# Patient Record
Sex: Male | Born: 1960 | Race: White | Hispanic: No | Marital: Single | Smoking: Never smoker
Health system: Southern US, Community
[De-identification: ages and names within clinical notes are randomized; demographics above are authoritative.]

## PROBLEM LIST (undated history)

## (undated) DIAGNOSIS — K219 Gastro-esophageal reflux disease without esophagitis: Secondary | ICD-10-CM

## (undated) DIAGNOSIS — E119 Type 2 diabetes mellitus without complications: Secondary | ICD-10-CM

## (undated) DIAGNOSIS — I469 Cardiac arrest, cause unspecified: Secondary | ICD-10-CM

## (undated) DIAGNOSIS — N189 Chronic kidney disease, unspecified: Secondary | ICD-10-CM

## (undated) DIAGNOSIS — I10 Essential (primary) hypertension: Secondary | ICD-10-CM

## (undated) DIAGNOSIS — I25810 Atherosclerosis of coronary artery bypass graft(s) without angina pectoris: Secondary | ICD-10-CM

## (undated) DIAGNOSIS — I509 Heart failure, unspecified: Secondary | ICD-10-CM

## (undated) DIAGNOSIS — J449 Chronic obstructive pulmonary disease, unspecified: Secondary | ICD-10-CM

## (undated) DIAGNOSIS — N179 Acute kidney failure, unspecified: Secondary | ICD-10-CM

---

## 1998-02-07 ENCOUNTER — Encounter: Admission: RE | Admit: 1998-02-07 | Discharge: 1998-02-07 | Payer: Self-pay | Admitting: Internal Medicine

## 1998-07-31 ENCOUNTER — Encounter: Admission: RE | Admit: 1998-07-31 | Discharge: 1998-07-31 | Payer: Self-pay | Admitting: Internal Medicine

## 1998-07-31 ENCOUNTER — Ambulatory Visit (HOSPITAL_COMMUNITY): Admission: RE | Admit: 1998-07-31 | Discharge: 1998-07-31 | Payer: Self-pay | Admitting: Internal Medicine

## 1998-09-18 ENCOUNTER — Ambulatory Visit (HOSPITAL_COMMUNITY): Admission: RE | Admit: 1998-09-18 | Discharge: 1998-09-18 | Payer: Self-pay | Admitting: Internal Medicine

## 1998-09-18 ENCOUNTER — Encounter: Payer: Self-pay | Admitting: Internal Medicine

## 1998-09-18 ENCOUNTER — Encounter: Admission: RE | Admit: 1998-09-18 | Discharge: 1998-09-18 | Payer: Self-pay | Admitting: Internal Medicine

## 1998-11-28 ENCOUNTER — Emergency Department (HOSPITAL_COMMUNITY): Admission: EM | Admit: 1998-11-28 | Discharge: 1998-11-28 | Payer: Self-pay | Admitting: Emergency Medicine

## 1998-12-05 ENCOUNTER — Encounter: Admission: RE | Admit: 1998-12-05 | Discharge: 1998-12-05 | Payer: Self-pay | Admitting: Internal Medicine

## 1999-05-30 ENCOUNTER — Encounter: Admission: RE | Admit: 1999-05-30 | Discharge: 1999-05-30 | Payer: Self-pay | Admitting: Hematology and Oncology

## 1999-11-29 ENCOUNTER — Emergency Department (HOSPITAL_COMMUNITY): Admission: EM | Admit: 1999-11-29 | Discharge: 1999-11-29 | Payer: Self-pay | Admitting: Emergency Medicine

## 2000-11-26 ENCOUNTER — Emergency Department (HOSPITAL_COMMUNITY): Admission: EM | Admit: 2000-11-26 | Discharge: 2000-11-26 | Payer: Self-pay

## 2000-12-19 ENCOUNTER — Emergency Department (HOSPITAL_COMMUNITY): Admission: EM | Admit: 2000-12-19 | Discharge: 2000-12-19 | Payer: Self-pay | Admitting: Emergency Medicine

## 2001-04-11 ENCOUNTER — Emergency Department (HOSPITAL_COMMUNITY): Admission: EM | Admit: 2001-04-11 | Discharge: 2001-04-11 | Payer: Self-pay | Admitting: *Deleted

## 2001-05-01 ENCOUNTER — Emergency Department (HOSPITAL_COMMUNITY): Admission: EM | Admit: 2001-05-01 | Discharge: 2001-05-01 | Payer: Self-pay | Admitting: Emergency Medicine

## 2001-05-10 ENCOUNTER — Emergency Department (HOSPITAL_COMMUNITY): Admission: EM | Admit: 2001-05-10 | Discharge: 2001-05-10 | Payer: Self-pay | Admitting: Emergency Medicine

## 2001-06-22 ENCOUNTER — Emergency Department (HOSPITAL_COMMUNITY): Admission: EM | Admit: 2001-06-22 | Discharge: 2001-06-22 | Payer: Self-pay | Admitting: Emergency Medicine

## 2003-01-05 ENCOUNTER — Encounter: Admission: RE | Admit: 2003-01-05 | Discharge: 2003-01-05 | Payer: Self-pay | Admitting: Urology

## 2003-01-05 ENCOUNTER — Encounter: Payer: Self-pay | Admitting: Urology

## 2003-01-06 ENCOUNTER — Ambulatory Visit (HOSPITAL_BASED_OUTPATIENT_CLINIC_OR_DEPARTMENT_OTHER): Admission: RE | Admit: 2003-01-06 | Discharge: 2003-01-06 | Payer: Self-pay | Admitting: Urology

## 2003-01-06 ENCOUNTER — Encounter: Payer: Self-pay | Admitting: Urology

## 2003-06-24 ENCOUNTER — Emergency Department (HOSPITAL_COMMUNITY): Admission: EM | Admit: 2003-06-24 | Discharge: 2003-06-25 | Payer: Self-pay | Admitting: Emergency Medicine

## 2003-06-25 ENCOUNTER — Encounter: Payer: Self-pay | Admitting: Emergency Medicine

## 2004-12-20 ENCOUNTER — Ambulatory Visit (HOSPITAL_COMMUNITY): Admission: RE | Admit: 2004-12-20 | Discharge: 2004-12-21 | Payer: Self-pay | Admitting: Neurological Surgery

## 2005-01-15 ENCOUNTER — Encounter: Admission: RE | Admit: 2005-01-15 | Discharge: 2005-01-15 | Payer: Self-pay | Admitting: Neurological Surgery

## 2005-01-18 ENCOUNTER — Emergency Department (HOSPITAL_COMMUNITY): Admission: EM | Admit: 2005-01-18 | Discharge: 2005-01-18 | Payer: Self-pay | Admitting: Emergency Medicine

## 2005-02-12 ENCOUNTER — Encounter: Admission: RE | Admit: 2005-02-12 | Discharge: 2005-02-12 | Payer: Self-pay | Admitting: Neurological Surgery

## 2012-06-30 ENCOUNTER — Other Ambulatory Visit: Payer: Self-pay | Admitting: Family Medicine

## 2014-07-11 ENCOUNTER — Inpatient Hospital Stay: Admit: 2014-07-11 | Payer: Self-pay | Admitting: Internal Medicine

## 2014-07-11 ENCOUNTER — Inpatient Hospital Stay (HOSPITAL_COMMUNITY)
Admission: EM | Admit: 2014-07-11 | Discharge: 2014-07-26 | DRG: 004 | Disposition: A | Discharge status: Rehabilitation Facility | Payer: Medicaid Other | Source: Other Acute Inpatient Hospital | Attending: Pulmonary Disease | Admitting: Pulmonary Disease

## 2014-07-11 ENCOUNTER — Inpatient Hospital Stay (HOSPITAL_COMMUNITY): Payer: Medicaid Other

## 2014-07-11 ENCOUNTER — Encounter (HOSPITAL_COMMUNITY): Payer: Self-pay | Admitting: Internal Medicine

## 2014-07-11 DIAGNOSIS — J189 Pneumonia, unspecified organism: Secondary | ICD-10-CM | POA: Diagnosis not present

## 2014-07-11 DIAGNOSIS — E785 Hyperlipidemia, unspecified: Secondary | ICD-10-CM | POA: Diagnosis not present

## 2014-07-11 DIAGNOSIS — R570 Cardiogenic shock: Secondary | ICD-10-CM | POA: Diagnosis not present

## 2014-07-11 DIAGNOSIS — Z951 Presence of aortocoronary bypass graft: Secondary | ICD-10-CM

## 2014-07-11 DIAGNOSIS — D649 Anemia, unspecified: Secondary | ICD-10-CM | POA: Diagnosis not present

## 2014-07-11 DIAGNOSIS — Z430 Encounter for attention to tracheostomy: Secondary | ICD-10-CM

## 2014-07-11 DIAGNOSIS — J9600 Acute respiratory failure, unspecified whether with hypoxia or hypercapnia: Secondary | ICD-10-CM | POA: Diagnosis not present

## 2014-07-11 DIAGNOSIS — I2510 Atherosclerotic heart disease of native coronary artery without angina pectoris: Secondary | ICD-10-CM | POA: Diagnosis not present

## 2014-07-11 DIAGNOSIS — Z6841 Body Mass Index (BMI) 40.0 and over, adult: Secondary | ICD-10-CM

## 2014-07-11 DIAGNOSIS — Z8249 Family history of ischemic heart disease and other diseases of the circulatory system: Secondary | ICD-10-CM

## 2014-07-11 DIAGNOSIS — I5031 Acute diastolic (congestive) heart failure: Secondary | ICD-10-CM

## 2014-07-11 DIAGNOSIS — E108 Type 1 diabetes mellitus with unspecified complications: Secondary | ICD-10-CM

## 2014-07-11 DIAGNOSIS — K219 Gastro-esophageal reflux disease without esophagitis: Secondary | ICD-10-CM

## 2014-07-11 DIAGNOSIS — G9341 Metabolic encephalopathy: Secondary | ICD-10-CM | POA: Diagnosis not present

## 2014-07-11 DIAGNOSIS — I10 Essential (primary) hypertension: Secondary | ICD-10-CM

## 2014-07-11 DIAGNOSIS — I509 Heart failure, unspecified: Secondary | ICD-10-CM

## 2014-07-11 DIAGNOSIS — E1149 Type 2 diabetes mellitus with other diabetic neurological complication: Secondary | ICD-10-CM | POA: Diagnosis not present

## 2014-07-11 DIAGNOSIS — R4182 Altered mental status, unspecified: Secondary | ICD-10-CM | POA: Diagnosis present

## 2014-07-11 DIAGNOSIS — I469 Cardiac arrest, cause unspecified: Secondary | ICD-10-CM

## 2014-07-11 DIAGNOSIS — I129 Hypertensive chronic kidney disease with stage 1 through stage 4 chronic kidney disease, or unspecified chronic kidney disease: Secondary | ICD-10-CM | POA: Diagnosis present

## 2014-07-11 DIAGNOSIS — N182 Chronic kidney disease, stage 2 (mild): Secondary | ICD-10-CM | POA: Diagnosis not present

## 2014-07-11 DIAGNOSIS — E1169 Type 2 diabetes mellitus with other specified complication: Secondary | ICD-10-CM | POA: Diagnosis present

## 2014-07-11 DIAGNOSIS — I252 Old myocardial infarction: Secondary | ICD-10-CM

## 2014-07-11 DIAGNOSIS — I4901 Ventricular fibrillation: Secondary | ICD-10-CM | POA: Diagnosis not present

## 2014-07-11 DIAGNOSIS — I2789 Other specified pulmonary heart diseases: Secondary | ICD-10-CM | POA: Diagnosis not present

## 2014-07-11 DIAGNOSIS — G9340 Encephalopathy, unspecified: Secondary | ICD-10-CM

## 2014-07-11 DIAGNOSIS — E119 Type 2 diabetes mellitus without complications: Secondary | ICD-10-CM

## 2014-07-11 DIAGNOSIS — F17200 Nicotine dependence, unspecified, uncomplicated: Secondary | ICD-10-CM | POA: Diagnosis present

## 2014-07-11 DIAGNOSIS — I5021 Acute systolic (congestive) heart failure: Secondary | ICD-10-CM

## 2014-07-11 DIAGNOSIS — E139 Other specified diabetes mellitus without complications: Secondary | ICD-10-CM

## 2014-07-11 DIAGNOSIS — R1310 Dysphagia, unspecified: Secondary | ICD-10-CM | POA: Diagnosis not present

## 2014-07-11 DIAGNOSIS — Z794 Long term (current) use of insulin: Secondary | ICD-10-CM

## 2014-07-11 DIAGNOSIS — N189 Chronic kidney disease, unspecified: Secondary | ICD-10-CM

## 2014-07-11 DIAGNOSIS — N179 Acute kidney failure, unspecified: Secondary | ICD-10-CM | POA: Diagnosis present

## 2014-07-11 DIAGNOSIS — I25810 Atherosclerosis of coronary artery bypass graft(s) without angina pectoris: Secondary | ICD-10-CM

## 2014-07-11 DIAGNOSIS — E875 Hyperkalemia: Secondary | ICD-10-CM | POA: Diagnosis not present

## 2014-07-11 DIAGNOSIS — E669 Obesity, unspecified: Secondary | ICD-10-CM | POA: Diagnosis present

## 2014-07-11 DIAGNOSIS — R0681 Apnea, not elsewhere classified: Secondary | ICD-10-CM | POA: Diagnosis not present

## 2014-07-11 DIAGNOSIS — J9601 Acute respiratory failure with hypoxia: Secondary | ICD-10-CM

## 2014-07-11 DIAGNOSIS — J449 Chronic obstructive pulmonary disease, unspecified: Secondary | ICD-10-CM

## 2014-07-11 DIAGNOSIS — R061 Stridor: Secondary | ICD-10-CM

## 2014-07-11 DIAGNOSIS — J438 Other emphysema: Secondary | ICD-10-CM

## 2014-07-11 DIAGNOSIS — J4489 Other specified chronic obstructive pulmonary disease: Secondary | ICD-10-CM | POA: Diagnosis present

## 2014-07-11 DIAGNOSIS — T8589XD Other specified complication of internal prosthetic devices, implants and grafts, not elsewhere classified, subsequent encounter: Secondary | ICD-10-CM

## 2014-07-11 DIAGNOSIS — IMO0001 Reserved for inherently not codable concepts without codable children: Secondary | ICD-10-CM

## 2014-07-11 DIAGNOSIS — E870 Hyperosmolality and hypernatremia: Secondary | ICD-10-CM

## 2014-07-11 HISTORY — DX: Gastro-esophageal reflux disease without esophagitis: K21.9

## 2014-07-11 HISTORY — DX: Type 2 diabetes mellitus without complications: E11.9

## 2014-07-11 HISTORY — DX: Chronic kidney disease, unspecified: N18.9

## 2014-07-11 HISTORY — DX: Heart failure, unspecified: I50.9

## 2014-07-11 HISTORY — DX: Essential (primary) hypertension: I10

## 2014-07-11 HISTORY — DX: Atherosclerosis of coronary artery bypass graft(s) without angina pectoris: I25.810

## 2014-07-11 HISTORY — DX: Chronic obstructive pulmonary disease, unspecified: J44.9

## 2014-07-11 HISTORY — DX: Acute kidney failure, unspecified: N17.9

## 2014-07-11 HISTORY — DX: Cardiac arrest, cause unspecified: I46.9

## 2014-07-11 LAB — CBC
HEMATOCRIT: 42.7 % (ref 39.0–52.0)
HEMATOCRIT: 43.7 % (ref 39.0–52.0)
HEMOGLOBIN: 13.5 g/dL (ref 13.0–17.0)
Hemoglobin: 13.8 g/dL (ref 13.0–17.0)
MCH: 28.3 pg (ref 26.0–34.0)
MCH: 28.7 pg (ref 26.0–34.0)
MCHC: 31.6 g/dL (ref 30.0–36.0)
MCHC: 31.6 g/dL (ref 30.0–36.0)
MCV: 89.5 fL (ref 78.0–100.0)
MCV: 90.9 fL (ref 78.0–100.0)
Platelets: 389 10*3/uL (ref 150–400)
Platelets: 374 10*3/uL (ref 150–400)
RBC: 4.77 MIL/uL (ref 4.22–5.81)
RBC: 4.81 MIL/uL (ref 4.22–5.81)
RDW: 17.5 % — ABNORMAL HIGH (ref 11.5–15.5)
RDW: 17.7 % — ABNORMAL HIGH (ref 11.5–15.5)
WBC: 13.8 10*3/uL — ABNORMAL HIGH (ref 4.0–10.5)
WBC: 19 10*3/uL — ABNORMAL HIGH (ref 4.0–10.5)

## 2014-07-11 LAB — URINALYSIS, ROUTINE W REFLEX MICROSCOPIC
Bilirubin Urine: NEGATIVE
GLUCOSE, UA: NEGATIVE mg/dL
KETONES UR: NEGATIVE mg/dL
Nitrite: NEGATIVE
PH: 5 (ref 5.0–8.0)
Protein, ur: 30 mg/dL — AB
Specific Gravity, Urine: 1.021 (ref 1.005–1.030)
Urobilinogen, UA: 0.2 mg/dL (ref 0.0–1.0)

## 2014-07-11 LAB — AMMONIA: AMMONIA: 39 umol/L (ref 11–60)

## 2014-07-11 LAB — BASIC METABOLIC PANEL
ANION GAP: 19 — AB (ref 5–15)
Anion gap: 17 — ABNORMAL HIGH (ref 5–15)
Anion gap: 20 — ABNORMAL HIGH (ref 5–15)
BUN: 85 mg/dL — AB (ref 6–23)
BUN: 83 mg/dL — ABNORMAL HIGH (ref 6–23)
BUN: 83 mg/dL — AB (ref 6–23)
CALCIUM: 7.7 mg/dL — AB (ref 8.4–10.5)
CO2: 24 meq/L (ref 19–32)
CO2: 21 meq/L (ref 19–32)
CO2: 22 meq/L (ref 19–32)
CREATININE: 4.74 mg/dL — AB (ref 0.50–1.35)
CREATININE: 4.87 mg/dL — AB (ref 0.50–1.35)
Calcium: 7.7 mg/dL — ABNORMAL LOW (ref 8.4–10.5)
Calcium: 7.7 mg/dL — ABNORMAL LOW (ref 8.4–10.5)
Chloride: 96 mEq/L (ref 96–112)
Chloride: 95 mEq/L — ABNORMAL LOW (ref 96–112)
Chloride: 98 mEq/L (ref 96–112)
Creatinine, Ser: 5.29 mg/dL — ABNORMAL HIGH (ref 0.50–1.35)
GFR calc Af Amer: 13 mL/min — ABNORMAL LOW (ref >90)
GFR calc Af Amer: 15 mL/min — ABNORMAL LOW (ref >90)
GFR calc non Af Amer: 13 mL/min — ABNORMAL LOW (ref >90)
GFR calc non Af Amer: 12 mL/min — ABNORMAL LOW (ref >90)
GFR, EST AFRICAN AMERICAN: 14 mL/min — AB (ref >90)
GFR, EST NON AFRICAN AMERICAN: 11 mL/min — AB (ref >90)
GLUCOSE: 162 mg/dL — AB (ref 70–99)
Glucose, Bld: 221 mg/dL — ABNORMAL HIGH (ref 70–99)
Glucose, Bld: 192 mg/dL — ABNORMAL HIGH (ref 70–99)
Potassium: 5.9 mEq/L — ABNORMAL HIGH (ref 3.7–5.3)
Potassium: 5.9 mEq/L — ABNORMAL HIGH (ref 3.7–5.3)
Potassium: 5.2 mEq/L (ref 3.7–5.3)
SODIUM: 137 meq/L (ref 137–147)
Sodium: 136 mEq/L — ABNORMAL LOW (ref 137–147)
Sodium: 139 mEq/L (ref 137–147)

## 2014-07-11 LAB — GLUCOSE, CAPILLARY
GLUCOSE-CAPILLARY: 206 mg/dL — AB (ref 70–99)
Glucose-Capillary: 166 mg/dL — ABNORMAL HIGH (ref 70–99)
Glucose-Capillary: 212 mg/dL — ABNORMAL HIGH (ref 70–99)
Glucose-Capillary: 173 mg/dL — ABNORMAL HIGH (ref 70–99)

## 2014-07-11 LAB — COMPREHENSIVE METABOLIC PANEL
ALT: 39 U/L (ref 0–53)
AST: 24 U/L (ref 0–37)
Albumin: 3.4 g/dL — ABNORMAL LOW (ref 3.5–5.2)
Alkaline Phosphatase: 113 U/L (ref 39–117)
Anion gap: 19 — ABNORMAL HIGH (ref 5–15)
BUN: 82 mg/dL — ABNORMAL HIGH (ref 6–23)
CALCIUM: 8.9 mg/dL (ref 8.4–10.5)
CO2: 24 meq/L (ref 19–32)
CREATININE: 5.33 mg/dL — AB (ref 0.50–1.35)
Chloride: 94 mEq/L — ABNORMAL LOW (ref 96–112)
GFR, EST AFRICAN AMERICAN: 13 mL/min — AB (ref >90)
GFR, EST NON AFRICAN AMERICAN: 11 mL/min — AB (ref >90)
Glucose, Bld: 232 mg/dL — ABNORMAL HIGH (ref 70–99)
Potassium: 4.7 mEq/L (ref 3.7–5.3)
Sodium: 137 mEq/L (ref 137–147)
Total Bilirubin: 0.3 mg/dL (ref 0.3–1.2)
Total Protein: 6.5 g/dL (ref 6.0–8.3)

## 2014-07-11 LAB — LACTIC ACID, PLASMA: Lactic Acid, Venous: 3.1 mmol/L — ABNORMAL HIGH (ref 0.5–2.2)

## 2014-07-11 LAB — POCT I-STAT 3, ART BLOOD GAS (G3+)
ACID-BASE DEFICIT: 5 mmol/L — AB (ref 0.0–2.0)
BICARBONATE: 21.9 meq/L (ref 20.0–24.0)
O2 Saturation: 99 %
PO2 ART: 181 mmHg — AB (ref 80.0–100.0)
Patient temperature: 98.6
TCO2: 23 mmol/L (ref 0–100)
pCO2 arterial: 46.9 mmHg — ABNORMAL HIGH (ref 35.0–45.0)
pH, Arterial: 7.278 — ABNORMAL LOW (ref 7.350–7.450)

## 2014-07-11 LAB — URINE MICROSCOPIC-ADD ON

## 2014-07-11 LAB — CARBOXYHEMOGLOBIN
Carboxyhemoglobin: 1.9 % — ABNORMAL HIGH (ref 0.5–1.5)
METHEMOGLOBIN: 0.9 % (ref 0.0–1.5)
O2 Saturation: 92 %
TOTAL HEMOGLOBIN: 13.6 g/dL (ref 13.5–18.0)

## 2014-07-11 LAB — PROTIME-INR
INR: 1.1 (ref 0.00–1.49)
Prothrombin Time: 14.2 seconds (ref 11.6–15.2)

## 2014-07-11 LAB — MRSA PCR SCREENING: MRSA BY PCR: NEGATIVE

## 2014-07-11 LAB — POTASSIUM: POTASSIUM: 5.9 meq/L — AB (ref 3.7–5.3)

## 2014-07-11 LAB — TROPONIN I
TROPONIN I: 0.43 ng/mL — AB (ref <0.30)
Troponin I: 0.64 ng/mL (ref <0.30)
Troponin I: 1.28 ng/mL (ref <0.30)

## 2014-07-11 LAB — APTT: APTT: 38 s — AB (ref 24–37)

## 2014-07-11 LAB — PROCALCITONIN: Procalcitonin: 0.42 ng/mL

## 2014-07-11 MED ORDER — CETYLPYRIDINIUM CHLORIDE 0.05 % MT LIQD: 7.0000 mL | Freq: Four times a day (QID) | OROMUCOSAL | Status: DC

## 2014-07-11 MED ORDER — FENTANYL BOLUS VIA INFUSION
50.0000 ug | INTRAVENOUS | Status: DC | PRN
  Filled 2014-07-11: qty 50

## 2014-07-11 MED ORDER — ATORVASTATIN CALCIUM 20 MG PO TABS
20.0000 mg | ORAL_TABLET | Freq: Every day | ORAL | Status: DC
  Administered 2014-07-11 – 2014-07-14 (×4): 20 mg
  Filled 2014-07-11 (×5): qty 1

## 2014-07-11 MED ORDER — SODIUM CHLORIDE 0.9 % IV SOLN
25.0000 ug/h | INTRAVENOUS | Status: DC
  Administered 2014-07-11 – 2014-07-14 (×2): 50 ug/h via INTRAVENOUS
  Filled 2014-07-11 (×3): qty 50

## 2014-07-11 MED ORDER — FLUTICASONE PROPIONATE HFA 110 MCG/ACT IN AERO
2.0000 | INHALATION_SPRAY | Freq: Two times a day (BID) | RESPIRATORY_TRACT | Status: DC
  Filled 2014-07-11: qty 12

## 2014-07-11 MED ORDER — ALBUTEROL SULFATE (2.5 MG/3ML) 0.083% IN NEBU
2.5000 mg | INHALATION_SOLUTION | Freq: Four times a day (QID) | RESPIRATORY_TRACT | Status: DC
  Administered 2014-07-11 – 2014-07-20 (×36): 2.5 mg via RESPIRATORY_TRACT
  Filled 2014-07-11 (×36): qty 3

## 2014-07-11 MED ORDER — SODIUM CHLORIDE 0.9 % IV SOLN: 250.0000 mL | INTRAVENOUS | Status: DC | PRN

## 2014-07-11 MED ORDER — DOPAMINE-DEXTROSE 3.2-5 MG/ML-% IV SOLN
2.0000 ug/kg/min | INTRAVENOUS | Status: DC
  Administered 2014-07-11: 10 ug/kg/min via INTRAVENOUS
  Administered 2014-07-12: 4.5 ug/kg/min via INTRAVENOUS
  Filled 2014-07-11: qty 250

## 2014-07-11 MED ORDER — INSULIN ASPART 100 UNIT/ML ~~LOC~~ SOLN
0.0000 [IU] | Freq: Three times a day (TID) | SUBCUTANEOUS | Status: DC
  Administered 2014-07-11: 4 [IU] via SUBCUTANEOUS

## 2014-07-11 MED ORDER — FUROSEMIDE 10 MG/ML IJ SOLN: 80.0000 mg | Freq: Once | INTRAMUSCULAR | Status: DC

## 2014-07-11 MED ORDER — HEPARIN SODIUM (PORCINE) 5000 UNIT/ML IJ SOLN: 5000.0000 [IU] | Freq: Three times a day (TID) | INTRAMUSCULAR | Status: DC

## 2014-07-11 MED ORDER — DOPAMINE-DEXTROSE 3.2-5 MG/ML-% IV SOLN
INTRAVENOUS | Status: AC
  Administered 2014-07-11: 10 ug/kg/min via INTRAVENOUS
  Filled 2014-07-11: qty 250

## 2014-07-11 MED ORDER — ACETAMINOPHEN 325 MG PO TABS: 650.0000 mg | ORAL_TABLET | Freq: Four times a day (QID) | ORAL | Status: DC | PRN

## 2014-07-11 MED ORDER — CHLORHEXIDINE GLUCONATE 0.12 % MT SOLN: 15.0000 mL | Freq: Two times a day (BID) | OROMUCOSAL | Status: DC

## 2014-07-11 MED ORDER — SODIUM POLYSTYRENE SULFONATE 15 GM/60ML PO SUSP
30.0000 g | Freq: Once | ORAL | Status: AC
  Administered 2014-07-11: 30 g
  Filled 2014-07-11: qty 120

## 2014-07-11 MED ORDER — FENTANYL CITRATE 0.05 MG/ML IJ SOLN: 100.0000 ug | Freq: Once | INTRAMUSCULAR | Status: AC | PRN

## 2014-07-11 MED ORDER — FUROSEMIDE 40 MG PO TABS
40.0000 mg | ORAL_TABLET | Freq: Two times a day (BID) | ORAL | Status: DC
  Filled 2014-07-11 (×3): qty 1

## 2014-07-11 MED ORDER — SODIUM CHLORIDE 0.9 % IV SOLN
INTRAVENOUS | Status: DC
  Administered 2014-07-11: 10 mL/h via INTRAVENOUS
  Administered 2014-07-15: 20 mL/h via INTRAVENOUS
  Administered 2014-07-16 – 2014-07-17 (×2): 15 mL/h via INTRAVENOUS

## 2014-07-11 MED ORDER — DEXTROSE 5 % IV SOLN
1.0000 g | INTRAVENOUS | Status: AC
  Administered 2014-07-11 – 2014-07-18 (×8): 1 g via INTRAVENOUS
  Filled 2014-07-11 (×8): qty 10

## 2014-07-11 MED ORDER — DOXYCYCLINE HYCLATE 100 MG IV SOLR
100.0000 mg | Freq: Two times a day (BID) | INTRAVENOUS | Status: DC
  Administered 2014-07-11 – 2014-07-15 (×9): 100 mg via INTRAVENOUS
  Filled 2014-07-11 (×10): qty 100

## 2014-07-11 MED ORDER — ASPIRIN EC 81 MG PO TBEC
81.0000 mg | DELAYED_RELEASE_TABLET | Freq: Every day | ORAL | Status: DC
  Filled 2014-07-11: qty 1

## 2014-07-11 MED ORDER — SODIUM CHLORIDE 0.9 % IV SOLN
2000.0000 mL | Freq: Once | INTRAVENOUS | Status: AC
  Administered 2014-07-11: 1000 mL via INTRAVENOUS

## 2014-07-11 MED ORDER — FENTANYL CITRATE 0.05 MG/ML IJ SOLN
INTRAMUSCULAR | Status: AC
  Administered 2014-07-11: 100 ug via INTRAVENOUS
  Filled 2014-07-11: qty 2

## 2014-07-11 MED ORDER — CHLORHEXIDINE GLUCONATE 0.12 % MT SOLN
15.0000 mL | Freq: Two times a day (BID) | OROMUCOSAL | Status: DC
  Administered 2014-07-11 – 2014-07-26 (×31): 15 mL via OROMUCOSAL
  Filled 2014-07-11 (×35): qty 15

## 2014-07-11 MED ORDER — SODIUM CHLORIDE 0.9 % IJ SOLN: 3.0000 mL | Freq: Two times a day (BID) | INTRAMUSCULAR | Status: DC

## 2014-07-11 MED ORDER — MIDAZOLAM BOLUS VIA INFUSION
2.0000 mg | INTRAVENOUS | Status: DC | PRN
  Filled 2014-07-11: qty 2

## 2014-07-11 MED ORDER — AMLODIPINE BESYLATE 10 MG PO TABS
10.0000 mg | ORAL_TABLET | Freq: Every day | ORAL | Status: DC
  Filled 2014-07-11: qty 1

## 2014-07-11 MED ORDER — SODIUM CHLORIDE 0.9 % IV SOLN: 25.0000 ug/h | INTRAVENOUS | Status: DC

## 2014-07-11 MED ORDER — MIDAZOLAM HCL 5 MG/ML IJ SOLN: 2.0000 mg | Freq: Once | INTRAMUSCULAR | Status: AC | PRN

## 2014-07-11 MED ORDER — CETYLPYRIDINIUM CHLORIDE 0.05 % MT LIQD
7.0000 mL | Freq: Four times a day (QID) | OROMUCOSAL | Status: DC
  Administered 2014-07-11 – 2014-07-26 (×62): 7 mL via OROMUCOSAL

## 2014-07-11 MED ORDER — ACETAMINOPHEN 650 MG RE SUPP: 650.0000 mg | Freq: Four times a day (QID) | RECTAL | Status: DC | PRN

## 2014-07-11 MED ORDER — ARTIFICIAL TEARS OP OINT: 1.0000 "application " | TOPICAL_OINTMENT | Freq: Three times a day (TID) | OPHTHALMIC | Status: DC

## 2014-07-11 MED ORDER — INSULIN GLARGINE 100 UNIT/ML ~~LOC~~ SOLN
50.0000 [IU] | Freq: Two times a day (BID) | SUBCUTANEOUS | Status: DC
  Filled 2014-07-11 (×2): qty 0.5

## 2014-07-11 MED ORDER — LEVOFLOXACIN IN D5W 750 MG/150ML IV SOLN
750.0000 mg | Freq: Once | INTRAVENOUS | Status: DC
  Filled 2014-07-11: qty 150

## 2014-07-11 MED ORDER — HEPARIN SODIUM (PORCINE) 5000 UNIT/ML IJ SOLN
5000.0000 [IU] | Freq: Three times a day (TID) | INTRAMUSCULAR | Status: DC
  Administered 2014-07-11 – 2014-07-16 (×14): 5000 [IU] via SUBCUTANEOUS
  Filled 2014-07-11 (×18): qty 1

## 2014-07-11 MED ORDER — INSULIN ASPART 100 UNIT/ML ~~LOC~~ SOLN
0.0000 [IU] | SUBCUTANEOUS | Status: DC
  Administered 2014-07-11: 3 [IU] via SUBCUTANEOUS
  Administered 2014-07-11 (×2): 5 [IU] via SUBCUTANEOUS
  Administered 2014-07-12 (×5): 3 [IU] via SUBCUTANEOUS
  Administered 2014-07-12: 8 [IU] via SUBCUTANEOUS
  Administered 2014-07-12: 5 [IU] via SUBCUTANEOUS
  Administered 2014-07-13: 8 [IU] via SUBCUTANEOUS

## 2014-07-11 MED ORDER — SODIUM CHLORIDE 0.9 % IV SOLN: 1.0000 mg/h | INTRAVENOUS | Status: DC

## 2014-07-11 MED ORDER — TIOTROPIUM BROMIDE MONOHYDRATE 18 MCG IN CAPS
18.0000 ug | ORAL_CAPSULE | Freq: Every day | RESPIRATORY_TRACT | Status: DC
  Filled 2014-07-11: qty 5

## 2014-07-11 MED ORDER — SODIUM CHLORIDE 0.9 % IJ SOLN: 3.0000 mL | INTRAMUSCULAR | Status: DC | PRN

## 2014-07-11 MED ORDER — SODIUM CHLORIDE 0.9 % IV SOLN
1.0000 ug/kg/min | INTRAVENOUS | Status: DC
  Filled 2014-07-11: qty 20

## 2014-07-11 MED ORDER — ATORVASTATIN CALCIUM 20 MG PO TABS
20.0000 mg | ORAL_TABLET | Freq: Every day | ORAL | Status: DC
  Filled 2014-07-11: qty 1

## 2014-07-11 MED ORDER — SODIUM CHLORIDE 0.9 % IV SOLN
1.0000 mg/h | INTRAVENOUS | Status: DC
  Filled 2014-07-11: qty 10

## 2014-07-11 MED ORDER — LEVOFLOXACIN IN D5W 750 MG/150ML IV SOLN
750.0000 mg | INTRAVENOUS | Status: DC
  Filled 2014-07-11: qty 150

## 2014-07-11 MED ORDER — HEPARIN SODIUM (PORCINE) 5000 UNIT/ML IJ SOLN
5000.0000 [IU] | Freq: Three times a day (TID) | INTRAMUSCULAR | Status: DC
  Administered 2014-07-11: 5000 [IU] via SUBCUTANEOUS
  Filled 2014-07-11: qty 1

## 2014-07-11 MED ORDER — CISATRACURIUM BOLUS VIA INFUSION
0.0500 mg/kg | INTRAVENOUS | Status: DC | PRN
Start: 2014-07-11 — End: 2014-07-11
  Filled 2014-07-11: qty 6

## 2014-07-11 MED ORDER — ASPIRIN 300 MG RE SUPP
300.0000 mg | RECTAL | Status: AC
  Administered 2014-07-11: 300 mg via RECTAL
  Filled 2014-07-11: qty 1

## 2014-07-11 MED ORDER — PANTOPRAZOLE SODIUM 40 MG IV SOLR
40.0000 mg | Freq: Every day | INTRAVENOUS | Status: DC
  Administered 2014-07-11 – 2014-07-16 (×6): 40 mg via INTRAVENOUS
  Filled 2014-07-11 (×8): qty 40

## 2014-07-11 MED ORDER — NOREPINEPHRINE BITARTRATE 1 MG/ML IV SOLN
0.5000 ug/min | INTRAVENOUS | Status: DC
  Administered 2014-07-11: 2 ug/min via INTRAVENOUS
  Filled 2014-07-11: qty 4

## 2014-07-11 MED ORDER — MIDAZOLAM HCL 2 MG/2ML IJ SOLN: 2.0000 mg | Freq: Once | INTRAMUSCULAR | Status: DC

## 2014-07-11 MED ORDER — METOPROLOL SUCCINATE ER 50 MG PO TB24
50.0000 mg | ORAL_TABLET | Freq: Every day | ORAL | Status: DC
  Filled 2014-07-11: qty 1

## 2014-07-11 MED ORDER — PREGABALIN 75 MG PO CAPS: 75.0000 mg | ORAL_CAPSULE | Freq: Three times a day (TID) | ORAL | Status: DC

## 2014-07-11 MED ORDER — FENTANYL CITRATE 0.05 MG/ML IJ SOLN
100.0000 ug | Freq: Once | INTRAMUSCULAR | Status: AC
  Administered 2014-07-11: 100 ug via INTRAVENOUS

## 2014-07-11 MED ORDER — CISATRACURIUM BOLUS VIA INFUSION
0.1000 mg/kg | Freq: Once | INTRAVENOUS | Status: DC
  Filled 2014-07-11: qty 11

## 2014-07-11 MED ORDER — GEMFIBROZIL 600 MG PO TABS
600.0000 mg | ORAL_TABLET | Freq: Two times a day (BID) | ORAL | Status: DC
  Filled 2014-07-11 (×3): qty 1

## 2014-07-11 MED FILL — Medication: Qty: 1 | Status: AC

## 2014-07-11 NOTE — Progress Notes (Addendum)
Pt down in Echo. Call received from central tele about pt rhythm asystole. Call from echo lab stating they were calling a code on the pt. Nursing went to assist in code.

## 2014-07-11 NOTE — Progress Notes (Signed)
07/11/14 0900  Clinical Encounter Type  Visited With Patient;Health care provider  Visit Type Code  Spiritual Encounters  Spiritual Needs Emotional  Stress Factors  Patient Stress Factors None identified   Chaplain responded to code blue at roughly 9:00 AM. When chaplain arrived, patient was being worked on extensively by medical team. Chaplain provided emotional support to medical team. Patient was not able to communicate at this time. Chaplain spoke to patient's primary RN at the time. She indicated that she had tried to reach patient's primary contact by phone but was unsuccessful. Chaplain made a visit to room where patient was transferred. Chaplain made a second attempt to contact patient's primary contact with no success. Chaplain will follow-up with patient when appropriate. Delma Freeze R 9:30 AM

## 2014-07-11 NOTE — Progress Notes (Signed)
PT Cancellation Note  Patient Details Name: Richard [name] MRN: [id number] DOB: [birth year]   Cancelled Treatment:   PT attempted to see pt, who is extremely lethargic and unable to stay awake to sit up.  Nsg has just received him and may try again later today.     Ivar Drape 07/11/2014, 8:33 AM  Samul Dada, PT MS Acute Rehab Dept. Number: [phone]

## 2014-07-11 NOTE — Progress Notes (Signed)
  Code Blue Note.  Responded to Code Blue in Echo lab.   On my arrival, patient apneic and pulseless. CPR in progress. On monitor initial rhythm I saw was VF. He received multiple shocks  and 3 doses of epinephrine, 2 bicarbonate, calcium and amiodarone 300. Was transiently in asystole and then VT for which he was shocked again. He had continous CPR. RT and CCM responded and were able to obtain an airway. The patient then had return to normal rhythm with strong pulses. Total resuscitation time 15-20 mins.   I then helped transport him to CCU.  The patient is critically ill with multiple organ systems failure and requires high complexity decision making for assessment and support, frequent evaluation and titration of therapies, application of advanced monitoring technologies and extensive interpretation of multiple databases.   Critical Care Time devoted to patient care services described in this note is 35 Minutes.   Kaien Pezzullo,MD 2:03 PM

## 2014-07-11 NOTE — Progress Notes (Signed)
53yo male from [facility] to begin tx for CAP.  Will give Levaquin  IV x1 and f/u renal function for further dosing.  Vernard Gambles, PharmD, BCPS 07/11/2014 7:10 AM

## 2014-07-11 NOTE — Progress Notes (Signed)
   K still at 5.9 Creat improved to 4.7 but ur op marginal per RN   Plan Kayexalate 30g x 1  Repeat bmet at 23.30  Dr. Kalman Shan, M.D., [facility].C.P Pulmonary and Critical Care Medicine Staff Physician [place] [facility] [place] [facility] Pager: [phone], If no answer or between  15:00h - 7:00h: call [phone]  07/11/2014 7:03 PM

## 2014-07-11 NOTE — Procedures (Signed)
Central Venous Catheter Insertion Procedure Note JACODY BENEKE [id number] Nov 13, 1960  Procedure: Insertion of Central Venous Catheter Indications: Assessment of intravascular volume and Drug and/or fluid administration  Procedure Details Consent: Unable to obtain consent because of emergent medical necessity. Time Out: Verified patient identification, verified procedure, site/side was marked, verified correct patient position, special equipment/implants available, medications/allergies/relevent history reviewed, required imaging and test results available.  Performed  Maximum sterile technique was used including antiseptics, cap, gloves, gown, hand hygiene, mask and sheet. Skin prep: Chlorhexidine; local anesthetic administered A antimicrobial bonded/coated triple lumen catheter was placed in the left internal jugular vein using the Seldinger technique.  Evaluation Blood flow good Complications: No apparent complications Patient did tolerate procedure well. Chest X-ray ordered to verify placement.  CXR: pending.   Performed under direct MD supervision.  Performed using ultrasound guidance.  Wire visualized in vessel under ultrasound.   Dirk Dress, NP 07/11/2014  10:19 AM  Attending:  I supervised the procedure.  Heber Parmer, MD [place] [facility] Pager: [phone] Cell: [phone] If no response, call [phone]

## 2014-07-11 NOTE — Progress Notes (Signed)
[place] rounds from elink 3pm shift  Hx notee  Camera exam - looks stable   Labs  -  PULMONARY  Recent Labs Lab 07/11/14 1049 07/11/14 1207  PHART 7.278*  --   PCO2ART 46.9*  --   PO2ART 181.0*  --   HCO3 21.9  --   TCO2 23  --   O2SAT 99.0 92.0    CBC  Recent Labs Lab 07/11/14 0640 07/11/14 0915  HGB 13.5 13.8  HCT 42.7 43.7  WBC 13.8* 19.0*  PLT 389 374    COAGULATION  Recent Labs Lab 07/11/14 0640  INR 1.10    CARDIAC   Recent Labs Lab 07/11/14 0640 07/11/14 0915  TROPONINI 0.64* 0.43*   No results found for this basename: PROBNP,  in the last 168 hours   CHEMISTRY  Recent Labs Lab 07/11/14 0640 07/11/14 0915 07/11/14 1200  NA 137 137  --   K 5.9* 4.7 5.9*  CL 96 94*  --   CO2 24 24  --   GLUCOSE 162* 232*  --   BUN 85* 82*  --   CREATININE 5.29* 5.33*  --   CALCIUM 7.7* 8.9  --    Estimated Creatinine Clearance: 16.8 ml/min (by C-G formula based on Cr of 5.33).   LIVER  Recent Labs Lab 07/11/14 0640 07/11/14 0915  AST  --  24  ALT  --  39  ALKPHOS  --  113  BILITOT  --  0.3  PROT  --  6.5  ALBUMIN  --  3.4*  INR 1.10  --      INFECTIOUS  Recent Labs Lab 07/11/14 0915 07/11/14 1200  LATICACIDVEN 3.1*  --   PROCALCITON  --  0.42     ENDOCRINE CBG (last 3)   Recent Labs  07/11/14 0636 07/11/14 1111  GLUCAP 166* 206*         IMAGING x48h Dg Chest Port 1 View  07/11/2014   CLINICAL DATA:  Tube and line placement  EXAM: PORTABLE CHEST - 1 VIEW  COMPARISON:  07/11/2014 at 5:14 a.m.  FINDINGS: Endotracheal tube is appropriately positioned. Left IJ central line tip terminates at the brachiocephalic/ SVC junction. Nasogastric tube tip terminates below the level of the hemidiaphragms but is not included in the field of view. Pacing pads overlie the chest. Median sternotomy wires are reidentified. Mild enlargement of the cardiomediastinal silhouette is noted. There is new right lower lobe confluent  opacification in the setting of underlying interstitial edema and small pleural effusions. No pneumothorax. Cervical fusion hardware partly visualized.  IMPRESSION: Endotracheal tube appropriately positioned.  Left IJ central line tip terminates over the brachiocephalic/SVC junction.  New confluent right lower lobe airspace opacity in the setting of underlying interstitial edema. Differential considerations include asymmetric edema, aspiration, pneumonia, or other alveolar filling processes.   Electronically Signed   By: Christiana Pellant M.D.   On: 07/11/2014 13:21   Dg Chest Port 1 View  07/11/2014   CLINICAL DATA:  Shortness of breath.  EXAM: PORTABLE CHEST - 1 VIEW  COMPARISON:  Chest radiograph performed 07/10/2014  FINDINGS: The lungs are well-aerated. Vascular congestion is noted. Bilateral central airspace opacities may reflect pulmonary edema or possibly multifocal pneumonia. Small bilateral pleural effusions are noted. No pneumothorax is seen.  The cardiomediastinal silhouette is borderline enlarged. The patient is status post median sternotomy. Cervical spinal fusion hardware is noted. No acute osseous abnormalities are seen.  IMPRESSION: Vascular congestion and borderline cardiomegaly. Bilateral central airspace opacities  may reflect pulmonary edema or possibly multifocal pneumonia. Small bilateral pleural effusions noted.   Electronically Signed   By: Roanna Raider M.D.   On: 07/11/2014 05:36   Dg Abd Portable 1v  07/11/2014   CLINICAL DATA:  Orogastric tube placement.  EXAM: PORTABLE ABDOMEN - 1 VIEW  COMPARISON:  May 30, 2014.  FINDINGS: Distal tip of feeding tube is seen in expected position of distal stomach. No abnormal bowel gas pattern is noted.  IMPRESSION: Distal tip of feeding tube is seen in expected position of distal stomach. These results will be called to the ordering clinician or representative by the Radiologist Assistant, and communication documented in the PACS or zVision  Dashboard.   Electronically Signed   By: Roque Lias M.D.   On: 07/11/2014 12:58    A VDRF post arrest K still  High  Plan Repeat bmet    Dr. Kalman Shan, M.D., [facility].C.P Pulmonary and Critical Care Medicine Staff Physician [facility] [place] [facility]monary and Critical Care Pager: [phone], If no answer or between  15:00h - 7:00h: call [phone]  07/11/2014 4:05 PM

## 2014-07-11 NOTE — H&P (Addendum)
[facility] History and Physical  Richard [name] WUJ:[id number] DOB: [birth year] DOA: 07/11/2014  Referring physician: ED physician PCP: No primary provider on file.  Specialists:   Chief Complaint: Generalized weakness, hyperkalemia and altered mental status.  HPI: Richard [name] is a 53 y.o. male  with a past medical history of congestive heart failure, myocardial infarction (S./P. CABG), HLD, GERD, COPD, type 2 diabetes, chronic kidney disease (baseline creatinine 1.5-2.0), who presents with generalized weakness, altered mental status and hyperkalemia.   Patient is transferred from [facility] to [place]. Patient has altered mental status, is difficult to get accurate history directly from the patient. Therefore history was obtained from Dr. Earl Gala in [facility] and patient. Patient reported to Dr. Earl Gala  that he started feeling very weak 2 days ago without much complaints for other symptoms. Patient does not have fever, chills. He has a mild cough and wheezing which might be in his baseline due to his COPD. He does not have chest pain, abdominal pain, diarrhea. Patient went into [facility] emergency room, he was found to have acute renal injury, with creatinine increased from baseline of 1.5-2.0 to 5.0, and hyperkalemia with potassium 6.9, without acute T wave peaking. He has leukocytosis with WBC 14.0.   Patient had elevated blood pressure at 199/114 which decreased to 109/55 after nitroglycerin patch. EKG showed old right bundle blockage Patient was treated with IV calcium chloride, bicarbonate, insulin, and one dose of Kayexalate (30 g by mouth). ABG showed pH 7.15, carbonate drip was started in [facility].  Of note patient was accidentally and wrongly given one dose of Lovenox subcutaneously, 100 mg in [facility].   When patient came to the floor, he is not oriented x3, very drowsy, does not follow commands appropriately. Patient reports that he had 2 falls,  without injury. He seems to move all his extremities. Blood pressure 115/90, heart rate 88, temperature 98.6. He is adimtted to telemetry bed for further evaluation and treatment.  Review of Systems: As presented in the history of presenting illness, rest negative.  Where does patient live?   Can patient participate in ADLs? Yes  Allergy: Allergies not on file  No past medical history on file.  No past surgical history on file.  Social History:  has no tobacco, alcohol, and drug history on file.  Family History:  Family History  Problem Relation Age of Onset  . Hypertension Sister   . Heart attack Father      Prior to Admission medications   Not on File    Physical Exam: Filed Vitals:   07/11/14 0443 07/11/14 0452 07/11/14 0500 07/11/14 0508  BP:      Pulse:      Temp:      TempSrc:      Height:  (1.6 m)     Weight:   102.3 kg (225 lb 8.5 oz)   SpO2:  91%  93%   General: AMS, not follow commands. HEENT:       Eyes: PERRL, EOMI, no scleral icterus       ENT: No discharge from the ears and nose, no pharynx injection, no tonsillar enlargement.        Neck: No JVD, no bruit, no mass felt. Cardiac: S1/S2, RRR, No murmurs, gallops or rubs Pulm: Mild wheezing bilaterally. No rales or rubs. Abd: Soft, nondistended, nontender, no rebound pain, no organomegaly, BS present Ext: trace leg edema bilaterally, 2+DP/PT pulse bilaterally Musculoskeletal: No joint deformities, erythema, or stiffness, ROM  full Skin: No rashes.  Neuro:  not oriented X3, not follow commands. PERRL, Move all extremities. Negative Babinski's sign.  Psych: Patient is not psychotic, no suicidal or hemocidal ideation.  Labs on Admission:  Basic Metabolic Panel: No results found for this basename: NA, K, CL, CO2, GLUCOSE, BUN, CREATININE, CALCIUM, MG, PHOS,  in the last 168 hours Liver Function Tests: No results found for this basename: AST, ALT, ALKPHOS, BILITOT, PROT, ALBUMIN,  in the last 168  hours No results found for this basename: LIPASE, AMYLASE,  in the last 168 hours No results found for this basename: AMMONIA,  in the last 168 hours CBC:  Recent Labs Lab 07/11/14 0640  WBC 13.8*  HGB 13.5  HCT 42.7  MCV 89.5  PLT 389   Cardiac Enzymes: No results found for this basename: CKTOTAL, CKMB, CKMBINDEX, TROPONINI,  in the last 168 hours  BNP (last 3 results) No results found for this basename: PROBNP,  in the last 8760 hours CBG: No results found for this basename: GLUCAP,  in the last 168 hours  Radiological Exams on Admission: Dg Chest Port 1 View  07/11/2014   CLINICAL DATA:  Shortness of breath.  EXAM: PORTABLE CHEST - 1 VIEW  COMPARISON:  Chest radiograph performed 07/10/2014  FINDINGS: The lungs are well-aerated. Vascular congestion is noted. Bilateral central airspace opacities may reflect pulmonary edema or possibly multifocal pneumonia. Small bilateral pleural effusions are noted. No pneumothorax is seen.  The cardiomediastinal silhouette is borderline enlarged. The patient is status post median sternotomy. Cervical spinal fusion hardware is noted. No acute osseous abnormalities are seen.  IMPRESSION: Vascular congestion and borderline cardiomegaly. Bilateral central airspace opacities may reflect pulmonary edema or possibly multifocal pneumonia. Small bilateral pleural effusions noted.   Electronically Signed   By: Roanna Raider M.D.   On: 07/11/2014 05:36    EKG: Independently reviewed. Old right bundle blockage  Assessment/Plan Principal Problem:   Acute on chronic kidney failure Active Problems:   Hyperkalemia   CHF (congestive heart failure)   DM (diabetes mellitus)   CAD (coronary artery disease) of artery bypass graft   HTN (hypertension)   COPD (chronic obstructive pulmonary disease)   GERD (gastroesophageal reflux disease)   Acute encephalopathy   CAP (community acquired pneumonia)  1. acute on chronic kidney injury: Etiology is not clear now.  Hyperkalemia with potassium 6.0 which was treated in [facility] appropriately. On admission, stat EKG without new T wave peaking.   - Admitted to the telemetry bed - Renal consulted it, with followup recommendations. -  repeat BMP stat - continue bicarbonate drip, 50 cc/h - renal US - UA   2. CAP?: Patient has leukocytosis, chest X shows possible infiltration though also possible for pulmonary congestion. His proBMP is significantly evaluated at 12500, but he has acute renal injury, making proBNP unreliable to indicate acute congestive heart failure. Given his hx of CHF and acute renal injury, it is more likely to have pulmonary edema. I will give one dose of lasix and repeat CXR after good diuresis. At the same time, will treat him as CAP.   -Will treat empirically with Levaquin given patient's altered mental status. - blood culture x 2 - Lasix 80 mg x 1 IV. -repeat CXR at 2:00 PM   2. coronary artery disease, s/p CABG: Not sure whether patient has chest pain. EKG showed old right bundle blockage. -will check trop x 3 - will treat if positive trop. -  continue metoprolol and aspirin  3. acute encephalopathy: Likely metabolic encephalopathy. But the patient had fall twice, need to rule out any intracranial injury. - CT-head without CM - neuro check q4h  4. diabetes mellitus-type II: Patient is taking Lantus 65 units twice a day at home -Will decrease her Lantus to 50 units twice a day while on NPO - SSI  5. COPD: He has a mild wheezing bilaterally, does not seem to be acutely exacerbated. -Breathing treatment with albuterol and Atrovent nebulizer Q4 hour - see   6. hypertension: Patient presents to the [facility] emergency room with significantly elevated blood pressure at 199/114. After nitroglycerin patch, his blood pressure decreased to 109/55. On admission his blood pressure is 115/90. -will discontinue lisinopril and hydrochlorothiazide because of acute renal injury, and  also he may have been over-treated with bp dropping more than 25%. - Start amlodipine 10 mg daily  7. CHF: Patient has mild leg edema, his volume status seems to be okay, however chest x-ray showed possible infiltration or pneumonia which is difficult to be differentiated. -will get 2d echo for further evaluation of ventricular function - continue home lasix - see #2  DVT ppx: SCD for today since he accidentally received 1 dose of Lovenox in [facility]. Will start Sq Heparin tomorrow  Code Status: Full code Family Communication: None at bed side. Disposition Plan: Admit to inpatient  Lorretta Harp [facility] Pager [phone]  If 7PM-7AM, please contact night-coverage [email or web address] Password [facility] 07/11/2014, 7:27 AM

## 2014-07-11 NOTE — Progress Notes (Signed)
CARE MANAGEMENT NOTE 07/11/2014  Patient:  ORYAN, WINTERTON   Account Number:  [id number]  Date Initiated:  07/11/2014  Documentation initiated by:  DAVIS,RHONDA  Subjective/Objective Assessment:   pt transferred to [facility] from [facility].  Major cardiac event in ehco lab/Vf/Vt required intubation.     Action/Plan:   to be determined will follow for any cm or dc needs   Anticipated DC Date:  07/14/2014   Anticipated DC Plan:  HOME/SELF CARE  In-house referral  NA      DC Planning Services  NA      [facility] Choice  NA   Choice offered to / List presented to:  NA   DME arranged  NA      DME agency  NA     HH arranged  NA      HH agency  NA   Status of service:  In process, will continue to follow Medicare Important Message given?  NA - LOS <3 / Initial given by admissions (If response is "NO", the following Medicare IM given date fields will be blank) Date Medicare IM given:   Medicare IM given by:   Date Additional Medicare IM given:   Additional Medicare IM given by:    Discharge Disposition:    Per UR Regulation:  Reviewed for med. necessity/level of care/duration of stay  If discussed at Long Length of Stay Meetings, dates discussed:    Comments:  Bjorn Loser Davis,RN,BSN,CCM

## 2014-07-11 NOTE — Procedures (Signed)
Arterial Catheter Insertion Procedure Note Richard [name] [id number] 09-09-1961  Procedure: Insertion of Arterial Catheter  Indications: Blood pressure monitoring and Frequent blood sampling  Procedure Details Consent: Unable to obtain consent because of emergent medical necessity. Time Out: Verified patient identification, verified procedure, site/side was marked, verified correct patient position, special equipment/implants available, medications/allergies/relevent history reviewed, required imaging and test results available.  Performed  Maximum sterile technique was used including antiseptics, cap, gloves, gown, hand hygiene, mask and sheet. Skin prep: Chlorhexidine; local anesthetic administered 20 gauge catheter was inserted into right radial artery using the Seldinger technique.  Evaluation Blood flow good; BP tracing good. Complications: No apparent complications. RT placed arrow catheter on first attempt.   Morley Kos 07/11/2014

## 2014-07-11 NOTE — Consult Note (Signed)
PULMONARY / CRITICAL CARE MEDICINE   Name: Richard [name] MRN: [id number] DOB: [birth year]    ADMISSION DATE:  07/11/2014 CONSULTATION DATE: 9/14  REFERRING MD :  Benjamine Mola  CHIEF COMPLAINT:  Cardiac arrest.   INITIAL PRESENTATION:  53 year old male transferred [facility] [place] on 9/14 w/ working dx of acute on chronic renal failure, hyperkalemia, encephalopathy and possible CAP. He was in ECHO when suddenly developed VF/VT arrest. ROSC estimated at 10-15 minutes. PCCM asked to assist w/ care s/p arrest.   STUDIES:  9/14 ECHO>>> 9/14 renal US>>>  SIGNIFICANT EVENTS: 9/14: admitted w/ acute on chronic renal failure, hyperkalemia and encephalopathy  9/14: cardiac arrest (VF/VT) while in echo   HISTORY OF PRESENT ILLNESS:    53 y.o. 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 CABG), HLD, GERD, COPD, type 2 diabetes, chronic kidney disease (baseline creatinine 1.5-2.0), who presented 9/14 with generalized weakness, altered mental status and hyperkalemia. Patient was transferred from [facility] to [place]. Patient has altered mental status. Per records pt started feeling very weak 2 days ago without much complaints for other symptoms. Patient did not have fever, chills. He has a mild cough and wheezing which might be in his baseline due to his COPD. He did not have chest pain, abdominal pain, diarrhea. Patient went into [facility] emergency room, he was found to have acute renal injury, with creatinine increased from baseline of 1.5-2.0 to 5.0, and hyperkalemia with potassium 6.9, without acute T wave peaking. His blood pressure was initially 199/114 which decreased to 109/55 after nitroglycerin patch. EKG showed old right bundle blockage Patient was treated with IV calcium chloride, bicarbonate, insulin, and one dose of Kayexalate (30 g by mouth). ABG showed pH 7.15, bicarbonate drip was started in [facility]. Of note patient was  accidentally and wrongly given one dose of Lovenox subcutaneously, 100 mg in [facility]. When patient came to the floor, he is not oriented x3, very drowsy, does not follow commands appropriately. Patient reports that he had 2 falls, without injury. He seems to move all his extremities. Blood pressure 115/90, heart rate 88, temperature 98.6. He is adimtted to telemetry bed for further evaluation and treatment. He was admitted to hospital ist service w/ working dx of acute on chronic renal failure, Hyperkalemia, acute encephalopathy,  Presumed heart failure and possible CAP. Was in ECHO lab the am of 9/14 when suddenly became unresponsive and suffered a VF/VT arrest. ACLS was initiated defibrillated. ROSC estimated 10-15 minutes. Transferred to the CICU s/p arrest.    PAST MEDICAL HISTORY :  congestive heart failure, myocardial infarction (S./P. CABG), HLD, GERD, COPD, type 2 diabetes, chronic kidney disease (baseline creatinine 1.5-2.0),  Prior to Admission medications   Not on File   Allergies not on file  FAMILY HISTORY:  Family History  Problem Relation Age of Onset  . Hypertension Sister   . Heart attack Father    SOCIAL HISTORY:  has no tobacco, alcohol, and drug history on file.  REVIEW OF SYSTEMS:  Unable   SUBJECTIVE:  Sedated on vent  VITAL SIGNS: Temp:  [98.6 F (37 C)] 98.6 F (37 C) (09/14 0350) Pulse Rate:  [88] 88 (09/14 0350) BP: (115)/(90) 115/90 mmHg (09/14 0350) SpO2:  [88 %-93 %] 93 % (09/14 0508) Weight:  [102.3 kg (225 lb 8.5 oz)] 102.3 kg (225 lb 8.5 oz) (09/14 0500) HEMODYNAMICS:   VENTILATOR SETTINGS:   INTAKE / OUTPUT:  Intake/Output Summary (Last 24  hours) at 07/11/14 1610 Last data filed at 07/11/14 9604  Gross per 24 hour  Intake      0 ml  Output    181 ml  Net   -181 ml    PHYSICAL EXAMINATION: General:  Obese 53 year old male, sedated on vent s/p arrest  Neuro:  Sedated, agitated at times  HEENT:  Orally intubated. Residual bloody  oral secretions. Neck large no clear JVD Cardiovascular:  Distant rrr  Lungs:  Exp wheeze, equal no accessory muscle use on vent  Abdomen:  Soft, non-tender. + bowel sounds obese Musculoskeletal:  Intact  Skin:  LE edema   LABS:  CBC  Recent Labs Lab 07/11/14 0640  WBC 13.8*  HGB 13.5  HCT 42.7  PLT 389   Coag's  Recent Labs Lab 07/11/14 0640  APTT 38*  INR 1.10   BMET  Recent Labs Lab 07/11/14 0640  NA 137  K 5.9*  CL 96  CO2 24  BUN 85*  CREATININE 5.29*  GLUCOSE 162*   Electrolytes  Recent Labs Lab 07/11/14 0640  CALCIUM 7.7*   Sepsis Markers No results found for this basename: LATICACIDVEN, PROCALCITON, O2SATVEN,  in the last 168 hours ABG No results found for this basename: PHART, PCO2ART, PO2ART,  in the last 168 hours Liver Enzymes No results found for this basename: AST, ALT, ALKPHOS, BILITOT, ALBUMIN,  in the last 168 hours Cardiac Enzymes  Recent Labs Lab 07/11/14 0640  TROPONINI 0.64*   Glucose No results found for this basename: GLUCAP,  in the last 168 hours  Imaging No results found.   ASSESSMENT / PLAN:  PULMONARY OETT 9/14>>> A: acute respiratory failure s/p cardiopulm arrest.      Pulmonary infiltrates R>L  > favor pulm edema but with leukocytosis  P:   Full vent support F/u PCXR and ABG Hypothermia protocol   CARDIOVASCULAR CVL A:  VF/VT arrest. 10-15 minutes to ROSC  Circulatory shock: prelim echo OK... ? Sepsis vs cardiogenic  Pulmonary edema  P:  Fluid challenge Vasopressors for MAP goal >65 D/c levaquin given concern for qtc changes Dc antihypertensives Check procalcitonin, SvO2  RENAL A:   Acute on chronic renal failure (baseline scr 1.5-2.0) Hyperkalemia > improving  P:   Stat repeat chemistry Cont bicarb gtt Renal consult. May need emergent HD   GASTROINTESTINAL A:   Obesity  P:   NPO  PPI   HEMATOLOGIC A:   No acute issues   P:  Trend CBC and coags  [place] heparin    INFECTIOUS A:  Severe CAP? P:   BCx2 9/14>>> Sputum 9/14>>> Abx: levaquin, start date 9/14>>>9/14 ABX: doxy 9/14>>> ABX rocephin 9/14>>>  ENDOCRINE A:   DM P:   ssi protocol   NEUROLOGIC A:   Acute encephalopathy s/p cardiac arrest > improving P:   RASS goal: -4 Supportive care  Hold arctic sun protocol given improving mental status  TODAY'S SUMMARY:  S/p cardiac arrest. ROSC 10-15 minutes. Uncertain etiology> acidosis? Improving mental status so hold hypthermia. Cont empiric CAP coverage but change levaquin to doxy. Will get stat chemistry. Renal consult pending.    Attending:  I have seen and examined the patient with nurse practitioner/resident and agree with the note above.   I have personally obtained a history, examined the patient, evaluated laboratory and imaging results, formulated the assessment and plan and placed orders. CRITICAL CARE: The patient is critically ill with multiple organ systems failure and requires high complexity decision making for assessment  and support, frequent evaluation and titration of therapies, application of advanced monitoring technologies and extensive interpretation of multiple databases. Critical Care Time devoted to patient care services described in this note is 60 minutes.   Heber Winfield, MD [facility] Pager: [phone] Cell: [phone] If no response, call [phone]   07/11/2014, 9:28 AM

## 2014-07-11 NOTE — Procedures (Signed)
Intubation Procedure Note Richard [name] [id number] August 18, 1961  Procedure: Intubation Indications: Respiratory insufficiency  Procedure Details Consent: Risks of procedure as well as the alternatives and risks of each were explained to the (patient/caregiver).  Consent for procedure obtained. Time Out: Verified patient identification, verified procedure, site/side was marked, verified correct patient position, special equipment/implants available, medications/allergies/relevent history reviewed, required imaging and test results available.  Performed  Drugs none DL x 1 with GS4 blade Grade 2 view 7.5 ET tube passed through cords under direct visualization Placement confirmed with bilateral breath sounds, positive EtCO2 change and smoke in tube   Evaluation Hemodynamic Status: BP stable throughout; O2 sats: not monitored during procedure (coded) Patient's Current Condition: stable Complications: No apparent complications Patient did tolerate procedure well. Chest X-ray ordered to verify placement.  CXR: pending.   Max Fickle 07/11/2014

## 2014-07-11 NOTE — Progress Notes (Signed)
Patient received via care link alert and oriented. Skin warm and dry face bluish red 02 sat on 2l./m n.c.88-91% S.R. On monitor rapid resp. Called to assess patient M.D. Page and made aware of all v.s. And came to see and assess patient. Was told in report from R.N. At [facility] patient received Lovenox 100 mg s.q.  In E.R. At 1:10 a.m. M.D. Aware of that and  bladder scan and no output since adm . To E..R. At [place] per R.N. Report.

## 2014-07-11 NOTE — Consult Note (Signed)
CONSULTATION NOTE  Reason for Consult: Cardiac arrest  Requesting Physician: Dr. Lake Bells  Cardiologist: None (New)  HPI: This is a 53 y.o. male with a past medical history significant for congestive heart failure (unspecified), MI in the past s/p CABG, HPL, HTN, COPD, GERD and CKD 2.  He presented to [facility] with generalized weakness and altered mental status as well as hyperkalemia and was transferred to [facility]. Potassium was up to 6.9, he also has an elevated WBC count of 14. ABG on admission shows a metabolic acidosis and labs indicated acute on chronic kidney injury.  Mr. Uzzle was noted to be somnolent this morning and was taken down to the [facility] for echo - it seems that after the procedure he became unresponsive and code blue was called. He received CPR - presenting rhythm was either VF or VT (both noted) - 2 shocks were given and he received 3 doses of epinephrine, bicarbonate, calcium and amiodarone. ROSC was noted in 16 minutes. He was transferred to the CCU for further monitoring.  PMHx:  Past Medical History  Diagnosis Date  . CAD (coronary artery disease) of artery bypass graft 07/11/2014  . Cardiac arrest 07/11/2014  . CHF (congestive heart failure) 07/11/2014  . HTN (hypertension) 07/11/2014  . COPD (chronic obstructive pulmonary disease) 07/11/2014  . DM (diabetes mellitus) 07/11/2014  . GERD (gastroesophageal reflux disease) 07/11/2014  . Acute on chronic kidney failure 07/11/2014   No past surgical history on file.  FAMHx: Family History  Problem Relation Age of Onset  . Hypertension Sister   . Heart attack Father     SOCHx:  has no tobacco, alcohol, and drug history on file.  ALLERGIES: No Known Allergies  ROS: Review of systems not obtained due to patient factors.  HOME MEDICATIONS: No prescriptions prior to admission   HOSPITAL MEDICATIONS: Scheduled: . sodium chloride  2,000 mL Intravenous Once  . albuterol  2.5 mg Nebulization Q6H    . antiseptic oral rinse  7 mL Mouth Rinse QID  . artificial tears  1 application Both Eyes 3 times per day  . aspirin  300 mg Rectal NOW  . atorvastatin  20 mg Per Tube q1800  . cefTRIAXone (ROCEPHIN)  IV  1 g Intravenous Q24H  . chlorhexidine  15 mL Mouth Rinse BID  . cisatracurium  0.1 mg/kg Intravenous Once  . DOPamine      . doxycycline (VIBRAMYCIN) IV  100 mg Intravenous Q12H  . heparin subcutaneous  5,000 Units Subcutaneous 3 times per day  . insulin aspart  0-15 Units Subcutaneous 6 times per day  . midazolam  2 mg Intravenous Once  . pantoprazole (PROTONIX) IV  40 mg Intravenous Daily    VITALS: Blood pressure 62/23, pulse 68, temperature 98.6 F (37 C), temperature source Oral, resp. rate 28, height $RemoveBe'5\' 3"'vHBShtdXf$  (1.6 m), weight 225 lb 8.5 oz (102.3 kg), SpO2 100.00%.  PHYSICAL EXAM: General appearance: Intubated, became alert during exam and able to follow commands Neck: no carotid bruit and thick neck, JVP does not appear elevated Lungs: diminished breath sounds bilaterally Heart: regular tachycardia Abdomen: obese, protuberant, non-tender, high pitched bowel sounds Extremities: edema trace bilateral LE edema Pulses: 2+ and symmetric Skin: pale, cool dry Neurologic: Mental status: Intubated, follows simple commands .  LABS: Results for orders placed during the hospital encounter of 07/11/14 (from the past 48 hour(s))  URINALYSIS, ROUTINE W REFLEX MICROSCOPIC     Status: Abnormal   Collection Time  07/11/14  4:58 AM      Result Value Ref Range   Color, Urine YELLOW  YELLOW   APPearance CLOUDY (*) CLEAR   Specific Gravity, Urine 1.021  1.005 - 1.030   pH 5.0  5.0 - 8.0   Glucose, UA NEGATIVE  NEGATIVE mg/dL   Hgb urine dipstick TRACE (*) NEGATIVE   Bilirubin Urine NEGATIVE  NEGATIVE   Ketones, ur NEGATIVE  NEGATIVE mg/dL   Protein, ur 30 (*) NEGATIVE mg/dL   Urobilinogen, UA 0.2  0.0 - 1.0 mg/dL   Nitrite NEGATIVE  NEGATIVE   Leukocytes, UA SMALL (*) NEGATIVE   URINE MICROSCOPIC-ADD ON     Status: Abnormal   Collection Time    07/11/14  4:58 AM      Result Value Ref Range   Squamous Epithelial / LPF FEW (*) RARE   WBC, UA 3-6  <3 WBC/hpf   RBC / HPF 3-6  <3 RBC/hpf   Bacteria, UA FEW (*) RARE   Casts HYALINE CASTS (*) NEGATIVE  GLUCOSE, CAPILLARY     Status: Abnormal   Collection Time    07/11/14  6:36 AM      Result Value Ref Range   Glucose-Capillary 166 (*) 70 - 99 mg/dL  TROPONIN I     Status: Abnormal   Collection Time    07/11/14  6:40 AM      Result Value Ref Range   Troponin I 0.64 (*) <0.30 ng/mL   Comment:            Due to the release kinetics of cTnI,     a negative result within the first hours     of the onset of symptoms does not rule out     myocardial infarction with certainty.     If myocardial infarction is still suspected,     repeat the test at appropriate intervals.     CRITICAL RESULT CALLED TO, READ BACK BY AND VERIFIED WITH:     LONG A RN 07/11/14 0752 COSTELLO B  BASIC METABOLIC PANEL     Status: Abnormal   Collection Time    07/11/14  6:40 AM      Result Value Ref Range   Sodium 137  137 - 147 mEq/L   Potassium 5.9 (*) 3.7 - 5.3 mEq/L   Chloride 96  96 - 112 mEq/L   CO2 24  19 - 32 mEq/L   Glucose, Bld 162 (*) 70 - 99 mg/dL   BUN 85 (*) 6 - 23 mg/dL   Creatinine, Ser 5.29 (*) 0.50 - 1.35 mg/dL   Calcium 7.7 (*) 8.4 - 10.5 mg/dL   GFR calc non Af Amer 11 (*) >90 mL/min   GFR calc Af Amer 13 (*) >90 mL/min   Comment: (NOTE)     The eGFR has been calculated using the CKD EPI equation.     This calculation has not been validated in all clinical situations.     eGFR's persistently <90 mL/min signify possible Chronic Kidney     Disease.   Anion gap 17 (*) 5 - 15  CBC     Status: Abnormal   Collection Time    07/11/14  6:40 AM      Result Value Ref Range   WBC 13.8 (*) 4.0 - 10.5 K/uL   RBC 4.77  4.22 - 5.81 MIL/uL   Hemoglobin 13.5  13.0 - 17.0 g/dL   HCT 42.7  39.0 - 52.0 %   MCV  89.5  78.0 -  100.0 fL   MCH 28.3  26.0 - 34.0 pg   MCHC 31.6  30.0 - 36.0 g/dL   RDW 17.5 (*) 11.5 - 15.5 %   Platelets 389  150 - 400 K/uL  PROTIME-INR     Status: None   Collection Time    07/11/14  6:40 AM      Result Value Ref Range   Prothrombin Time 14.2  11.6 - 15.2 seconds   INR 1.10  0.00 - 1.49  APTT     Status: Abnormal   Collection Time    07/11/14  6:40 AM      Result Value Ref Range   aPTT 38 (*) 24 - 37 seconds   Comment:            IF BASELINE aPTT IS ELEVATED,     SUGGEST PATIENT RISK ASSESSMENT     BE USED TO DETERMINE APPROPRIATE     ANTICOAGULANT THERAPY.  TROPONIN I     Status: Abnormal   Collection Time    07/11/14  9:15 AM      Result Value Ref Range   Troponin I 0.43 (*) <0.30 ng/mL   Comment:            Due to the release kinetics of cTnI,     a negative result within the first hours     of the onset of symptoms does not rule out     myocardial infarction with certainty.     If myocardial infarction is still suspected,     repeat the test at appropriate intervals.     CRITICAL VALUE NOTED.  VALUE IS CONSISTENT WITH PREVIOUSLY REPORTED AND CALLED VALUE.  CBC     Status: Abnormal   Collection Time    07/11/14  9:15 AM      Result Value Ref Range   WBC 19.0 (*) 4.0 - 10.5 K/uL   RBC 4.81  4.22 - 5.81 MIL/uL   Hemoglobin 13.8  13.0 - 17.0 g/dL   HCT 43.7  39.0 - 52.0 %   MCV 90.9  78.0 - 100.0 fL   MCH 28.7  26.0 - 34.0 pg   MCHC 31.6  30.0 - 36.0 g/dL   RDW 17.7 (*) 11.5 - 15.5 %   Platelets 374  150 - 400 K/uL  LACTIC ACID, PLASMA     Status: Abnormal   Collection Time    07/11/14  9:15 AM      Result Value Ref Range   Lactic Acid, Venous 3.1 (*) 0.5 - 2.2 mmol/L  AMMONIA     Status: None   Collection Time    07/11/14  9:15 AM      Result Value Ref Range   Ammonia 39  11 - 60 umol/L  COMPREHENSIVE METABOLIC PANEL     Status: Abnormal   Collection Time    07/11/14  9:15 AM      Result Value Ref Range   Sodium 137  137 - 147 mEq/L   Potassium 4.7   3.7 - 5.3 mEq/L   Comment: HEMOLYSIS AT THIS LEVEL MAY AFFECT RESULT   Chloride 94 (*) 96 - 112 mEq/L   CO2 24  19 - 32 mEq/L   Glucose, Bld 232 (*) 70 - 99 mg/dL   BUN 82 (*) 6 - 23 mg/dL   Creatinine, Ser 5.33 (*) 0.50 - 1.35 mg/dL   Calcium 8.9  8.4 - 10.5 mg/dL   Total  Protein 6.5  6.0 - 8.3 g/dL   Albumin 3.4 (*) 3.5 - 5.2 g/dL   AST 24  0 - 37 U/L   Comment: HEMOLYSIS AT THIS LEVEL MAY AFFECT RESULT   ALT 39  0 - 53 U/L   Comment: HEMOLYSIS AT THIS LEVEL MAY AFFECT RESULT   Alkaline Phosphatase 113  39 - 117 U/L   Total Bilirubin 0.3  0.3 - 1.2 mg/dL   GFR calc non Af Amer 11 (*) >90 mL/min   GFR calc Af Amer 13 (*) >90 mL/min   Comment: (NOTE)     The eGFR has been calculated using the CKD EPI equation.     This calculation has not been validated in all clinical situations.     eGFR's persistently <90 mL/min signify possible Chronic Kidney     Disease.   Anion gap 19 (*) 5 - 15    IMAGING: Dg Chest Port 1 View  07/11/2014   CLINICAL DATA:  Shortness of breath.  EXAM: PORTABLE CHEST - 1 VIEW  COMPARISON:  Chest radiograph performed 07/10/2014  FINDINGS: The lungs are well-aerated. Vascular congestion is noted. Bilateral central airspace opacities may reflect pulmonary edema or possibly multifocal pneumonia. Small bilateral pleural effusions are noted. No pneumothorax is seen.  The cardiomediastinal silhouette is borderline enlarged. The patient is status post median sternotomy. Cervical spinal fusion hardware is noted. No acute osseous abnormalities are seen.  IMPRESSION: Vascular congestion and borderline cardiomegaly. Bilateral central airspace opacities may reflect pulmonary edema or possibly multifocal pneumonia. Small bilateral pleural effusions noted.   Electronically Signed   By: Garald Balding M.D.   On: 07/11/2014 05:36    HOSPITAL DIAGNOSES: Principal Problem:   Cardiac arrest Active Problems:   Acute on chronic kidney failure   Hyperkalemia   CHF (congestive  heart failure)   DM (diabetes mellitus)   CAD (coronary artery disease) of artery bypass graft   HTN (hypertension)   COPD (chronic obstructive pulmonary disease)   GERD (gastroesophageal reflux disease)   Acute encephalopathy   CAP (community acquired pneumonia)   IMPRESSION: 1. Cardiac arrest -with initial VF, suspect related to hyperkalemia 2. CAD s/p CABG (details unknown) 3.   Hyperkalemia with acute renal failure 4.   Acute renal failure on chronic CKD 2.  RECOMMENDATION: 1. Mr. Mccallum is now waking up after his cardiac arrest. He was being prepped for the [employer] protocol. I discussed management with Dr. Lake Bells and we will hold off on that now that he is following commands. He is tachycardic and BP has come up to a MAP of 70 on dopamine. Would recommend weaning that down to keep MAP>60. I will review his pre-arrest echo as well, but it was reported that Dr. Haroldine Laws reviewed it and there were no significant baseline wall motion abnormalities. At this point there is no dynamic ST elevation, therefore, I would not recommend cardiac catheterization, especially in light of his renal failure. Would try to work toward extubation if his mental status continues to improve. Agree with trending cardiac enzymes. Will need aggressive electrolyte management.  We will follow closely with you.  Time Spent Directly with Patient: 45 minutes of critical care time  CRITICAL CARE:  The patient is critically ill with multi-organ system failure and requires high complexity decision making for assessment and support, frequent evaluation and titration of therapies, application of advanced monitoring technologies and extensive interpretation of multiple databases.  Pixie Casino, MD, [facility] Attending Cardiologist [facility]  Saburo Luger  C 07/11/2014, 10:33 AM

## 2014-07-11 NOTE — Progress Notes (Signed)
  Echocardiogram 2D Echocardiogram limited has been performed. The patient coded while echo was in progress.   Cathie Beams 07/11/2014, 11:38 AM

## 2014-07-11 NOTE — Progress Notes (Signed)
MD notified of pt's potassium level and CVP. Orders received; will continue to monitor closely and update as needed.

## 2014-07-11 NOTE — Progress Notes (Signed)
CODE BLUE NOTE  Patient Name: Richard [name]   MRN: [id number]   Date of Birth/ Sex: [birth year] , male      Admission Date: 07/11/2014  Attending Provider: Lupita Leash, MD  Primary Diagnosis: Cardiac arrest    Indication: Pt was in his usual state of health until this AM, when he was noted to be unresponsive. Code blue was subsequently called. At the time of arrival on scene, ACLS protocol was underway.    Technical Description:  - CPR performance duration:  15  minutes  - Was defibrillation or cardioversion used? Yes   - Was external pacer placed? No  - Was patient intubated pre/post CPR? Yes    Medications Administered: Y = Yes; Blank = No Amiodarone  Y  Atropine    Calcium  Y  Epinephrine  Y  Lidocaine    Magnesium    Norepinephrine    Phenylephrine    Sodium bicarbonate  Y  Vasopressin      Post CPR evaluation:  - Final Status - Was patient successfully resuscitated ? Yes - What is current rhythm? Sinus - What is current hemodynamic status? Pulse returned, normotensive.   Miscellaneous Information:  - Labs sent, including: Istat chem8,  Troponin, ABG, STAT CBC  - Primary team notified?  Yes  - Family Notified? No  - Additional notes/ transfer status: Transferring to 2H.        Luisa Dago, MD  07/11/2014, 1:01 PM

## 2014-07-11 NOTE — Progress Notes (Signed)
Speech Language Pathology Discharge Patient Details Name: Richard [name]: [id number] DOB: 11-Jun-1961 Today's Date: 07/11/2014 Time:  -     Patient discharged from SLP services secondary to pt. coded in vascular lab, now intubated.  Please see latest therapy progress note for current level of functioning and progress toward goals.    Progress and discharge plan discussed with patient and/or caregiver: Patient unable to participate in discharge planning and no caregivers available  GO     Royce Macadamia 07/11/2014, 10:05 AM   Breck Coons Lonell Face.Ed [employer] [phone]

## 2014-07-12 ENCOUNTER — Inpatient Hospital Stay (HOSPITAL_COMMUNITY): Payer: Medicaid Other

## 2014-07-12 DIAGNOSIS — I509 Heart failure, unspecified: Secondary | ICD-10-CM

## 2014-07-12 DIAGNOSIS — I369 Nonrheumatic tricuspid valve disorder, unspecified: Secondary | ICD-10-CM

## 2014-07-12 DIAGNOSIS — I5021 Acute systolic (congestive) heart failure: Secondary | ICD-10-CM

## 2014-07-12 LAB — BLOOD GAS, ARTERIAL
Acid-base deficit: 2.7 mmol/L — ABNORMAL HIGH (ref 0.0–2.0)
Bicarbonate: 23.3 mEq/L (ref 20.0–24.0)
Drawn by: 41934
FIO2: 0.65 %
O2 Saturation: 93.6 %
PATIENT TEMPERATURE: 98.6
PEEP/CPAP: 5 cmH2O
RATE: 20 resp/min
TCO2: 24.9 mmol/L (ref 0–100)
VT: 460 mL
pCO2 arterial: 53.1 mmHg — ABNORMAL HIGH (ref 35.0–45.0)
pH, Arterial: 7.264 — ABNORMAL LOW (ref 7.350–7.450)
pO2, Arterial: 77 mmHg — ABNORMAL LOW (ref 80.0–100.0)

## 2014-07-12 LAB — GLUCOSE, CAPILLARY
GLUCOSE-CAPILLARY: 164 mg/dL — AB (ref 70–99)
GLUCOSE-CAPILLARY: 268 mg/dL — AB (ref 70–99)
Glucose-Capillary: 162 mg/dL — ABNORMAL HIGH (ref 70–99)
Glucose-Capillary: 165 mg/dL — ABNORMAL HIGH (ref 70–99)
Glucose-Capillary: 172 mg/dL — ABNORMAL HIGH (ref 70–99)
Glucose-Capillary: 196 mg/dL — ABNORMAL HIGH (ref 70–99)
Glucose-Capillary: 233 mg/dL — ABNORMAL HIGH (ref 70–99)
Glucose-Capillary: 247 mg/dL — ABNORMAL HIGH (ref 70–99)

## 2014-07-12 LAB — CBC
HEMATOCRIT: 38 % — AB (ref 39.0–52.0)
Hemoglobin: 12.2 g/dL — ABNORMAL LOW (ref 13.0–17.0)
MCH: 27.9 pg (ref 26.0–34.0)
MCHC: 32.1 g/dL (ref 30.0–36.0)
MCV: 87 fL (ref 78.0–100.0)
Platelets: 307 10*3/uL (ref 150–400)
RBC: 4.37 MIL/uL (ref 4.22–5.81)
RDW: 17.4 % — ABNORMAL HIGH (ref 11.5–15.5)
WBC: 14 10*3/uL — AB (ref 4.0–10.5)

## 2014-07-12 LAB — COMPREHENSIVE METABOLIC PANEL
ALBUMIN: 3 g/dL — AB (ref 3.5–5.2)
ALK PHOS: 89 U/L (ref 39–117)
ALT: 34 U/L (ref 0–53)
AST: 20 U/L (ref 0–37)
Anion gap: 18 — ABNORMAL HIGH (ref 5–15)
BUN: 83 mg/dL — ABNORMAL HIGH (ref 6–23)
CO2: 23 mEq/L (ref 19–32)
Calcium: 7.4 mg/dL — ABNORMAL LOW (ref 8.4–10.5)
Chloride: 98 mEq/L (ref 96–112)
Creatinine, Ser: 4.45 mg/dL — ABNORMAL HIGH (ref 0.50–1.35)
GFR calc Af Amer: 16 mL/min — ABNORMAL LOW (ref >90)
GFR calc non Af Amer: 14 mL/min — ABNORMAL LOW (ref >90)
Glucose, Bld: 162 mg/dL — ABNORMAL HIGH (ref 70–99)
POTASSIUM: 4.9 meq/L (ref 3.7–5.3)
SODIUM: 139 meq/L (ref 137–147)
TOTAL PROTEIN: 5.9 g/dL — AB (ref 6.0–8.3)
Total Bilirubin: 0.3 mg/dL (ref 0.3–1.2)

## 2014-07-12 LAB — PROCALCITONIN: PROCALCITONIN: 2.53 ng/mL

## 2014-07-12 LAB — TROPONIN I: Troponin I: 1.14 ng/mL (ref <0.30)

## 2014-07-12 MED ORDER — PERFLUTREN LIPID MICROSPHERE
INTRAVENOUS | Status: AC
  Filled 2014-07-12: qty 10

## 2014-07-12 MED ORDER — DEXTROSE 5 % IV SOLN
0.5000 ug/min | INTRAVENOUS | Status: DC
  Administered 2014-07-12: 5 ug/min via INTRAVENOUS
  Administered 2014-07-13: 15 ug/min via INTRAVENOUS
  Filled 2014-07-12 (×2): qty 16

## 2014-07-12 MED ORDER — VITAL HIGH PROTEIN PO LIQD
1000.0000 mL | ORAL | Status: DC
  Administered 2014-07-12 – 2014-07-14 (×3): 1000 mL
  Filled 2014-07-12 (×7): qty 1000

## 2014-07-12 MED ORDER — FUROSEMIDE 10 MG/ML IJ SOLN
40.0000 mg | Freq: Three times a day (TID) | INTRAMUSCULAR | Status: AC
  Administered 2014-07-12 (×2): 40 mg via INTRAVENOUS
  Filled 2014-07-12 (×2): qty 4

## 2014-07-12 MED ORDER — PERFLUTREN LIPID MICROSPHERE
1.0000 mL | INTRAVENOUS | Status: AC | PRN
  Administered 2014-07-12: 2 mL via INTRAVENOUS
  Filled 2014-07-12: qty 10

## 2014-07-12 MED ORDER — PRO-STAT SUGAR FREE PO LIQD
60.0000 mL | Freq: Two times a day (BID) | ORAL | Status: DC
  Administered 2014-07-12 – 2014-07-14 (×5): 60 mL
  Filled 2014-07-12 (×7): qty 60

## 2014-07-12 NOTE — Progress Notes (Signed)
PULMONARY / CRITICAL CARE MEDICINE   Name: MISAEL MCGAHA MRN: [id number] DOB: Oct 22, 1961    ADMISSION DATE:  07/11/2014 CONSULTATION DATE: 9/14  REFERRING MD :  Benjamine Mola  CHIEF COMPLAINT:  Cardiac arrest.   INITIAL PRESENTATION:  53 year old male transferred to [facility] from [place] on 9/14 w/ working dx of acute on chronic renal failure, hyperkalemia, encephalopathy and possible CAP. He was in ECHO when suddenly developed VF/VT arrest. ROSC estimated at 10-15 minutes. PCCM asked to assist w/ care s/p arrest.   STUDIES:  9/14 ECHO>>> 9/14 renal US>>>  SIGNIFICANT EVENTS: 9/14: admitted w/ acute on chronic renal failure, hyperkalemia and encephalopathy  9/14: cardiac arrest (VF/VT) while in echo  SUBJECTIVE:  Sedated on vent   VITAL SIGNS: Temp:  [97.5 F (36.4 C)-100.2 F (37.9 C)] 100.2 F (37.9 C) (09/15 0720) Pulse Rate:  [68-121] 87 (09/15 0800) Resp:  [9-28] 24 (09/15 0800) BP: (62-139)/(14-68) 100/39 mmHg (09/14 2000) SpO2:  [94 %-100 %] 96 % (09/15 0828) Arterial Line BP: (76-181)/(34-78) 156/55 mmHg (09/15 0800) FiO2 (%):  [60 %-100 %] 60 % (09/15 0828) Weight:  [102.8 kg (226 lb 10.1 oz)] 102.8 kg (226 lb 10.1 oz) (09/15 0237)  HEMODYNAMICS: CVP:  [14 mmHg-15 mmHg] 15 mmHg  VENTILATOR SETTINGS: Vent Mode:  [-] PRVC FiO2 (%):  [60 %-100 %] 60 % Set Rate:  [20 bmp-28 bmp] 20 bmp Vt Set:  [460 mL] 460 mL PEEP:  [5 cmH20] 5 cmH20 Plateau Pressure:  [20 cmH20-36 cmH20] 24 cmH20  INTAKE / OUTPUT:  Intake/Output Summary (Last 24 hours) at 07/12/14 4098 Last data filed at 07/12/14 0800  Gross per 24 hour  Intake 1267.67 ml  Output    750 ml  Net 517.67 ml   PHYSICAL EXAMINATION: General: Obese 53 year old male, sedated on vent s/p arrest  Neuro: Sedated, agitated at times  HEENT: Orally intubated. Residual bloody oral secretions. Neck large no clear JVD Cardiovascular: Distant rrr  Lungs: Exp wheeze, equal no accessory muscle use on vent  Abdomen: Soft,  non-tender. + bowel sounds obese Musculoskeletal:  Intact  Skin:  LE edema   LABS:  CBC  Recent Labs Lab 07/11/14 0640 07/11/14 0915 07/12/14 0400  WBC 13.8* 19.0* 14.0*  HGB 13.5 13.8 12.2*  HCT 42.7 43.7 38.0*  PLT 389 374 307     Coag's  Recent Labs Lab 07/11/14 0640  APTT 38*  INR 1.10   BMET  Recent Labs Lab 07/11/14 1635 07/11/14 2240 07/12/14 0400  NA 136* 139 139  K 5.9* 5.2 4.9  CL 95* 98 98  CO2 BUN 83* 83* 83*  CREATININE 4.74* 4.87* 4.45*  GLUCOSE 221* 192* 162*   Electrolytes  Recent Labs Lab 07/11/14 1635 07/11/14 2240 07/12/14 0400  CALCIUM 7.7* 7.7* 7.4*   Sepsis Markers  Recent Labs Lab 07/11/14 0915 07/11/14 1200 07/12/14 0400  LATICACIDVEN 3.1*  --   --   PROCALCITON  --  0.42 2.53   ABG   Recent Labs Lab 07/11/14 1049 07/12/14 0319  PHART 7.278* 7.264*  PCO2ART 46.9* 53.1*  PO2ART 181.0* 77.0*   Liver Enzymes  Recent Labs Lab 07/11/14 0915 07/12/14 0400  AST 24 20  ALT 39 34  ALKPHOS 113 89  BILITOT 0.3 0.3  ALBUMIN 3.4* 3.0*   Cardiac Enzymes  Recent Labs Lab 07/11/14 0915 07/11/14 2240 07/12/14 0400  TROPONINI 0.43* 1.28* 1.14*   Glucose  Recent Labs Lab 07/11/14 1111 07/11/14 1636 07/11/14  1932 07/12/14 0004 07/12/14 0415 07/12/14 0745  GLUCAP 206* 212* 173* 162* 172* 165*   Imaging US Renal Port  07/11/2014   CLINICAL DATA:  Acute renal failure.  Chronic renal insufficiency.  EXAM: RENAL/URINARY TRACT ULTRASOUND COMPLETE  COMPARISON:  Abdomen CT dated 06/09/2014.  FINDINGS: Right Kidney:  Length: 11.8 cm. Normal wound shape and echotexture. Previously demonstrated 5 mm calculus in the lower pole. No hydronephrosis.  Left Kidney:  Length: 12.2 cm. Echogenicity within normal limits. No mass or hydronephrosis visualized.  Bladder:  Not visualized with a Foley catheter in place.  A small amount of free peritoneal fluid is noted in the right upper abdomen.  IMPRESSION: 1. No  hydronephrosis. 2. 5 mm nonobstructing lower pole right renal calculus. 3. Small amount of ascites in the right upper quadrant of the abdomen.   Electronically Signed   By: Gordan Payment M.D.   On: 07/11/2014 20:28   Dg Chest Port 1 View  07/11/2014   CLINICAL DATA:  Tube and line placement  EXAM: PORTABLE CHEST - 1 VIEW  COMPARISON:  07/11/2014 at 5:14 a.m.  FINDINGS: Endotracheal tube is appropriately positioned. Left IJ central line tip terminates at the brachiocephalic/ SVC junction. Nasogastric tube tip terminates below the level of the hemidiaphragms but is not included in the field of view. Pacing pads overlie the chest. Median sternotomy wires are reidentified. Mild enlargement of the cardiomediastinal silhouette is noted. There is new right lower lobe confluent opacification in the setting of underlying interstitial edema and small pleural effusions. No pneumothorax. Cervical fusion hardware partly visualized.  IMPRESSION: Endotracheal tube appropriately positioned.  Left IJ central line tip terminates over the brachiocephalic/SVC junction.  New confluent right lower lobe airspace opacity in the setting of underlying interstitial edema. Differential considerations include asymmetric edema, aspiration, pneumonia, or other alveolar filling processes.   Electronically Signed   By: Christiana Pellant M.D.   On: 07/11/2014 13:21   Dg Chest Port 1 View  07/11/2014   CLINICAL DATA:  Shortness of breath.  EXAM: PORTABLE CHEST - 1 VIEW  COMPARISON:  Chest radiograph performed 07/10/2014  FINDINGS: The lungs are well-aerated. Vascular congestion is noted. Bilateral central airspace opacities may reflect pulmonary edema or possibly multifocal pneumonia. Small bilateral pleural effusions are noted. No pneumothorax is seen.  The cardiomediastinal silhouette is borderline enlarged. The patient is status post median sternotomy. Cervical spinal fusion hardware is noted. No acute osseous abnormalities are seen.   IMPRESSION: Vascular congestion and borderline cardiomegaly. Bilateral central airspace opacities may reflect pulmonary edema or possibly multifocal pneumonia. Small bilateral pleural effusions noted.   Electronically Signed   By: Roanna Raider M.D.   On: 07/11/2014 05:36   Dg Abd Portable 1v  07/11/2014   CLINICAL DATA:  Orogastric tube placement.  EXAM: PORTABLE ABDOMEN - 1 VIEW  COMPARISON:  May 30, 2014.  FINDINGS: Distal tip of feeding tube is seen in expected position of distal stomach. No abnormal bowel gas pattern is noted.  IMPRESSION: Distal tip of feeding tube is seen in expected position of distal stomach. These results will be called to the ordering clinician or representative by the Radiologist Assistant, and communication documented in the PACS or zVision Dashboard.   Electronically Signed   By: Roque Lias M.D.   On: 07/11/2014 12:58     ASSESSMENT / PLAN:  PULMONARY OETT 9/14>>> A: acute respiratory failure s/p cardiopulm arrest.      Pulmonary infiltrates R>L  > favor pulm edema but with  leukocytosis  P:   Full vent support Increase PEEP to 10 F/u PCXR and ABG No hypothermia protocol   CARDIOVASCULAR CVL A:  VF/VT arrest. 10-15 minutes to ROSC  Circulatory shock: prelim echo OK... ? Sepsis vs cardiogenic  Pulmonary edema  P:  Vasopressors for MAP goal >65 D/ced levaquin given concern for qtc changes Dc antihypertensives Continue levophed and dopamine, CVP of 15  RENAL A:   Acute on chronic renal failure (baseline scr 1.5-2.0) Hyperkalemia > improving  P:   BMET in AM D/C bicarb gtt Renal consult in AM if no response to lasix, may need HD before this is over  GASTROINTESTINAL A:   Obesity  P:   Consult nutrition for TF PPI   HEMATOLOGIC A:   No acute issues   P:  Trend CBC and coags  Marks heparin   INFECTIOUS A:  Severe CAP? P:   BCx2 9/14>>> Sputum 9/14>>> Abx: levaquin, start date 9/14>>>9/14 ABX: doxy 9/14>>> ABX rocephin  9/14>>>  ENDOCRINE A:   DM P:   ssi protocol   NEUROLOGIC A:   Acute encephalopathy s/p cardiac arrest > improving P:   RASS goal: -4 Supportive care  Hold arctic sun protocol given improving mental status  TODAY'S SUMMARY:  Renal function deteriorating, UOP is negligible, oxygen demand rising and remains on pressors, will attempt lasix today, if fails then will likely need renal to see.  Increase PEEP to 10 and will f/u ABG in AM.   I have personally obtained a history, examined the patient, evaluated laboratory and imaging results, formulated the assessment and plan and placed orders.  CRITICAL CARE: The patient is critically ill with multiple organ systems failure and requires high complexity decision making for assessment and support, frequent evaluation and titration of therapies, application of advanced monitoring technologies and extensive interpretation of multiple databases. Critical Care Time devoted to patient care services described in this note is 35 minutes.   Alyson Reedy, M.D. [facility] Pulmonary/Critical Care Medicine. Pager: [phone]. After hours pager: [phone].  07/12/2014, 9:21 AM

## 2014-07-12 NOTE — Progress Notes (Signed)
OT Cancellation Note  Patient Details Name: Richard [name] MRN: [id number] DOB: 01/30/1961   Cancelled Treatment:    Reason Eval/Treat Not Completed: Medical issues which prohibited therapy. Pt coded yesterday. OT signing off. Please re-order when pt medically stable.   Earlie Raveling OTR/L [phone] 07/12/2014, 12:21 PM

## 2014-07-12 NOTE — Progress Notes (Signed)
Echocardiogram 2D Echocardiogram with Definity has been performed.  Richard [name] 07/12/2014, 9:45 AM

## 2014-07-12 NOTE — Progress Notes (Addendum)
INITIAL NUTRITION ASSESSMENT  DOCUMENTATION CODES Per approved criteria  -Morbid Obesity   INTERVENTION: Initiate Vital High Protein @ 20 ml/hr via OG tube and increase by 10 ml every 4 hours to goal rate of 40 ml/hr.   60 ml Prostat BID.    Tube feeding regimen provides 1360 kcal (67% of needs), 144 grams of protein, and 802 ml of H2O.    NUTRITION DIAGNOSIS: Inadequate oral intake related to inability to eat as evidenced by NPO status  Goal: Enteral nutrition to provide 60-70% of estimated calorie needs (22-25 kcals/kg ideal body weight) and 100% of estimated protein needs, based on ASPEN guidelines for permissive underfeeding in critically ill obese individuals  Monitor:  Respiratory status, TF initiation and tolerance, weight trend, labs  Reason for Assessment: Consult received to initiate and manage enteral nutrition support.  53 y.o. male  Admitting Dx: Cardiac arrest  ASSESSMENT: Pt transferred from [facility] with acute on chronic renal failure with hyperkalemia.  Patient is currently intubated on ventilator support after cardiopulmonary arrest. UOP is negligible, lasix ordered, may need renal consult. MV: 10.6 L/min Temp (24hrs), Avg:99.2 F (37.3 C), Min:98.5 F (36.9 C), Max:100.2 F (37.9 C)  No signs of fat or muscle depletion.   Height: Ht Readings from Last 1 Encounters:  07/11/14  (1.6 m)    Weight: Wt Readings from Last 1 Encounters:  07/12/14 226 lb 10.1 oz (102.8 kg)    Ideal Body Weight: 56.3 kg   % Ideal Body Weight: 183%  Wt Readings from Last 10 Encounters:  07/12/14 226 lb 10.1 oz (102.8 kg)    Usual Body Weight: unknown  % Usual Body Weight: -  BMI:  Body mass index is 40.16 kg/(m^2).  Estimated Nutritional Needs: Kcal: 2041 Protein: >/= 140 grams Fluid: >2 L/day  Skin: intact  Diet Order: NPO  EDUCATION NEEDS: -No education needs identified at this time   Intake/Output Summary (Last 24 hours) at  07/12/14 1456 Last data filed at 07/12/14 1200  Gross per 24 hour  Intake 966.23 ml  Output    750 ml  Net 216.23 ml    Last BM: 9/14 x 3   Labs:   Recent Labs Lab 07/11/14 1635 07/11/14 2240 07/12/14 0400  NA 136* 139 139  K 5.9* 5.2 4.9  CL 95* 98 98  CO2 BUN 83* 83* 83*  CREATININE 4.74* 4.87* 4.45*  CALCIUM 7.7* 7.7* 7.4*  GLUCOSE 221* 192* 162*    CBG (last 3)   Recent Labs  07/12/14 0415 07/12/14 0745 07/12/14 1126  GLUCAP 172* 165* 164*    Scheduled Meds: . albuterol  2.5 mg Nebulization Q6H  . antiseptic oral rinse  7 mL Mouth Rinse QID  . atorvastatin  20 mg Per Tube q1800  . cefTRIAXone (ROCEPHIN)  IV  1 g Intravenous Q24H  . chlorhexidine  15 mL Mouth Rinse BID  . doxycycline (VIBRAMYCIN) IV  100 mg Intravenous Q12H  . furosemide  40 mg Intravenous Q8H  . heparin subcutaneous  5,000 Units Subcutaneous 3 times per day  . insulin aspart  0-15 Units Subcutaneous 6 times per day  . midazolam  2 mg Intravenous Once  . pantoprazole (PROTONIX) IV  40 mg Intravenous Daily    Continuous Infusions: . sodium chloride 10 mL/hr at 07/12/14 1200  . DOPamine 1 mcg/kg/min (07/12/14 1200)  . fentaNYL infusion INTRAVENOUS 50 mcg/hr (07/12/14 1200)  . midazolam (VERSED) infusion    . norepinephrine (LEVOPHED)  Adult infusion 8.96 mcg/min (07/12/14 1200)    Past Medical History  Diagnosis Date  . CAD (coronary artery disease) of artery bypass graft 07/11/2014  . Cardiac arrest 07/11/2014  . CHF (congestive heart failure) 07/11/2014  . HTN (hypertension) 07/11/2014  . COPD (chronic obstructive pulmonary disease) 07/11/2014  . DM (diabetes mellitus) 07/11/2014  . GERD (gastroesophageal reflux disease) 07/11/2014  . Acute on chronic kidney failure 07/11/2014    No past surgical history on file.  Kendell Bane RD, LDN, CNSC [phone] Pager [phone] After Hours Pager

## 2014-07-12 NOTE — Progress Notes (Signed)
Subjective:  Pt intubated, sedated but alert and responsive. Day # witnessed cardiac arrest s/p resuscitation (ROSC 16 min)  Objective:  Temp:  [97.5 F (36.4 C)-100.2 F (37.9 C)] 100.2 F (37.9 C) (09/15 0720) Pulse Rate:  [68-121] 86 (09/15 0700) Resp:  [9-28] 20 (09/15 0700) BP: (62-139)/(14-68) 100/39 mmHg (09/14 2000) SpO2:  [94 %-100 %] 94 % (09/15 0700) Arterial Line BP: (76-181)/(34-78) 141/53 mmHg (09/15 0700) FiO2 (%):  [65 %-100 %] 65 % (09/15 0311) Weight:  [226 lb 10.1 oz (102.8 kg)] 226 lb 10.1 oz (102.8 kg) (09/15 0237) Weight change: 1 lb 1.6 oz (0.5 kg)  Intake/Output from previous day: 09/14 0701 - 09/15 0700 In: 1235.6 [I.V.:685.6; IV Piggyback:550] Out: 750 [Urine:750]  Intake/Output from this shift:    Physical Exam: General appearance: alert and no distress Neck: no adenopathy, no carotid bruit, no JVD, supple, symmetrical, trachea midline and thyroid not enlarged, symmetric, no tenderness/mass/nodules Lungs: clear to auscultation bilaterally Heart: regular rate and rhythm, S1, S2 normal, no murmur, click, rub or gallop Abdomen: soft, non-tender; bowel sounds normal; no masses,  no organomegaly Extremities: extremities normal, atraumatic, no cyanosis or edema  Lab Results: Results for orders placed during the hospital encounter of 07/11/14 (from the past 48 hour(s))  URINALYSIS, ROUTINE W REFLEX MICROSCOPIC     Status: Abnormal   Collection Time    07/11/14  4:58 AM      Result Value Ref Range   Color, Urine YELLOW  YELLOW   APPearance CLOUDY (*) CLEAR   Specific Gravity, Urine 1.021  1.005 - 1.030   pH 5.0  5.0 - 8.0   Glucose, UA NEGATIVE  NEGATIVE mg/dL   Hgb urine dipstick TRACE (*) NEGATIVE   Bilirubin Urine NEGATIVE  NEGATIVE   Ketones, ur NEGATIVE  NEGATIVE mg/dL   Protein, ur 30 (*) NEGATIVE mg/dL   Urobilinogen, UA 0.2  0.0 - 1.0 mg/dL   Nitrite NEGATIVE  NEGATIVE   Leukocytes, UA SMALL (*) NEGATIVE  URINE MICROSCOPIC-ADD ON      Status: Abnormal   Collection Time    07/11/14  4:58 AM      Result Value Ref Range   Squamous Epithelial / LPF FEW (*) RARE   WBC, UA 3-6  <3 WBC/hpf   RBC / HPF 3-6  <3 RBC/hpf   Bacteria, UA FEW (*) RARE   Casts HYALINE CASTS (*) NEGATIVE  GLUCOSE, CAPILLARY     Status: Abnormal   Collection Time    07/11/14  6:36 AM      Result Value Ref Range   Glucose-Capillary 166 (*) 70 - 99 mg/dL  TROPONIN I     Status: Abnormal   Collection Time    07/11/14  6:40 AM      Result Value Ref Range   Troponin I 0.64 (*) <0.30 ng/mL   Comment:            Due to the release kinetics of cTnI,     a negative result within the first hours     of the onset of symptoms does not rule out     myocardial infarction with certainty.     If myocardial infarction is still suspected,     repeat the test at appropriate intervals.     CRITICAL RESULT CALLED TO, READ BACK BY AND VERIFIED WITH:     LONG A RN 07/11/14 0752 COSTELLO B  BASIC METABOLIC PANEL     Status: Abnormal  Collection Time    07/11/14  6:40 AM      Result Value Ref Range   Sodium 137  137 - 147 mEq/L   Potassium 5.9 (*) 3.7 - 5.3 mEq/L   Chloride 96  96 - 112 mEq/L   CO2 24  19 - 32 mEq/L   Glucose, Bld 162 (*) 70 - 99 mg/dL   BUN 85 (*) 6 - 23 mg/dL   Creatinine, Ser 5.29 (*) 0.50 - 1.35 mg/dL   Calcium 7.7 (*) 8.4 - 10.5 mg/dL   GFR calc non Af Amer 11 (*) >90 mL/min   GFR calc Af Amer 13 (*) >90 mL/min   Comment: (NOTE)     The eGFR has been calculated using the CKD EPI equation.     This calculation has not been validated in all clinical situations.     eGFR's persistently <90 mL/min signify possible Chronic Kidney     Disease.   Anion gap 17 (*) 5 - 15  CBC     Status: Abnormal   Collection Time    07/11/14  6:40 AM      Result Value Ref Range   WBC 13.8 (*) 4.0 - 10.5 K/uL   RBC 4.77  4.22 - 5.81 MIL/uL   Hemoglobin 13.5  13.0 - 17.0 g/dL   HCT 42.7  39.0 - 52.0 %   MCV 89.5  78.0 - 100.0 fL   MCH 28.3  26.0 -  34.0 pg   MCHC 31.6  30.0 - 36.0 g/dL   RDW 17.5 (*) 11.5 - 15.5 %   Platelets 389  150 - 400 K/uL  PROTIME-INR     Status: None   Collection Time    07/11/14  6:40 AM      Result Value Ref Range   Prothrombin Time 14.2  11.6 - 15.2 seconds   INR 1.10  0.00 - 1.49  APTT     Status: Abnormal   Collection Time    07/11/14  6:40 AM      Result Value Ref Range   aPTT 38 (*) 24 - 37 seconds   Comment:            IF BASELINE aPTT IS ELEVATED,     SUGGEST PATIENT RISK ASSESSMENT     BE USED TO DETERMINE APPROPRIATE     ANTICOAGULANT THERAPY.  TROPONIN I     Status: Abnormal   Collection Time    07/11/14  9:15 AM      Result Value Ref Range   Troponin I 0.43 (*) <0.30 ng/mL   Comment:            Due to the release kinetics of cTnI,     a negative result within the first hours     of the onset of symptoms does not rule out     myocardial infarction with certainty.     If myocardial infarction is still suspected,     repeat the test at appropriate intervals.     CRITICAL VALUE NOTED.  VALUE IS CONSISTENT WITH PREVIOUSLY REPORTED AND CALLED VALUE.  CBC     Status: Abnormal   Collection Time    07/11/14  9:15 AM      Result Value Ref Range   WBC 19.0 (*) 4.0 - 10.5 K/uL   RBC 4.81  4.22 - 5.81 MIL/uL   Hemoglobin 13.8  13.0 - 17.0 g/dL   HCT 43.7  39.0 - 52.0 %   MCV  90.9  78.0 - 100.0 fL   MCH 28.7  26.0 - 34.0 pg   MCHC 31.6  30.0 - 36.0 g/dL   RDW 17.7 (*) 11.5 - 15.5 %   Platelets 374  150 - 400 K/uL  LACTIC ACID, PLASMA     Status: Abnormal   Collection Time    07/11/14  9:15 AM      Result Value Ref Range   Lactic Acid, Venous 3.1 (*) 0.5 - 2.2 mmol/L  AMMONIA     Status: None   Collection Time    07/11/14  9:15 AM      Result Value Ref Range   Ammonia 39  11 - 60 umol/L  COMPREHENSIVE METABOLIC PANEL     Status: Abnormal   Collection Time    07/11/14  9:15 AM      Result Value Ref Range   Sodium 137  137 - 147 mEq/L   Potassium 4.7  3.7 - 5.3 mEq/L   Comment:  HEMOLYSIS AT THIS LEVEL MAY AFFECT RESULT   Chloride 94 (*) 96 - 112 mEq/L   CO2 24  19 - 32 mEq/L   Glucose, Bld 232 (*) 70 - 99 mg/dL   BUN 82 (*) 6 - 23 mg/dL   Creatinine, Ser 5.33 (*) 0.50 - 1.35 mg/dL   Calcium 8.9  8.4 - 10.5 mg/dL   Total Protein 6.5  6.0 - 8.3 g/dL   Albumin 3.4 (*) 3.5 - 5.2 g/dL   AST 24  0 - 37 U/L   Comment: HEMOLYSIS AT THIS LEVEL MAY AFFECT RESULT   ALT 39  0 - 53 U/L   Comment: HEMOLYSIS AT THIS LEVEL MAY AFFECT RESULT   Alkaline Phosphatase 113  39 - 117 U/L   Total Bilirubin 0.3  0.3 - 1.2 mg/dL   GFR calc non Af Amer 11 (*) >90 mL/min   GFR calc Af Amer 13 (*) >90 mL/min   Comment: (NOTE)     The eGFR has been calculated using the CKD EPI equation.     This calculation has not been validated in all clinical situations.     eGFR's persistently <90 mL/min signify possible Chronic Kidney     Disease.   Anion gap 19 (*) 5 - 15  MRSA PCR SCREENING     Status: None   Collection Time    07/11/14  9:46 AM      Result Value Ref Range   MRSA by PCR NEGATIVE  NEGATIVE   Comment:            The GeneXpert MRSA Assay (FDA     approved for NASAL specimens     only), is one component of a     comprehensive MRSA colonization     surveillance program. It is not     intended to diagnose MRSA     infection nor to guide or     monitor treatment for     MRSA infections.  POCT I-STAT 3, ART BLOOD GAS (G3+)     Status: Abnormal   Collection Time    07/11/14 10:49 AM      Result Value Ref Range   pH, Arterial 7.278 (*) 7.350 - 7.450   pCO2 arterial 46.9 (*) 35.0 - 45.0 mmHg   pO2, Arterial 181.0 (*) 80.0 - 100.0 mmHg   Bicarbonate 21.9  20.0 - 24.0 mEq/L   TCO2 23  0 - 100 mmol/L   O2 Saturation 99.0  Acid-base deficit 5.0 (*) 0.0 - 2.0 mmol/L   Patient temperature 98.6 F     Collection site RADIAL, ALLEN'S TEST ACCEPTABLE     Sample type ARTERIAL    GLUCOSE, CAPILLARY     Status: Abnormal   Collection Time    07/11/14 11:11 AM      Result Value  Ref Range   Glucose-Capillary 206 (*) 70 - 99 mg/dL  POTASSIUM     Status: Abnormal   Collection Time    07/11/14 12:00 PM      Result Value Ref Range   Potassium 5.9 (*) 3.7 - 5.3 mEq/L  PROCALCITONIN     Status: None   Collection Time    07/11/14 12:00 PM      Result Value Ref Range   Procalcitonin 0.42     Comment:            Interpretation:     PCT (Procalcitonin) <= 0.5 ng/mL:     Systemic infection (sepsis) is not likely.     Local bacterial infection is possible.     (NOTE)             ICU PCT Algorithm               Non ICU PCT Algorithm        ----------------------------     ------------------------------             PCT < 0.25 ng/mL                 PCT < 0.1 ng/mL         Stopping of antibiotics            Stopping of antibiotics           strongly encouraged.               strongly encouraged.        ----------------------------     ------------------------------           PCT level decrease by               PCT < 0.25 ng/mL           >= 80% from peak PCT           OR PCT 0.25 - 0.5 ng/mL          Stopping of antibiotics                                                 encouraged.         Stopping of antibiotics               encouraged.        ----------------------------     ------------------------------           PCT level decrease by              PCT >= 0.25 ng/mL           < 80% from peak PCT            AND PCT >= 0.5 ng/mL            Continuing antibiotics  encouraged.           Continuing antibiotics                encouraged.        ----------------------------     ------------------------------         PCT level increase compared          PCT > 0.5 ng/mL             with peak PCT AND              PCT >= 0.5 ng/mL             Escalation of antibiotics                                              strongly encouraged.          Escalation of antibiotics            strongly encouraged.  CARBOXYHEMOGLOBIN      Status: Abnormal   Collection Time    07/11/14 12:07 PM      Result Value Ref Range   Total hemoglobin 13.6  13.5 - 18.0 g/dL   O2 Saturation 92.0     Carboxyhemoglobin 1.9 (*) 0.5 - 1.5 %   Methemoglobin 0.9  0.0 - 1.5 %  BASIC METABOLIC PANEL     Status: Abnormal   Collection Time    07/11/14  4:35 PM      Result Value Ref Range   Sodium 136 (*) 137 - 147 mEq/L   Potassium 5.9 (*) 3.7 - 5.3 mEq/L   Chloride 95 (*) 96 - 112 mEq/L   CO2 21  19 - 32 mEq/L   Glucose, Bld 221 (*) 70 - 99 mg/dL   BUN 83 (*) 6 - 23 mg/dL   Creatinine, Ser 4.74 (*) 0.50 - 1.35 mg/dL   Calcium 7.7 (*) 8.4 - 10.5 mg/dL   GFR calc non Af Amer 13 (*) >90 mL/min   GFR calc Af Amer 15 (*) >90 mL/min   Comment: (NOTE)     The eGFR has been calculated using the CKD EPI equation.     This calculation has not been validated in all clinical situations.     eGFR's persistently <90 mL/min signify possible Chronic Kidney     Disease.   Anion gap 20 (*) 5 - 15  GLUCOSE, CAPILLARY     Status: Abnormal   Collection Time    07/11/14  4:36 PM      Result Value Ref Range   Glucose-Capillary 212 (*) 70 - 99 mg/dL   Comment 1 Arterial Sample    GLUCOSE, CAPILLARY     Status: Abnormal   Collection Time    07/11/14  7:32 PM      Result Value Ref Range   Glucose-Capillary 173 (*) 70 - 99 mg/dL   Comment 1 Capillary Sample    TROPONIN I     Status: Abnormal   Collection Time    07/11/14 10:40 PM      Result Value Ref Range   Troponin I 1.28 (*) <0.30 ng/mL   Comment:            Due to the release kinetics of cTnI,     a negative result within the first hours     of the onset  of symptoms does not rule out     myocardial infarction with certainty.     If myocardial infarction is still suspected,     repeat the test at appropriate intervals.     CRITICAL VALUE NOTED.  VALUE IS CONSISTENT WITH PREVIOUSLY REPORTED AND CALLED VALUE.  BASIC METABOLIC PANEL     Status: Abnormal   Collection Time    07/11/14 10:40 PM        Result Value Ref Range   Sodium 139  137 - 147 mEq/L   Potassium 5.2  3.7 - 5.3 mEq/L   Chloride 98  96 - 112 mEq/L   CO2 22  19 - 32 mEq/L   Glucose, Bld 192 (*) 70 - 99 mg/dL   BUN 83 (*) 6 - 23 mg/dL   Creatinine, Ser 4.87 (*) 0.50 - 1.35 mg/dL   Calcium 7.7 (*) 8.4 - 10.5 mg/dL   GFR calc non Af Amer 12 (*) >90 mL/min   GFR calc Af Amer 14 (*) >90 mL/min   Comment: (NOTE)     The eGFR has been calculated using the CKD EPI equation.     This calculation has not been validated in all clinical situations.     eGFR's persistently <90 mL/min signify possible Chronic Kidney     Disease.   Anion gap 19 (*) 5 - 15  GLUCOSE, CAPILLARY     Status: Abnormal   Collection Time    07/12/14 12:04 AM      Result Value Ref Range   Glucose-Capillary 162 (*) 70 - 99 mg/dL   Comment 1 Capillary Sample    BLOOD GAS, ARTERIAL     Status: Abnormal   Collection Time    07/12/14  3:19 AM      Result Value Ref Range   FIO2 0.65     Delivery systems VENTILATOR     Mode PRESSURE REGULATED VOLUME CONTROL     VT 460     Rate 20     Peep/cpap 5.0     pH, Arterial 7.264 (*) 7.350 - 7.450   pCO2 arterial 53.1 (*) 35.0 - 45.0 mmHg   pO2, Arterial 77.0 (*) 80.0 - 100.0 mmHg   Bicarbonate 23.3  20.0 - 24.0 mEq/L   TCO2 24.9  0 - 100 mmol/L   Acid-base deficit 2.7 (*) 0.0 - 2.0 mmol/L   O2 Saturation 93.6     Patient temperature 98.6     Collection site A-LINE     Drawn by [phone]     Sample type ARTERIAL DRAW     Allens test (pass/fail) PASS  PASS  TROPONIN I     Status: Abnormal   Collection Time    07/12/14  4:00 AM      Result Value Ref Range   Troponin I 1.14 (*) <0.30 ng/mL   Comment:            Due to the release kinetics of cTnI,     a negative result within the first hours     of the onset of symptoms does not rule out     myocardial infarction with certainty.     If myocardial infarction is still suspected,     repeat the test at appropriate intervals.     CRITICAL VALUE NOTED.   VALUE IS CONSISTENT WITH PREVIOUSLY REPORTED AND CALLED VALUE.  CBC     Status: Abnormal   Collection Time    07/12/14  4:00 AM  Result Value Ref Range   WBC 14.0 (*) 4.0 - 10.5 K/uL   RBC 4.37  4.22 - 5.81 MIL/uL   Hemoglobin 12.2 (*) 13.0 - 17.0 g/dL   HCT 38.0 (*) 39.0 - 52.0 %   MCV 87.0  78.0 - 100.0 fL   MCH 27.9  26.0 - 34.0 pg   MCHC 32.1  30.0 - 36.0 g/dL   RDW 17.4 (*) 11.5 - 15.5 %   Platelets 307  150 - 400 K/uL  COMPREHENSIVE METABOLIC PANEL     Status: Abnormal   Collection Time    07/12/14  4:00 AM      Result Value Ref Range   Sodium 139  137 - 147 mEq/L   Potassium 4.9  3.7 - 5.3 mEq/L   Chloride 98  96 - 112 mEq/L   CO2 23  19 - 32 mEq/L   Glucose, Bld 162 (*) 70 - 99 mg/dL   BUN 83 (*) 6 - 23 mg/dL   Creatinine, Ser 4.45 (*) 0.50 - 1.35 mg/dL   Calcium 7.4 (*) 8.4 - 10.5 mg/dL   Total Protein 5.9 (*) 6.0 - 8.3 g/dL   Albumin 3.0 (*) 3.5 - 5.2 g/dL   AST 20  0 - 37 U/L   ALT 34  0 - 53 U/L   Alkaline Phosphatase 89  39 - 117 U/L   Total Bilirubin 0.3  0.3 - 1.2 mg/dL   GFR calc non Af Amer 14 (*) >90 mL/min   GFR calc Af Amer 16 (*) >90 mL/min   Comment: (NOTE)     The eGFR has been calculated using the CKD EPI equation.     This calculation has not been validated in all clinical situations.     eGFR's persistently <90 mL/min signify possible Chronic Kidney     Disease.   Anion gap 18 (*) 5 - 15  PROCALCITONIN     Status: None   Collection Time    07/12/14  4:00 AM      Result Value Ref Range   Procalcitonin 2.53     Comment:            Interpretation:     PCT > 2 ng/mL:     Systemic infection (sepsis) is likely,     unless other causes are known.     (NOTE)             ICU PCT Algorithm               Non ICU PCT Algorithm        ----------------------------     ------------------------------             PCT < 0.25 ng/mL                 PCT < 0.1 ng/mL         Stopping of antibiotics            Stopping of antibiotics           strongly  encouraged.               strongly encouraged.        ----------------------------     ------------------------------           PCT level decrease by               PCT < 0.25 ng/mL           >= 80% from peak  PCT           OR PCT 0.25 - 0.5 ng/mL          Stopping of antibiotics                                                 encouraged.         Stopping of antibiotics               encouraged.        ----------------------------     ------------------------------           PCT level decrease by              PCT >= 0.25 ng/mL           < 80% from peak PCT            AND PCT >= 0.5 ng/mL            Continuing antibiotics                                                  encouraged.           Continuing antibiotics                encouraged.        ----------------------------     ------------------------------         PCT level increase compared          PCT > 0.5 ng/mL             with peak PCT AND              PCT >= 0.5 ng/mL             Escalation of antibiotics                                              strongly encouraged.          Escalation of antibiotics            strongly encouraged.  GLUCOSE, CAPILLARY     Status: Abnormal   Collection Time    07/12/14  4:15 AM      Result Value Ref Range   Glucose-Capillary 172 (*) 70 - 99 mg/dL  GLUCOSE, CAPILLARY     Status: Abnormal   Collection Time    07/12/14  7:45 AM      Result Value Ref Range   Glucose-Capillary 165 (*) 70 - 99 mg/dL   Comment 1 Capillary Sample      Imaging: Imaging results have been reviewed  Assessment/Plan:   1. Principal Problem: 2.   Cardiac arrest 3. Active Problems: 4.   Acute on chronic kidney failure 5.   Hyperkalemia 6.   CHF (congestive heart failure) 7.   DM (diabetes mellitus) 8.   CAD (coronary artery disease) of artery bypass graft 9.   HTN (hypertension) 10.   COPD (chronic obstructive pulmonary disease) 11.   GERD (gastroesophageal reflux disease) 12.   Acute  encephalopathy 13.  CAP (community acquired pneumonia) 36.   Time Spent Directly with Patient:  25 minutes  Length of Stay:  LOS: 1 day   Day #  1 SCD/Arrest s/p resuscitation with ROSC 16 min. Witnessed arrest. Pt has ? History of CAD s/p CABG.  2D was interrupted so we don't know his LV function. Pt's initial K was 7 Rx appropriately with K 4.9. UOP 55 cc. SCr 4.45 ( don't know his baseline). Pt awake and alert. On Dop and Levophed (BP drops when weaned dop. On IV ATBX. Will re check 2 D echo. Vent and pressor wean per PCCM.  Lorretta Harp 07/12/2014, 8:25 AM

## 2014-07-12 NOTE — Progress Notes (Signed)
PT Cancellation Note  Patient Details Name: Richard [name] MRN: [id number] DOB: 1960/12/07   Cancelled Treatment:    Reason Eval/Treat Not Completed: Medical issues which prohibited therapy (Pt coded yesterday. PT signing off. Please re-order when pt medically stable. )   Ralene Bathe Kistler 07/12/2014, 12:12 PM [phone]

## 2014-07-13 ENCOUNTER — Inpatient Hospital Stay (HOSPITAL_COMMUNITY): Payer: Medicaid Other

## 2014-07-13 LAB — CBC
HCT: 37.2 % — ABNORMAL LOW (ref 39.0–52.0)
HEMOGLOBIN: 11.8 g/dL — AB (ref 13.0–17.0)
MCH: 27.7 pg (ref 26.0–34.0)
MCHC: 31.7 g/dL (ref 30.0–36.0)
MCV: 87.3 fL (ref 78.0–100.0)
Platelets: 327 10*3/uL (ref 150–400)
RBC: 4.26 MIL/uL (ref 4.22–5.81)
RDW: 17.5 % — ABNORMAL HIGH (ref 11.5–15.5)
WBC: 11.5 10*3/uL — AB (ref 4.0–10.5)

## 2014-07-13 LAB — GLUCOSE, CAPILLARY
GLUCOSE-CAPILLARY: 260 mg/dL — AB (ref 70–99)
GLUCOSE-CAPILLARY: 251 mg/dL — AB (ref 70–99)
GLUCOSE-CAPILLARY: 233 mg/dL — AB (ref 70–99)
GLUCOSE-CAPILLARY: 212 mg/dL — AB (ref 70–99)
GLUCOSE-CAPILLARY: 239 mg/dL — AB (ref 70–99)
GLUCOSE-CAPILLARY: 282 mg/dL — AB (ref 70–99)
GLUCOSE-CAPILLARY: 268 mg/dL — AB (ref 70–99)
GLUCOSE-CAPILLARY: 195 mg/dL — AB (ref 70–99)
GLUCOSE-CAPILLARY: 166 mg/dL — AB (ref 70–99)
GLUCOSE-CAPILLARY: 147 mg/dL — AB (ref 70–99)
GLUCOSE-CAPILLARY: 142 mg/dL — AB (ref 70–99)
GLUCOSE-CAPILLARY: 169 mg/dL — AB (ref 70–99)
GLUCOSE-CAPILLARY: 190 mg/dL — AB (ref 70–99)
Glucose-Capillary: 259 mg/dL — ABNORMAL HIGH (ref 70–99)
Glucose-Capillary: 250 mg/dL — ABNORMAL HIGH (ref 70–99)
Glucose-Capillary: 164 mg/dL — ABNORMAL HIGH (ref 70–99)
Glucose-Capillary: 140 mg/dL — ABNORMAL HIGH (ref 70–99)
Glucose-Capillary: 140 mg/dL — ABNORMAL HIGH (ref 70–99)
Glucose-Capillary: 137 mg/dL — ABNORMAL HIGH (ref 70–99)

## 2014-07-13 LAB — BLOOD GAS, ARTERIAL
Acid-Base Excess: 0.1 mmol/L (ref 0.0–2.0)
BICARBONATE: 25.1 meq/L — AB (ref 20.0–24.0)
Drawn by: 41934
FIO2: 0.6 %
MECHVT: 460 mL
O2 SAT: 96.3 %
PEEP: 10 cmH2O
Patient temperature: 98.6
RATE: 20 resp/min
TCO2: 26.5 mmol/L (ref 0–100)
pCO2 arterial: 47.2 mmHg — ABNORMAL HIGH (ref 35.0–45.0)
pH, Arterial: 7.344 — ABNORMAL LOW (ref 7.350–7.450)
pO2, Arterial: 88.6 mmHg (ref 80.0–100.0)

## 2014-07-13 LAB — PHOSPHORUS: PHOSPHORUS: 4.3 mg/dL (ref 2.3–4.6)

## 2014-07-13 LAB — BASIC METABOLIC PANEL
ANION GAP: 16 — AB (ref 5–15)
BUN: 81 mg/dL — ABNORMAL HIGH (ref 6–23)
CO2: 25 meq/L (ref 19–32)
CREATININE: 3.03 mg/dL — AB (ref 0.50–1.35)
Calcium: 7.6 mg/dL — ABNORMAL LOW (ref 8.4–10.5)
Chloride: 101 mEq/L (ref 96–112)
GFR calc non Af Amer: 22 mL/min — ABNORMAL LOW (ref >90)
GFR, EST AFRICAN AMERICAN: 25 mL/min — AB (ref >90)
Glucose, Bld: 270 mg/dL — ABNORMAL HIGH (ref 70–99)
POTASSIUM: 4.4 meq/L (ref 3.7–5.3)
Sodium: 142 mEq/L (ref 137–147)

## 2014-07-13 LAB — MAGNESIUM: MAGNESIUM: 1.4 mg/dL — AB (ref 1.5–2.5)

## 2014-07-13 LAB — PROCALCITONIN: Procalcitonin: 1.56 ng/mL

## 2014-07-13 MED ORDER — FUROSEMIDE 10 MG/ML IJ SOLN
5.0000 mg/h | INTRAMUSCULAR | Status: AC
  Administered 2014-07-13: 5 mg/h via INTRAVENOUS
  Filled 2014-07-13: qty 25

## 2014-07-13 MED ORDER — SODIUM CHLORIDE 0.9 % IV SOLN
INTRAVENOUS | Status: DC
  Administered 2014-07-13: 2 [IU]/h via INTRAVENOUS
  Administered 2014-07-14: 7.8 [IU]/h via INTRAVENOUS
  Administered 2014-07-17: 7.9 [IU]/h via INTRAVENOUS
  Administered 2014-07-17: 7.8 [IU]/h via INTRAVENOUS
  Filled 2014-07-13 (×5): qty 2.5

## 2014-07-13 NOTE — Progress Notes (Signed)
PULMONARY / CRITICAL CARE MEDICINE   Name: Richard [name] MRN: [id number] DOB: [birth year]    ADMISSION DATE:  07/11/2014 CONSULTATION DATE: 9/14  REFERRING MD :  Benjamine Mola  CHIEF COMPLAINT:  Cardiac arrest.   INITIAL PRESENTATION:  53 year old male transferred to [facility] from [place] on 9/14 w/ working dx of acute on chronic renal failure, hyperkalemia, encephalopathy and possible CAP. He was in ECHO when suddenly developed VF/VT arrest. ROSC estimated at 10-15 minutes. PCCM asked to assist w/ care s/p arrest.   STUDIES:  9/14 ECHO>>> 9/14 renal US>>>  SIGNIFICANT EVENTS: 9/14: admitted w/ acute on chronic renal failure, hyperkalemia and encephalopathy  9/14: cardiac arrest (VF/VT) while in echo  SUBJECTIVE:  Not following commands on vent, remains on pressors.  VITAL SIGNS: Temp:  [99 F (37.2 C)-100.2 F (37.9 C)] 99.3 F (37.4 C) (09/16 0322) Pulse Rate:  [75-109] 87 (09/16 0630) Resp:  [20-24] 20 (09/16 0630) SpO2:  [93 %-99 %] 97 % (09/16 0630) Arterial Line BP: (68-162)/(34-62) 96/48 mmHg (09/16 0630) FiO2 (%):  [60 %] 60 % (09/16 0322) Weight:  [102.7 kg (226 lb 6.6 oz)] 102.7 kg (226 lb 6.6 oz) (09/16 0416)  HEMODYNAMICS:    VENTILATOR SETTINGS: Vent Mode:  [-] PRVC FiO2 (%):  [60 %] 60 % Set Rate:  [20 bmp] 20 bmp Vt Set:  [460 mL] 460 mL PEEP:  [5 cmH20-10 cmH20] 10 cmH20 Plateau Pressure:  [24 cmH20-27 cmH20] 25 cmH20  INTAKE / OUTPUT:  Intake/Output Summary (Last 24 hours) at 07/13/14 0715 Last data filed at 07/13/14 0600  Gross per 24 hour  Intake 2567.95 ml  Output   1300 ml  Net 1267.95 ml   PHYSICAL EXAMINATION: General: Obese 53 year old male, on vent s/p arrest not following much commands Neuro: Sedated, agitated at times  HEENT: Orally intubated. Residual bloody oral secretions. Neck large no clear JVD Cardiovascular: Distant, RRR, Nl S1/S2, -M/R/G. Lungs: Coarse BS diffusely. Abdomen: Soft, non-tender. + bowel sounds  obese Musculoskeletal:  Intact  Skin:  LE edema   LABS:  CBC  Recent Labs Lab 07/11/14 0915 07/12/14 0400 07/13/14 0435  WBC 19.0* 14.0* 11.5*  HGB 13.8 12.2* 11.8*  HCT 43.7 38.0* 37.2*  PLT 374 307 327     Coag's  Recent Labs Lab 07/11/14 0640  APTT 38*  INR 1.10   BMET  Recent Labs Lab 07/11/14 2240 07/12/14 0400 07/13/14 0435  NA 139 139 142  K 5.2 4.9 4.4  CL 98 98 101  CO2 BUN 83* 83* 81*  CREATININE 4.87* 4.45* 3.03*  GLUCOSE 192* 162* 270*   Electrolytes  Recent Labs Lab 07/11/14 2240 07/12/14 0400 07/13/14 0435  CALCIUM 7.7* 7.4* 7.6*  MG  --   --  1.4*  PHOS  --   --  4.3   Sepsis Markers  Recent Labs Lab 07/11/14 0915 07/11/14 1200 07/12/14 0400 07/13/14 0435  LATICACIDVEN 3.1*  --   --   --   PROCALCITON  --  0.42 2.53 1.56   ABG   Recent Labs Lab 07/11/14 1049 07/12/14 0319 07/13/14 0522  PHART 7.278* 7.264* 7.344*  PCO2ART 46.9* 53.1* 47.2*  PO2ART 181.0* 77.0* 88.6   Liver Enzymes  Recent Labs Lab 07/11/14 0915 07/12/14 0400  AST 24 20  ALT 39 34  ALKPHOS 113 89  BILITOT 0.3 0.3  ALBUMIN 3.4* 3.0*   Cardiac Enzymes  Recent Labs Lab 07/11/14 0915 07/11/14 2240 07/12/14 0400  TROPONINI 0.43* 1.28* 1.14*   Glucose  Recent Labs Lab 07/12/14 1517 07/12/14 2040 07/12/14 2340 07/12/14 2346 07/13/14 0400 07/13/14 0559  GLUCAP 196* 233* 247* 268* 260* 259*   Imaging Dg Chest Port 1 View  07/12/2014   CLINICAL DATA:  Check endotracheal tube  EXAM: PORTABLE CHEST - 1 VIEW  COMPARISON:  Radiograph 07/11/2014  FINDINGS: Endotracheal tube is 2.7 cm from carina. NG tube extends to the stomach. Left central venous line is unchanged. Stable cardiac silhouette sternotomy wires noted. There is some improvement in right lower lobe opacity which could indicate clearing pleural fluid. There is a small right effusion noted. Bibasilar atelectasis. No pneumothorax.  IMPRESSION: 1. Stable support apparatus. 2.  Improved right lower lobe opacity likely represents clearing pleural fluid. 3. Bibasilar atelectasis and small effusions.   Electronically Signed   By: Genevive Bi M.D.   On: 07/12/2014 07:22     ASSESSMENT / PLAN:  PULMONARY OETT 9/14>>> A: acute respiratory failure s/p cardiopulm arrest.      Pulmonary infiltrates R>L  > favor pulm edema but with leukocytosis  P:   Continue full vent support Maintain PEEP at 10 cmH2O F/u PCXR and ABG in AM No hypothermia protocol   CARDIOVASCULAR CVL A:  VF/VT arrest. 10-15 minutes to ROSC  Circulatory shock: prelim echo OK... ? Sepsis vs cardiogenic  Pulmonary edema  P:  Vasopressors for MAP goal >65, will decrease dopamine given tachycardia this AM D/ced levaquin given concern for qtc changes Dc antihypertensives Continue levophed and attempt to decrease dopamine, CVP of 12  RENAL A:   Acute on chronic renal failure (baseline scr 1.5-2.0) Hyperkalemia > improving  P:   BMET in AM D/Ced bicarb gtt Will start low dose lasix drip and defer use to renal after they see patient. Renal consult called 9/16, ?CVVH but no indication for acute dialysis at this time, will defer to renal  GASTROINTESTINAL A:   Obesity  P:   TF per nutrition PPI   HEMATOLOGIC A:   No acute issues   P:  Trend CBC and coags  [place] heparin   INFECTIOUS A:  Severe CAP? P:   BCx2 9/14>>> Sputum 9/14>>> Abx: levaquin, start date 9/14>>>9/14 ABX: doxy 9/14>>> ABX rocephin 9/14>>>  ENDOCRINE A:   DM P:   SSI protocol  CBGs  NEUROLOGIC A:   Acute encephalopathy s/p cardiac arrest > improving P:   RASS goal: 0 at this point Supportive care  Hold arctic sun protocol given improving mental status EEG will be deferred for now as I suspect AMS is sedation and renal failure related  TODAY'S SUMMARY:  UOP improved with lasix, will call renal, continue full vent support, will require aggressive diureses prior to serious consideration for  weaning, issue is that the patient has no direct family to make decision, will ask case management to address today.   Maintain PEEP at 10 until able to diurese.  Will start low dose lasix drip today.  I have personally obtained a history, examined the patient, evaluated laboratory and imaging results, formulated the assessment and plan and placed orders.  CRITICAL CARE: The patient is critically ill with multiple organ systems failure and requires high complexity decision making for assessment and support, frequent evaluation and titration of therapies, application of advanced monitoring technologies and extensive interpretation of multiple databases. Critical Care Time devoted to patient care services described in this note is 35 minutes.   Alyson Reedy, M.D. [facility] Pulmonary/Critical Care Medicine. Pager:  [phone]. After hours pager: [phone].  07/13/2014, 7:15 AM

## 2014-07-13 NOTE — Progress Notes (Signed)
Weaned peep to 5 per MD order.

## 2014-07-13 NOTE — Progress Notes (Signed)
Subjective:  VDRF, intubated and sedated but alert and follows commands  Objective:  Temp:  [99 F (37.2 C)-99.5 F (37.5 C)] 99.5 F (37.5 C) (09/16 0745) Pulse Rate:  [75-109] 88 (09/16 0900) Resp:  [18-23] 20 (09/16 0900) BP: (94-110)/(45-51) 94/47 mmHg (09/16 0900) SpO2:  [94 %-99 %] 97 % (09/16 0900) Arterial Line BP: (68-162)/(34-62) 88/44 mmHg (09/16 0900) FiO2 (%):  [60 %] 60 % (09/16 0832) Weight:  [226 lb 6.6 oz (102.7 kg)] 226 lb 6.6 oz (102.7 kg) (09/16 0416) Weight change: -3.5 oz (-0.1 kg)  Intake/Output from previous day: 09/15 0701 - 09/16 0700 In: 2650.6 [I.V.:610.6; NG/GT:510; IV Piggyback:500] Out: 1300 [Urine:1300]  Intake/Output from this shift: Total I/O In: 83.2 [I.V.:43.2; NG/GT:40] Out: 125 [Urine:125]  Physical Exam: General appearance: alert and no distress Neck: no adenopathy, no carotid bruit, no JVD, supple, symmetrical, trachea midline and thyroid not enlarged, symmetric, no tenderness/mass/nodules Lungs: clear to auscultation bilaterally Heart: regular rate and rhythm, S1, S2 normal, no murmur, click, rub or gallop Extremities: extremities normal, atraumatic, no cyanosis or edema  Lab Results: Results for orders placed during the hospital encounter of 07/11/14 (from the past 48 hour(s))  MRSA PCR SCREENING     Status: None   Collection Time    07/11/14  9:46 AM      Result Value Ref Range   MRSA by PCR NEGATIVE  NEGATIVE   Comment:            The GeneXpert MRSA Assay (FDA     approved for NASAL specimens     only), is one component of a     comprehensive MRSA colonization     surveillance program. It is not     intended to diagnose MRSA     infection nor to guide or     monitor treatment for     MRSA infections.  POCT I-STAT 3, ART BLOOD GAS (G3+)     Status: Abnormal   Collection Time    07/11/14 10:49 AM      Result Value Ref Range   pH, Arterial 7.278 (*) 7.350 - 7.450   pCO2 arterial 46.9 (*) 35.0 - 45.0 mmHg   pO2,  Arterial 181.0 (*) 80.0 - 100.0 mmHg   Bicarbonate 21.9  20.0 - 24.0 mEq/L   TCO2 23  0 - 100 mmol/L   O2 Saturation 99.0     Acid-base deficit 5.0 (*) 0.0 - 2.0 mmol/L   Patient temperature 98.6 F     Collection site RADIAL, ALLEN'S TEST ACCEPTABLE     Sample type ARTERIAL    CULTURE, BLOOD (ROUTINE X 2)     Status: None   Collection Time    07/11/14 11:10 AM      Result Value Ref Range   Specimen Description BLOOD LEFT THUMB     Special Requests BOTTLES DRAWN AEROBIC ONLY 2CC     Culture  Setup Time       Value: 07/11/2014 14:31     Performed at [facility]   Culture       Value:        BLOOD CULTURE RECEIVED NO GROWTH TO DATE CULTURE WILL BE HELD FOR 5 DAYS BEFORE ISSUING A FINAL NEGATIVE REPORT     Note: Culture results may be compromised due to an inadequate volume of blood received in culture bottles.     Performed at [facility],  [facility]     Status: Abnormal   Collection Time    07/11/14 11:11 AM      Result Value Ref Range   Glucose-Capillary 206 (*) 70 - 99 mg/dL  CULTURE, BLOOD (ROUTINE X 2)     Status: None   Collection Time    07/11/14 11:20 AM      Result Value Ref Range   Specimen Description BLOOD CENTRAL LINE     Special Requests BOTTLES DRAWN AEROBIC AND ANAEROBIC 10CC     Culture  Setup Time       Value: 07/11/2014 14:31     Performed at [facility]   Culture       Value:        BLOOD CULTURE RECEIVED NO GROWTH TO DATE CULTURE WILL BE HELD FOR 5 DAYS BEFORE ISSUING A FINAL NEGATIVE REPORT     Performed at [facility]   Report Status PENDING    POTASSIUM     Status: Abnormal   Collection Time    07/11/14 12:00 PM      Result Value Ref Range   Potassium 5.9 (*) 3.7 - 5.3 mEq/L  PROCALCITONIN     Status: None   Collection Time    07/11/14 12:00 PM      Result Value Ref Range   Procalcitonin 0.42     Comment:            Interpretation:     PCT (Procalcitonin) <= 0.5 ng/mL:      Systemic infection (sepsis) is not likely.     Local bacterial infection is possible.     (NOTE)             ICU PCT Algorithm               Non ICU PCT Algorithm        ----------------------------     ------------------------------             PCT < 0.25 ng/mL                 PCT < 0.1 ng/mL         Stopping of antibiotics            Stopping of antibiotics           strongly encouraged.               strongly encouraged.        ----------------------------     ------------------------------           PCT level decrease by               PCT < 0.25 ng/mL           >= 80% from peak PCT           OR PCT 0.25 - 0.5 ng/mL          Stopping of antibiotics                                                 encouraged.         Stopping of antibiotics               encouraged.        ----------------------------     ------------------------------  PCT level decrease by              PCT >= 0.25 ng/mL           < 80% from peak PCT            AND PCT >= 0.5 ng/mL            Continuing antibiotics                                                  encouraged.           Continuing antibiotics                encouraged.        ----------------------------     ------------------------------         PCT level increase compared          PCT > 0.5 ng/mL             with peak PCT AND              PCT >= 0.5 ng/mL             Escalation of antibiotics                                              strongly encouraged.          Escalation of antibiotics            strongly encouraged.  CARBOXYHEMOGLOBIN     Status: Abnormal   Collection Time    07/11/14 12:07 PM      Result Value Ref Range   Total hemoglobin 13.6  13.5 - 18.0 g/dL   O2 Saturation 92.0     Carboxyhemoglobin 1.9 (*) 0.5 - 1.5 %   Methemoglobin 0.9  0.0 - 1.5 %  BASIC METABOLIC PANEL     Status: Abnormal   Collection Time    07/11/14  4:35 PM      Result Value Ref Range   Sodium 136 (*) 137 - 147 mEq/L   Potassium 5.9 (*) 3.7 -  5.3 mEq/L   Chloride 95 (*) 96 - 112 mEq/L   CO2 21  19 - 32 mEq/L   Glucose, Bld 221 (*) 70 - 99 mg/dL   BUN 83 (*) 6 - 23 mg/dL   Creatinine, Ser 4.74 (*) 0.50 - 1.35 mg/dL   Calcium 7.7 (*) 8.4 - 10.5 mg/dL   GFR calc non Af Amer 13 (*) >90 mL/min   GFR calc Af Amer 15 (*) >90 mL/min   Comment: (NOTE)     The eGFR has been calculated using the CKD EPI equation.     This calculation has not been validated in all clinical situations.     eGFR's persistently <90 mL/min signify possible Chronic Kidney     Disease.   Anion gap 20 (*) 5 - 15  GLUCOSE, CAPILLARY     Status: Abnormal   Collection Time    07/11/14  4:36 PM      Result Value Ref Range   Glucose-Capillary 212 (*) 70 - 99 mg/dL   Comment 1 Arterial Sample    GLUCOSE, CAPILLARY  Status: Abnormal   Collection Time    07/11/14  7:32 PM      Result Value Ref Range   Glucose-Capillary 173 (*) 70 - 99 mg/dL   Comment 1 Capillary Sample    TROPONIN I     Status: Abnormal   Collection Time    07/11/14 10:40 PM      Result Value Ref Range   Troponin I 1.28 (*) <0.30 ng/mL   Comment:            Due to the release kinetics of cTnI,     a negative result within the first hours     of the onset of symptoms does not rule out     myocardial infarction with certainty.     If myocardial infarction is still suspected,     repeat the test at appropriate intervals.     CRITICAL VALUE NOTED.  VALUE IS CONSISTENT WITH PREVIOUSLY REPORTED AND CALLED VALUE.  BASIC METABOLIC PANEL     Status: Abnormal   Collection Time    07/11/14 10:40 PM      Result Value Ref Range   Sodium 139  137 - 147 mEq/L   Potassium 5.2  3.7 - 5.3 mEq/L   Chloride 98  96 - 112 mEq/L   CO2 22  19 - 32 mEq/L   Glucose, Bld 192 (*) 70 - 99 mg/dL   BUN 83 (*) 6 - 23 mg/dL   Creatinine, Ser 4.87 (*) 0.50 - 1.35 mg/dL   Calcium 7.7 (*) 8.4 - 10.5 mg/dL   GFR calc non Af Amer 12 (*) >90 mL/min   GFR calc Af Amer 14 (*) >90 mL/min   Comment: (NOTE)     The  eGFR has been calculated using the CKD EPI equation.     This calculation has not been validated in all clinical situations.     eGFR's persistently <90 mL/min signify possible Chronic Kidney     Disease.   Anion gap 19 (*) 5 - 15  GLUCOSE, CAPILLARY     Status: Abnormal   Collection Time    07/12/14 12:04 AM      Result Value Ref Range   Glucose-Capillary 162 (*) 70 - 99 mg/dL   Comment 1 Capillary Sample    BLOOD GAS, ARTERIAL     Status: Abnormal   Collection Time    07/12/14  3:19 AM      Result Value Ref Range   FIO2 0.65     Delivery systems VENTILATOR     Mode PRESSURE REGULATED VOLUME CONTROL     VT 460     Rate 20     Peep/cpap 5.0     pH, Arterial 7.264 (*) 7.350 - 7.450   pCO2 arterial 53.1 (*) 35.0 - 45.0 mmHg   pO2, Arterial 77.0 (*) 80.0 - 100.0 mmHg   Bicarbonate 23.3  20.0 - 24.0 mEq/L   TCO2 24.9  0 - 100 mmol/L   Acid-base deficit 2.7 (*) 0.0 - 2.0 mmol/L   O2 Saturation 93.6     Patient temperature 98.6     Collection site A-LINE     Drawn by [phone]     Sample type ARTERIAL DRAW     Allens test (pass/fail) PASS  PASS  TROPONIN I     Status: Abnormal   Collection Time    07/12/14  4:00 AM      Result Value Ref Range   Troponin I 1.14 (*) <0.30  ng/mL   Comment:            Due to the release kinetics of cTnI,     a negative result within the first hours     of the onset of symptoms does not rule out     myocardial infarction with certainty.     If myocardial infarction is still suspected,     repeat the test at appropriate intervals.     CRITICAL VALUE NOTED.  VALUE IS CONSISTENT WITH PREVIOUSLY REPORTED AND CALLED VALUE.  CBC     Status: Abnormal   Collection Time    07/12/14  4:00 AM      Result Value Ref Range   WBC 14.0 (*) 4.0 - 10.5 K/uL   RBC 4.37  4.22 - 5.81 MIL/uL   Hemoglobin 12.2 (*) 13.0 - 17.0 g/dL   HCT 38.0 (*) 39.0 - 52.0 %   MCV 87.0  78.0 - 100.0 fL   MCH 27.9  26.0 - 34.0 pg   MCHC 32.1  30.0 - 36.0 g/dL   RDW 17.4 (*) 11.5 -  15.5 %   Platelets 307  150 - 400 K/uL  COMPREHENSIVE METABOLIC PANEL     Status: Abnormal   Collection Time    07/12/14  4:00 AM      Result Value Ref Range   Sodium 139  137 - 147 mEq/L   Potassium 4.9  3.7 - 5.3 mEq/L   Chloride 98  96 - 112 mEq/L   CO2 23  19 - 32 mEq/L   Glucose, Bld 162 (*) 70 - 99 mg/dL   BUN 83 (*) 6 - 23 mg/dL   Creatinine, Ser 4.45 (*) 0.50 - 1.35 mg/dL   Calcium 7.4 (*) 8.4 - 10.5 mg/dL   Total Protein 5.9 (*) 6.0 - 8.3 g/dL   Albumin 3.0 (*) 3.5 - 5.2 g/dL   AST 20  0 - 37 U/L   ALT 34  0 - 53 U/L   Alkaline Phosphatase 89  39 - 117 U/L   Total Bilirubin 0.3  0.3 - 1.2 mg/dL   GFR calc non Af Amer 14 (*) >90 mL/min   GFR calc Af Amer 16 (*) >90 mL/min   Comment: (NOTE)     The eGFR has been calculated using the CKD EPI equation.     This calculation has not been validated in all clinical situations.     eGFR's persistently <90 mL/min signify possible Chronic Kidney     Disease.   Anion gap 18 (*) 5 - 15  PROCALCITONIN     Status: None   Collection Time    07/12/14  4:00 AM      Result Value Ref Range   Procalcitonin 2.53     Comment:            Interpretation:     PCT > 2 ng/mL:     Systemic infection (sepsis) is likely,     unless other causes are known.     (NOTE)             ICU PCT Algorithm               Non ICU PCT Algorithm        ----------------------------     ------------------------------             PCT < 0.25 ng/mL                 PCT <  0.1 ng/mL         Stopping of antibiotics            Stopping of antibiotics           strongly encouraged.               strongly encouraged.        ----------------------------     ------------------------------           PCT level decrease by               PCT < 0.25 ng/mL           >= 80% from peak PCT           OR PCT 0.25 - 0.5 ng/mL          Stopping of antibiotics                                                 encouraged.         Stopping of antibiotics               encouraged.         ----------------------------     ------------------------------           PCT level decrease by              PCT >= 0.25 ng/mL           < 80% from peak PCT            AND PCT >= 0.5 ng/mL            Continuing antibiotics                                                  encouraged.           Continuing antibiotics                encouraged.        ----------------------------     ------------------------------         PCT level increase compared          PCT > 0.5 ng/mL             with peak PCT AND              PCT >= 0.5 ng/mL             Escalation of antibiotics                                              strongly encouraged.          Escalation of antibiotics            strongly encouraged.  GLUCOSE, CAPILLARY     Status: Abnormal   Collection Time    07/12/14  4:15 AM      Result Value Ref Range   Glucose-Capillary 172 (*) 70 - 99 mg/dL  GLUCOSE, CAPILLARY     Status: Abnormal   Collection Time    07/12/14  7:45  AM      Result Value Ref Range   Glucose-Capillary 165 (*) 70 - 99 mg/dL   Comment 1 Capillary Sample    GLUCOSE, CAPILLARY     Status: Abnormal   Collection Time    07/12/14 11:26 AM      Result Value Ref Range   Glucose-Capillary 164 (*) 70 - 99 mg/dL   Comment 1 Capillary Sample    GLUCOSE, CAPILLARY     Status: Abnormal   Collection Time    07/12/14  3:17 PM      Result Value Ref Range   Glucose-Capillary 196 (*) 70 - 99 mg/dL   Comment 1 Capillary Sample    GLUCOSE, CAPILLARY     Status: Abnormal   Collection Time    07/12/14  8:40 PM      Result Value Ref Range   Glucose-Capillary 233 (*) 70 - 99 mg/dL  GLUCOSE, CAPILLARY     Status: Abnormal   Collection Time    07/12/14 11:40 PM      Result Value Ref Range   Glucose-Capillary 247 (*) 70 - 99 mg/dL   Comment 1 Capillary Sample    GLUCOSE, CAPILLARY     Status: Abnormal   Collection Time    07/12/14 11:46 PM      Result Value Ref Range   Glucose-Capillary 268 (*) 70 - 99 mg/dL   Comment 1  Venous Sample    GLUCOSE, CAPILLARY     Status: Abnormal   Collection Time    07/13/14  4:00 AM      Result Value Ref Range   Glucose-Capillary 260 (*) 70 - 99 mg/dL   Comment 1 Arterial Sample    BASIC METABOLIC PANEL     Status: Abnormal   Collection Time    07/13/14  4:35 AM      Result Value Ref Range   Sodium 142  137 - 147 mEq/L   Potassium 4.4  3.7 - 5.3 mEq/L   Chloride 101  96 - 112 mEq/L   CO2 25  19 - 32 mEq/L   Glucose, Bld 270 (*) 70 - 99 mg/dL   BUN 81 (*) 6 - 23 mg/dL   Creatinine, Ser 3.03 (*) 0.50 - 1.35 mg/dL   Calcium 7.6 (*) 8.4 - 10.5 mg/dL   GFR calc non Af Amer 22 (*) >90 mL/min   GFR calc Af Amer 25 (*) >90 mL/min   Comment: (NOTE)     The eGFR has been calculated using the CKD EPI equation.     This calculation has not been validated in all clinical situations.     eGFR's persistently <90 mL/min signify possible Chronic Kidney     Disease.   Anion gap 16 (*) 5 - 15  CBC     Status: Abnormal   Collection Time    07/13/14  4:35 AM      Result Value Ref Range   WBC 11.5 (*) 4.0 - 10.5 K/uL   RBC 4.26  4.22 - 5.81 MIL/uL   Hemoglobin 11.8 (*) 13.0 - 17.0 g/dL   HCT 37.2 (*) 39.0 - 52.0 %   MCV 87.3  78.0 - 100.0 fL   MCH 27.7  26.0 - 34.0 pg   MCHC 31.7  30.0 - 36.0 g/dL   RDW 17.5 (*) 11.5 - 15.5 %   Platelets 327  150 - 400 K/uL  MAGNESIUM     Status: Abnormal   Collection Time  07/13/14  4:35 AM      Result Value Ref Range   Magnesium 1.4 (*) 1.5 - 2.5 mg/dL  PHOSPHORUS     Status: None   Collection Time    07/13/14  4:35 AM      Result Value Ref Range   Phosphorus 4.3  2.3 - 4.6 mg/dL  PROCALCITONIN     Status: None   Collection Time    07/13/14  4:35 AM      Result Value Ref Range   Procalcitonin 1.56     Comment:            Interpretation:     PCT > 0.5 ng/mL and <= 2 ng/mL:     Systemic infection (sepsis) is possible,     but other conditions are known to elevate     PCT as well.     (NOTE)             ICU PCT Algorithm                Non ICU PCT Algorithm        ----------------------------     ------------------------------             PCT < 0.25 ng/mL                 PCT < 0.1 ng/mL         Stopping of antibiotics            Stopping of antibiotics           strongly encouraged.               strongly encouraged.        ----------------------------     ------------------------------           PCT level decrease by               PCT < 0.25 ng/mL           >= 80% from peak PCT           OR PCT 0.25 - 0.5 ng/mL          Stopping of antibiotics                                                 encouraged.         Stopping of antibiotics               encouraged.        ----------------------------     ------------------------------           PCT level decrease by              PCT >= 0.25 ng/mL           < 80% from peak PCT            AND PCT >= 0.5 ng/mL            Continuing antibiotics                                                  encouraged.           Continuing  antibiotics                encouraged.        ----------------------------     ------------------------------         PCT level increase compared          PCT > 0.5 ng/mL             with peak PCT AND              PCT >= 0.5 ng/mL             Escalation of antibiotics                                              strongly encouraged.          Escalation of antibiotics            strongly encouraged.  BLOOD GAS, ARTERIAL     Status: Abnormal   Collection Time    07/13/14  5:22 AM      Result Value Ref Range   FIO2 0.60     Delivery systems VENTILATOR     Mode PRESSURE REGULATED VOLUME CONTROL     VT 460     Rate 20     Peep/cpap 10.0     pH, Arterial 7.344 (*) 7.350 - 7.450   pCO2 arterial 47.2 (*) 35.0 - 45.0 mmHg   pO2, Arterial 88.6  80.0 - 100.0 mmHg   Bicarbonate 25.1 (*) 20.0 - 24.0 mEq/L   TCO2 26.5  0 - 100 mmol/L   Acid-Base Excess 0.1  0.0 - 2.0 mmol/L   O2 Saturation 96.3     Patient temperature 98.6     Collection site  ARTERIAL LINE     Drawn by [phone]     Sample type ARTERIAL DRAW     Allens test (pass/fail) PASS  PASS  GLUCOSE, CAPILLARY     Status: Abnormal   Collection Time    07/13/14  5:59 AM      Result Value Ref Range   Glucose-Capillary 259 (*) 70 - 99 mg/dL   Comment 1 Arterial Sample    GLUCOSE, CAPILLARY     Status: Abnormal   Collection Time    07/13/14  7:50 AM      Result Value Ref Range   Glucose-Capillary 251 (*) 70 - 99 mg/dL   Comment 1 Capillary Sample      Imaging: Imaging results have been reviewed  Assessment/Plan:   1. Principal Problem: 2.   Cardiac arrest 3. Active Problems: 4.   Acute on chronic kidney failure 5.   Hyperkalemia 6.   CHF (congestive heart failure) 7.   DM (diabetes mellitus) 8.   CAD (coronary artery disease) of artery bypass graft 9.   HTN (hypertension) 10.   COPD (chronic obstructive pulmonary disease) 11.   GERD (gastroesophageal reflux disease) 12.   Acute encephalopathy 13.   CAP (community acquired pneumonia) 43.   Time Spent Directly with Patient:  20 minutes  Length of Stay:  LOS: 2 days   Day # 3 witnessed arrest s/p resuscitation , VT/VF, with ROSC 16 min. He was hyperkalemic which was addressed with renal insuff (SCr 5----> 3). Renal US no obstruction. He is pressor dependent and was on Dop and Levo (Dop weaned). VDRF with PEEP 5, CVP 12.  On lasix drip with decreased UOP. EF 656% by 2D. He is on ATBX ? Sepsis. Major issues now are Vent weaning per PCCM as well as pressor weaning, He appears neurologically intact. No arrhythmias. Will continue to follow.   Lorretta Harp 07/13/2014, 9:16 AM

## 2014-07-14 ENCOUNTER — Inpatient Hospital Stay (HOSPITAL_COMMUNITY): Payer: Medicaid Other

## 2014-07-14 LAB — BASIC METABOLIC PANEL
Anion gap: 18 — ABNORMAL HIGH (ref 5–15)
BUN: 82 mg/dL — AB (ref 6–23)
CALCIUM: 8.4 mg/dL (ref 8.4–10.5)
CO2: 28 mEq/L (ref 19–32)
Chloride: 98 mEq/L (ref 96–112)
Creatinine, Ser: 1.86 mg/dL — ABNORMAL HIGH (ref 0.50–1.35)
GFR calc non Af Amer: 39 mL/min — ABNORMAL LOW (ref >90)
GFR, EST AFRICAN AMERICAN: 46 mL/min — AB (ref >90)
GLUCOSE: 138 mg/dL — AB (ref 70–99)
POTASSIUM: 3.8 meq/L (ref 3.7–5.3)
SODIUM: 144 meq/L (ref 137–147)

## 2014-07-14 LAB — GLUCOSE, CAPILLARY
GLUCOSE-CAPILLARY: 133 mg/dL — AB (ref 70–99)
GLUCOSE-CAPILLARY: 136 mg/dL — AB (ref 70–99)
GLUCOSE-CAPILLARY: 148 mg/dL — AB (ref 70–99)
GLUCOSE-CAPILLARY: 161 mg/dL — AB (ref 70–99)
GLUCOSE-CAPILLARY: 167 mg/dL — AB (ref 70–99)
GLUCOSE-CAPILLARY: 145 mg/dL — AB (ref 70–99)
GLUCOSE-CAPILLARY: 133 mg/dL — AB (ref 70–99)
GLUCOSE-CAPILLARY: 163 mg/dL — AB (ref 70–99)
Glucose-Capillary: 106 mg/dL — ABNORMAL HIGH (ref 70–99)
Glucose-Capillary: 128 mg/dL — ABNORMAL HIGH (ref 70–99)
Glucose-Capillary: 136 mg/dL — ABNORMAL HIGH (ref 70–99)
Glucose-Capillary: 144 mg/dL — ABNORMAL HIGH (ref 70–99)
Glucose-Capillary: 149 mg/dL — ABNORMAL HIGH (ref 70–99)
Glucose-Capillary: 151 mg/dL — ABNORMAL HIGH (ref 70–99)
Glucose-Capillary: 152 mg/dL — ABNORMAL HIGH (ref 70–99)
Glucose-Capillary: 162 mg/dL — ABNORMAL HIGH (ref 70–99)
Glucose-Capillary: 167 mg/dL — ABNORMAL HIGH (ref 70–99)
Glucose-Capillary: 172 mg/dL — ABNORMAL HIGH (ref 70–99)
Glucose-Capillary: 195 mg/dL — ABNORMAL HIGH (ref 70–99)
Glucose-Capillary: 196 mg/dL — ABNORMAL HIGH (ref 70–99)
Glucose-Capillary: 199 mg/dL — ABNORMAL HIGH (ref 70–99)
Glucose-Capillary: 206 mg/dL — ABNORMAL HIGH (ref 70–99)
Glucose-Capillary: 208 mg/dL — ABNORMAL HIGH (ref 70–99)
Glucose-Capillary: 250 mg/dL — ABNORMAL HIGH (ref 70–99)
Glucose-Capillary: 280 mg/dL — ABNORMAL HIGH (ref 70–99)

## 2014-07-14 LAB — BLOOD GAS, ARTERIAL
ACID-BASE EXCESS: 5.2 mmol/L — AB (ref 0.0–2.0)
BICARBONATE: 30 meq/L — AB (ref 20.0–24.0)
Drawn by: 419341
FIO2: 0.5 %
MECHVT: 460 mL
O2 SAT: 96.3 %
PCO2 ART: 51.1 mmHg — AB (ref 35.0–45.0)
PEEP: 5 cmH2O
PO2 ART: 83.7 mmHg (ref 80.0–100.0)
Patient temperature: 98.6
RATE: 20 resp/min
TCO2: 31.6 mmol/L (ref 0–100)
pH, Arterial: 7.386 (ref 7.350–7.450)

## 2014-07-14 LAB — POCT I-STAT 3, VENOUS BLOOD GAS (G3P V)
Acid-Base Excess: 7 mmol/L — ABNORMAL HIGH (ref 0.0–2.0)
BICARBONATE: 33.2 meq/L — AB (ref 20.0–24.0)
O2 Saturation: 92 %
PCO2 VEN: 52.7 mmHg — AB (ref 45.0–50.0)
PH VEN: 7.41 — AB (ref 7.250–7.300)
Patient temperature: 99.5
TCO2: 35 mmol/L (ref 0–100)
pO2, Ven: 67 mmHg — ABNORMAL HIGH (ref 30.0–45.0)

## 2014-07-14 LAB — MAGNESIUM: MAGNESIUM: 1.3 mg/dL — AB (ref 1.5–2.5)

## 2014-07-14 LAB — CBC
HCT: 38.3 % — ABNORMAL LOW (ref 39.0–52.0)
Hemoglobin: 12.2 g/dL — ABNORMAL LOW (ref 13.0–17.0)
MCH: 28.4 pg (ref 26.0–34.0)
MCHC: 31.9 g/dL (ref 30.0–36.0)
MCV: 89.1 fL (ref 78.0–100.0)
PLATELETS: 340 10*3/uL (ref 150–400)
RBC: 4.3 MIL/uL (ref 4.22–5.81)
RDW: 17.5 % — AB (ref 11.5–15.5)
WBC: 12.2 10*3/uL — ABNORMAL HIGH (ref 4.0–10.5)

## 2014-07-14 LAB — PHOSPHORUS: Phosphorus: 3.8 mg/dL (ref 2.3–4.6)

## 2014-07-14 LAB — PRO B NATRIURETIC PEPTIDE: Pro B Natriuretic peptide (BNP): 1548 pg/mL — ABNORMAL HIGH (ref 0–125)

## 2014-07-14 MED ORDER — FUROSEMIDE 10 MG/ML IJ SOLN
8.0000 mg/h | INTRAVENOUS | Status: DC
  Administered 2014-07-15: 8 mg/h via INTRAVENOUS
  Filled 2014-07-14: qty 25

## 2014-07-14 MED ORDER — METOLAZONE 5 MG PO TABS
5.0000 mg | ORAL_TABLET | Freq: Every day | ORAL | Status: AC
  Administered 2014-07-14: 5 mg via ORAL
  Filled 2014-07-14: qty 1

## 2014-07-14 MED ORDER — POTASSIUM CHLORIDE 20 MEQ/15ML (10%) PO LIQD
40.0000 meq | Freq: Three times a day (TID) | ORAL | Status: AC
  Administered 2014-07-14 (×2): 40 meq
  Filled 2014-07-14 (×2): qty 30

## 2014-07-14 NOTE — Progress Notes (Addendum)
Subjective:  Day # 4 witnessed cardiac arrest. ROSC 16 min after resuscitation. VT/VF in setting of hyperkalemia and renal insufficiency which has been coorected. VDRF. Alert and follows commands  Objective:  Temp:  [99.2 F (37.3 C)-99.9 F (37.7 C)] 99.5 F (37.5 C) (09/17 0715) Pulse Rate:  [76-103] 84 (09/17 0810) Resp:  [9-23] 20 (09/17 0810) BP: (77-164)/(32-63) 150/51 mmHg (09/17 0810) SpO2:  [91 %-98 %] 98 % (09/17 0810) Arterial Line BP: (85-181)/(36-111) 152/56 mmHg (09/17 0800) FiO2 (%):  [40 %-60 %] 40 % (09/17 0903) Weight:  [225 lb 12 oz (102.4 kg)] 225 lb 12 oz (102.4 kg) (09/17 0358) Weight change: -10.6 oz (-0.3 kg)  Intake/Output from previous day: 09/16 0701 - 09/17 0700 In: 2471.6 [I.V.:721.6; NG/GT:1150; IV Piggyback:600] Out: 3725 [Urine:3725]  Intake/Output from this shift: Total I/O In: 530.5 [I.V.:50.5; NG/GT:180; IV Piggyback:300] Out: 525 [Urine:525]  Physical Exam: General appearance: alert and no distress Neck: no adenopathy, no carotid bruit, no JVD, supple, symmetrical, trachea midline and thyroid not enlarged, symmetric, no tenderness/mass/nodules Lungs: clear to auscultation bilaterally Heart: regular rate and rhythm, S1, S2 normal, no murmur, click, rub or gallop Extremities: extremities normal, atraumatic, no cyanosis or edema  Lab Results: Results for orders placed during the hospital encounter of 07/11/14 (from the past 48 hour(s))  GLUCOSE, CAPILLARY     Status: Abnormal   Collection Time    07/12/14 11:26 AM      Result Value Ref Range   Glucose-Capillary 164 (*) 70 - 99 mg/dL   Comment 1 Capillary Sample    GLUCOSE, CAPILLARY     Status: Abnormal   Collection Time    07/12/14  3:17 PM      Result Value Ref Range   Glucose-Capillary 196 (*) 70 - 99 mg/dL   Comment 1 Capillary Sample    GLUCOSE, CAPILLARY     Status: Abnormal   Collection Time    07/12/14  8:40 PM      Result Value Ref Range   Glucose-Capillary 233 (*)  70 - 99 mg/dL  GLUCOSE, CAPILLARY     Status: Abnormal   Collection Time    07/12/14 11:40 PM      Result Value Ref Range   Glucose-Capillary 247 (*) 70 - 99 mg/dL   Comment 1 Capillary Sample    GLUCOSE, CAPILLARY     Status: Abnormal   Collection Time    07/12/14 11:46 PM      Result Value Ref Range   Glucose-Capillary 268 (*) 70 - 99 mg/dL   Comment 1 Venous Sample    GLUCOSE, CAPILLARY     Status: Abnormal   Collection Time    07/13/14  4:00 AM      Result Value Ref Range   Glucose-Capillary 260 (*) 70 - 99 mg/dL   Comment 1 Arterial Sample    BASIC METABOLIC PANEL     Status: Abnormal   Collection Time    07/13/14  4:35 AM      Result Value Ref Range   Sodium 142  137 - 147 mEq/L   Potassium 4.4  3.7 - 5.3 mEq/L   Chloride 101  96 - 112 mEq/L   CO2 25  19 - 32 mEq/L   Glucose, Bld 270 (*) 70 - 99 mg/dL   BUN 81 (*) 6 - 23 mg/dL   Creatinine, Ser 3.03 (*) 0.50 - 1.35 mg/dL   Calcium 7.6 (*) 8.4 - 10.5 mg/dL   GFR  calc non Af Amer 22 (*) >90 mL/min   GFR calc Af Amer 25 (*) >90 mL/min   Comment: (NOTE)     The eGFR has been calculated using the CKD EPI equation.     This calculation has not been validated in all clinical situations.     eGFR's persistently <90 mL/min signify possible Chronic Kidney     Disease.   Anion gap 16 (*) 5 - 15  CBC     Status: Abnormal   Collection Time    07/13/14  4:35 AM      Result Value Ref Range   WBC 11.5 (*) 4.0 - 10.5 K/uL   RBC 4.26  4.22 - 5.81 MIL/uL   Hemoglobin 11.8 (*) 13.0 - 17.0 g/dL   HCT 37.2 (*) 39.0 - 52.0 %   MCV 87.3  78.0 - 100.0 fL   MCH 27.7  26.0 - 34.0 pg   MCHC 31.7  30.0 - 36.0 g/dL   RDW 17.5 (*) 11.5 - 15.5 %   Platelets 327  150 - 400 K/uL  MAGNESIUM     Status: Abnormal   Collection Time    07/13/14  4:35 AM      Result Value Ref Range   Magnesium 1.4 (*) 1.5 - 2.5 mg/dL  PHOSPHORUS     Status: None   Collection Time    07/13/14  4:35 AM      Result Value Ref Range   Phosphorus 4.3  2.3 - 4.6  mg/dL  PROCALCITONIN     Status: None   Collection Time    07/13/14  4:35 AM      Result Value Ref Range   Procalcitonin 1.56     Comment:            Interpretation:     PCT > 0.5 ng/mL and <= 2 ng/mL:     Systemic infection (sepsis) is possible,     but other conditions are known to elevate     PCT as well.     (NOTE)             ICU PCT Algorithm               Non ICU PCT Algorithm        ----------------------------     ------------------------------             PCT < 0.25 ng/mL                 PCT < 0.1 ng/mL         Stopping of antibiotics            Stopping of antibiotics           strongly encouraged.               strongly encouraged.        ----------------------------     ------------------------------           PCT level decrease by               PCT < 0.25 ng/mL           >= 80% from peak PCT           OR PCT 0.25 - 0.5 ng/mL          Stopping of antibiotics  encouraged.         Stopping of antibiotics               encouraged.        ----------------------------     ------------------------------           PCT level decrease by              PCT >= 0.25 ng/mL           < 80% from peak PCT            AND PCT >= 0.5 ng/mL            Continuing antibiotics                                                  encouraged.           Continuing antibiotics                encouraged.        ----------------------------     ------------------------------         PCT level increase compared          PCT > 0.5 ng/mL             with peak PCT AND              PCT >= 0.5 ng/mL             Escalation of antibiotics                                              strongly encouraged.          Escalation of antibiotics            strongly encouraged.  BLOOD GAS, ARTERIAL     Status: Abnormal   Collection Time    07/13/14  5:22 AM      Result Value Ref Range   FIO2 0.60     Delivery systems VENTILATOR     Mode PRESSURE REGULATED VOLUME  CONTROL     VT 460     Rate 20     Peep/cpap 10.0     pH, Arterial 7.344 (*) 7.350 - 7.450   pCO2 arterial 47.2 (*) 35.0 - 45.0 mmHg   pO2, Arterial 88.6  80.0 - 100.0 mmHg   Bicarbonate 25.1 (*) 20.0 - 24.0 mEq/L   TCO2 26.5  0 - 100 mmol/L   Acid-Base Excess 0.1  0.0 - 2.0 mmol/L   O2 Saturation 96.3     Patient temperature 98.6     Collection site ARTERIAL LINE     Drawn by [phone]     Sample type ARTERIAL DRAW     Allens test (pass/fail) PASS  PASS  GLUCOSE, CAPILLARY     Status: Abnormal   Collection Time    07/13/14  5:59 AM      Result Value Ref Range   Glucose-Capillary 259 (*) 70 - 99 mg/dL   Comment 1 Arterial Sample    GLUCOSE, CAPILLARY     Status: Abnormal   Collection Time    07/13/14  7:50 AM      Result Value Ref Range  Glucose-Capillary 251 (*) 70 - 99 mg/dL   Comment 1 Capillary Sample    GLUCOSE, CAPILLARY     Status: Abnormal   Collection Time    07/13/14  7:59 AM      Result Value Ref Range   Glucose-Capillary 250 (*) 70 - 99 mg/dL  GLUCOSE, CAPILLARY     Status: Abnormal   Collection Time    07/13/14  9:02 AM      Result Value Ref Range   Glucose-Capillary 233 (*) 70 - 99 mg/dL   Comment 1 Arterial Sample    GLUCOSE, CAPILLARY     Status: Abnormal   Collection Time    07/13/14  9:50 AM      Result Value Ref Range   Glucose-Capillary 212 (*) 70 - 99 mg/dL   Comment 1 Arterial Sample    GLUCOSE, CAPILLARY     Status: Abnormal   Collection Time    07/13/14 10:50 AM      Result Value Ref Range   Glucose-Capillary 239 (*) 70 - 99 mg/dL   Comment 1 Arterial Sample    GLUCOSE, CAPILLARY     Status: Abnormal   Collection Time    07/13/14 12:05 PM      Result Value Ref Range   Glucose-Capillary 282 (*) 70 - 99 mg/dL  GLUCOSE, CAPILLARY     Status: Abnormal   Collection Time    07/13/14  1:01 PM      Result Value Ref Range   Glucose-Capillary 268 (*) 70 - 99 mg/dL   Comment 1 Arterial Sample    GLUCOSE, CAPILLARY     Status: Abnormal    Collection Time    07/13/14  2:05 PM      Result Value Ref Range   Glucose-Capillary 195 (*) 70 - 99 mg/dL   Comment 1 Arterial Sample    GLUCOSE, CAPILLARY     Status: Abnormal   Collection Time    07/13/14  2:59 PM      Result Value Ref Range   Glucose-Capillary 164 (*) 70 - 99 mg/dL   Comment 1 Arterial Sample    GLUCOSE, CAPILLARY     Status: Abnormal   Collection Time    07/13/14  4:05 PM      Result Value Ref Range   Glucose-Capillary 166 (*) 70 - 99 mg/dL   Comment 1 Arterial Sample    GLUCOSE, CAPILLARY     Status: Abnormal   Collection Time    07/13/14  5:08 PM      Result Value Ref Range   Glucose-Capillary 140 (*) 70 - 99 mg/dL   Comment 1 Arterial Sample    GLUCOSE, CAPILLARY     Status: Abnormal   Collection Time    07/13/14  5:49 PM      Result Value Ref Range   Glucose-Capillary 147 (*) 70 - 99 mg/dL   Comment 1 Arterial Sample    GLUCOSE, CAPILLARY     Status: Abnormal   Collection Time    07/13/14  6:48 PM      Result Value Ref Range   Glucose-Capillary 142 (*) 70 - 99 mg/dL   Comment 1 Arterial Sample    GLUCOSE, CAPILLARY     Status: Abnormal   Collection Time    07/13/14  7:53 PM      Result Value Ref Range   Glucose-Capillary 140 (*) 70 - 99 mg/dL  GLUCOSE, CAPILLARY     Status: Abnormal   Collection  Time    07/13/14  9:01 PM      Result Value Ref Range   Glucose-Capillary 137 (*) 70 - 99 mg/dL   Comment 1 Arterial Sample    GLUCOSE, CAPILLARY     Status: Abnormal   Collection Time    07/13/14  9:53 PM      Result Value Ref Range   Glucose-Capillary 169 (*) 70 - 99 mg/dL   Comment 1 Arterial Sample    GLUCOSE, CAPILLARY     Status: Abnormal   Collection Time    07/13/14 10:55 PM      Result Value Ref Range   Glucose-Capillary 190 (*) 70 - 99 mg/dL   Comment 1 Arterial Sample    GLUCOSE, CAPILLARY     Status: Abnormal   Collection Time    07/14/14 12:01 AM      Result Value Ref Range   Glucose-Capillary 199 (*) 70 - 99 mg/dL   Comment  1 Arterial Sample    GLUCOSE, CAPILLARY     Status: Abnormal   Collection Time    07/14/14  1:08 AM      Result Value Ref Range   Glucose-Capillary 195 (*) 70 - 99 mg/dL   Comment 1 Arterial Sample    GLUCOSE, CAPILLARY     Status: Abnormal   Collection Time    07/14/14  2:01 AM      Result Value Ref Range   Glucose-Capillary 162 (*) 70 - 99 mg/dL   Comment 1 Arterial Sample    GLUCOSE, CAPILLARY     Status: Abnormal   Collection Time    07/14/14  3:06 AM      Result Value Ref Range   Glucose-Capillary 128 (*) 70 - 99 mg/dL   Comment 1 Arterial Sample    BLOOD GAS, ARTERIAL     Status: Abnormal   Collection Time    07/14/14  3:54 AM      Result Value Ref Range   FIO2 0.50     Mode PRESSURE REGULATED VOLUME CONTROL     VT 460     Rate 20     Peep/cpap 5.0     pH, Arterial 7.386  7.350 - 7.450   pCO2 arterial 51.1 (*) 35.0 - 45.0 mmHg   pO2, Arterial 83.7  80.0 - 100.0 mmHg   Bicarbonate 30.0 (*) 20.0 - 24.0 mEq/L   TCO2 31.6  0 - 100 mmol/L   Acid-Base Excess 5.2 (*) 0.0 - 2.0 mmol/L   O2 Saturation 96.3     Patient temperature 98.6     Collection site A-LINE     Drawn by [id number]     Sample type ARTERIAL DRAW     Allens test (pass/fail) PASS  PASS  GLUCOSE, CAPILLARY     Status: Abnormal   Collection Time    07/14/14  4:05 AM      Result Value Ref Range   Glucose-Capillary 133 (*) 70 - 99 mg/dL   Comment 1 Arterial Sample    BASIC METABOLIC PANEL     Status: Abnormal   Collection Time    07/14/14  4:40 AM      Result Value Ref Range   Sodium 144  137 - 147 mEq/L   Potassium 3.8  3.7 - 5.3 mEq/L   Chloride 98  96 - 112 mEq/L   CO2 28  19 - 32 mEq/L   Glucose, Bld 138 (*) 70 - 99 mg/dL   BUN 82 (*)  6 - 23 mg/dL   Creatinine, Ser 1.86 (*) 0.50 - 1.35 mg/dL   Comment: RESULT REPEATED AND VERIFIED   Calcium 8.4  8.4 - 10.5 mg/dL   GFR calc non Af Amer 39 (*) >90 mL/min   GFR calc Af Amer 46 (*) >90 mL/min   Comment: (NOTE)     The eGFR has been calculated using  the CKD EPI equation.     This calculation has not been validated in all clinical situations.     eGFR's persistently <90 mL/min signify possible Chronic Kidney     Disease.   Anion gap 18 (*) 5 - 15  CBC     Status: Abnormal   Collection Time    07/14/14  4:40 AM      Result Value Ref Range   WBC 12.2 (*) 4.0 - 10.5 K/uL   RBC 4.30  4.22 - 5.81 MIL/uL   Hemoglobin 12.2 (*) 13.0 - 17.0 g/dL   HCT 38.3 (*) 39.0 - 52.0 %   MCV 89.1  78.0 - 100.0 fL   MCH 28.4  26.0 - 34.0 pg   MCHC 31.9  30.0 - 36.0 g/dL   RDW 17.5 (*) 11.5 - 15.5 %   Platelets 340  150 - 400 K/uL  MAGNESIUM     Status: Abnormal   Collection Time    07/14/14  4:40 AM      Result Value Ref Range   Magnesium 1.3 (*) 1.5 - 2.5 mg/dL  PHOSPHORUS     Status: None   Collection Time    07/14/14  4:40 AM      Result Value Ref Range   Phosphorus 3.8  2.3 - 4.6 mg/dL  GLUCOSE, CAPILLARY     Status: Abnormal   Collection Time    07/14/14  5:20 AM      Result Value Ref Range   Glucose-Capillary 136 (*) 70 - 99 mg/dL   Comment 1 Arterial Sample    GLUCOSE, CAPILLARY     Status: Abnormal   Collection Time    07/14/14  6:08 AM      Result Value Ref Range   Glucose-Capillary 149 (*) 70 - 99 mg/dL   Comment 1 Arterial Sample    GLUCOSE, CAPILLARY     Status: Abnormal   Collection Time    07/14/14  7:00 AM      Result Value Ref Range   Glucose-Capillary 144 (*) 70 - 99 mg/dL   Comment 1 Arterial Sample      Imaging: Imaging results have been reviewed  Assessment/Plan:   1. Principal Problem: 2.   Cardiac arrest 3. Active Problems: 4.   Acute on chronic kidney failure 5.   Hyperkalemia 6.   CHF (congestive heart failure) 7.   DM (diabetes mellitus) 8.   CAD (coronary artery disease) of artery bypass graft 9.   HTN (hypertension) 10.   COPD (chronic obstructive pulmonary disease) 11.   GERD (gastroesophageal reflux disease) 12.   Acute encephalopathy 13.   CAP (community acquired pneumonia) 22.   Time  Spent Directly with Patient:  20 minutes  Length of Stay:  LOS: 3 days   Day #4 witnessed cardiac arrest, CPR, ROSC 16 min, VT/VF in setting of hyperkalemia and RI which have resolved. Nl EF by 2D. No acute EKG changes. NSR. CVP 8. On Levophed at 8 ug, fentanyl, insulin and lasix drip. Good diuresis. Good sats. PEEP 5. Hopefully can be weaned from Vent (per PCCM). I  don't see anything here that is irreversible. Once extubated and off drips will need an ischemia w/u   Lorretta Harp 07/14/2014, 9:20 AM

## 2014-07-14 NOTE — Progress Notes (Signed)
PULMONARY / CRITICAL CARE MEDICINE   Name: Richard [name] MRN: [id number] DOB: 1961-07-07    ADMISSION DATE:  07/11/2014 CONSULTATION DATE: 9/14  REFERRING MD :  Benjamine Mola  CHIEF COMPLAINT:  Cardiac arrest.   INITIAL PRESENTATION:  53 year old male transferred to [facility] from [place] on 9/14 w/ working dx of acute on chronic renal failure, hyperkalemia, encephalopathy and possible CAP. He was in ECHO when suddenly developed VF/VT arrest. ROSC estimated at 10-15 minutes. PCCM asked to assist w/ care s/p arrest.   STUDIES:  9/14 ECHO>>> 9/14 renal US>>>  SIGNIFICANT EVENTS: 9/14: admitted w/ acute on chronic renal failure, hyperkalemia and encephalopathy  9/14: cardiac arrest (VF/VT) while in echo  SUBJECTIVE:  Alert and interactive, following commands.  VITAL SIGNS: Temp:  [99.2 F (37.3 C)-99.9 F (37.7 C)] 99.5 F (37.5 C) (09/17 0715) Pulse Rate:  [76-103] 84 (09/17 0810) Resp:  [9-23] 20 (09/17 0810) BP: (77-164)/(32-63) 150/51 mmHg (09/17 0810) SpO2:  [91 %-98 %] 98 % (09/17 0810) Arterial Line BP: (85-181)/(36-111) 152/56 mmHg (09/17 0800) FiO2 (%):  [40 %-60 %] 40 % (09/17 0903) Weight:  [102.4 kg (225 lb 12 oz)] 102.4 kg (225 lb 12 oz) (09/17 0358)  HEMODYNAMICS: CVP:  [8 mmHg-11 mmHg] 8 mmHg  VENTILATOR SETTINGS: Vent Mode:  [-] PSV;CPAP FiO2 (%):  [40 %-60 %] 40 % Set Rate:  [20 bmp] 20 bmp Vt Set:  [460 mL] 460 mL PEEP:  [5 cmH20] 5 cmH20 Pressure Support:  [8 cmH20] 8 cmH20 Plateau Pressure:  [11 cmH20-28 cmH20] 16 cmH20  INTAKE / OUTPUT:  Intake/Output Summary (Last 24 hours) at 07/14/14 4098 Last data filed at 07/14/14 0900  Gross per 24 hour  Intake 2520.09 ml  Output   4125 ml  Net -1604.91 ml   PHYSICAL EXAMINATION: General: Obese 53 year old male, on vent s/p arrest, much more awake and interactive this AM, following commands. Neuro: Awake and interactive, following commands this AM. HEENT: Orally intubated. No clear JVD Cardiovascular:  Distant, RRR, Nl S1/S2, -M/R/G. Lungs: Coarse BS diffusely. Abdomen: Soft, non-tender. + bowel sounds obese Musculoskeletal:  Intact  Skin:  LE edema improved   LABS:  CBC  Recent Labs Lab 07/12/14 0400 07/13/14 0435 07/14/14 0440  WBC 14.0* 11.5* 12.2*  HGB 12.2* 11.8* 12.2*  HCT 38.0* 37.2* 38.3*  PLT 307 327 340     Coag's  Recent Labs Lab 07/11/14 0640  APTT 38*  INR 1.10   BMET  Recent Labs Lab 07/12/14 0400 07/13/14 0435 07/14/14 0440  NA 139 142 144  K 4.9 4.4 3.8  CL 98 101 98  CO2 BUN 83* 81* 82*  CREATININE 4.45* 3.03* 1.86*  GLUCOSE 162* 270* 138*   Electrolytes  Recent Labs Lab 07/12/14 0400 07/13/14 0435 07/14/14 0440  CALCIUM 7.4* 7.6* 8.4  MG  --  1.4* 1.3*  PHOS  --  4.3 3.8   Sepsis Markers  Recent Labs Lab 07/11/14 0915 07/11/14 1200 07/12/14 0400 07/13/14 0435  LATICACIDVEN 3.1*  --   --   --   PROCALCITON  --  0.42 2.53 1.56   ABG   Recent Labs Lab 07/12/14 0319 07/13/14 0522 07/14/14 0354  PHART 7.264* 7.344* 7.386  PCO2ART 53.1* 47.2* 51.1*  PO2ART 77.0* 88.6 83.7   Liver Enzymes  Recent Labs Lab 07/11/14 0915 07/12/14 0400  AST 24 20  ALT 39 34  ALKPHOS 113 89  BILITOT 0.3 0.3  ALBUMIN 3.4* 3.0*  Cardiac Enzymes  Recent Labs Lab 07/11/14 0915 07/11/14 2240 07/12/14 0400  TROPONINI 0.43* 1.28* 1.14*   Glucose  Recent Labs Lab 07/14/14 0201 07/14/14 0306 07/14/14 0405 07/14/14 0520 07/14/14 0608 07/14/14 0700  GLUCAP 162* 128* 133* 136* 149* 144*   Imaging Dg Chest Port 1 View  07/13/2014   CLINICAL DATA:  Endotracheal tube, shortness of breath  EXAM: PORTABLE CHEST - 1 VIEW  COMPARISON:  07/12/2014; 07/11/2014; 07/10/2014  FINDINGS: Grossly unchanged enlarged cardiac silhouette and mediastinal contours given persistently reduced lung volumes. Stable position of support apparatus. No pneumothorax. The pulmonary vasculature remains indistinct with cephalization of flow.  Unchanged small bilateral effusions. Bibasilar heterogeneous opacities, left greater than right, grossly unchanged minimally worsened in the interval. No new focal airspace opacities. Unchanged bones.  IMPRESSION: 1.  Stable positioning of support apparatus.  No pneumothorax. 2. Similar findings of pulmonary edema, small bilateral effusions and bibasilar opacities, left greater than right, atelectasis versus infiltrate.   Electronically Signed   By: Simonne Come M.D.   On: 07/13/2014 07:35     ASSESSMENT / PLAN:  PULMONARY OETT 9/14>>> A: acute respiratory failure s/p cardiopulm arrest.      Pulmonary infiltrates R>L  > favor pulm edema but with leukocytosis  P:   Begin PS trials, likely to extubate in AM if all goes well. Decrease PEEP to 5. F/u PCXR and ABG in AM No hypothermia protocol   CARDIOVASCULAR CVL A:  VF/VT arrest. 10-15 minutes to ROSC  Circulatory shock: prelim echo OK... ? Sepsis vs cardiogenic  Pulmonary edema  P:  Vasopressors for MAP goal >65, off dopamine, levophed titrate down as able  D/ced levaquin given concern for qtc changes Dc antihypertensives Increase diureses as below  RENAL A:   Acute on chronic renal failure (baseline scr 1.5-2.0) Hyperkalemia > improving  P:   BMET in AM D/Ced bicarb gtt Increase lasix drip to 8 mg/hr IV drip.  GASTROINTESTINAL A:   Obesity  P:   TF per nutrition PPI   HEMATOLOGIC A:   No acute issues   P:  Trend CBC and coags  [place] heparin   INFECTIOUS A:  Severe CAP? P:   BCx2 9/14>>> Sputum 9/14>>> Abx: levaquin, start date 9/14>>>9/14 ABX: doxy 9/14>>> ABX rocephin 9/14>>>  ENDOCRINE A:   DM P:   SSI protocol  CBGs  NEUROLOGIC A:   Acute encephalopathy s/p cardiac arrest > improving P:   RASS goal: 0 at this point Supportive care  Hold arctic sun protocol given improving mental status EEG will be deferred for now as patient is much more alert today and following commands  TODAY'S SUMMARY:   Increase lasix to 8 mg/hr, single dose of zaroxolyn today, replace K, begin PS trials and anticipate extubation in AM, family meeting today for informational purposes at this point.  I have personally obtained a history, examined the patient, evaluated laboratory and imaging results, formulated the assessment and plan and placed orders.  CRITICAL CARE: The patient is critically ill with multiple organ systems failure and requires high complexity decision making for assessment and support, frequent evaluation and titration of therapies, application of advanced monitoring technologies and extensive interpretation of multiple databases. Critical Care Time devoted to patient care services described in this note is 35 minutes.   Alyson Reedy, M.D. [facility] Pulmonary/Critical Care Medicine. Pager: [phone]. After hours pager: [phone].  07/14/2014, 9:28 AM

## 2014-07-14 NOTE — Progress Notes (Signed)
Weaned fio2 to 40% in effort to wean oxygen.  Sat 96-100%

## 2014-07-14 NOTE — Progress Notes (Signed)
CARE MANAGEMENT NOTE 07/14/2014  Patient:  Richard [name], Richard [name]   Account Number:  [id number]  Date Initiated:  07/11/2014  Documentation initiated by:  Emmett Arntz  Subjective/Objective Assessment:   pt transferred to [facility] from [facility].  Major cardiac event in ehco lab/Vf/Vt required intubation.     Action/Plan:   to be determined will follow for any cm or dc needs   Anticipated DC Date:  07/17/2014   Anticipated DC Plan:  HOME/SELF CARE  In-house referral  NA      DC Planning Services  NA      [facility] Choice  NA   Choice offered to / List presented to:  NA   DME arranged  NA      DME agency  NA     HH arranged  NA      HH agency  NA   Status of service:  In process, will continue to follow Medicare Important Message given?  NA - LOS <3 / Initial given by admissions (If response is "NO", the following Medicare IM given date fields will be blank) Date Medicare IM given:   Medicare IM given by:   Date Additional Medicare IM given:   Additional Medicare IM given by:    Discharge Disposition:    Per UR Regulation:  Reviewed for med. necessity/level of care/duration of stay  If discussed at Long Length of Stay Meetings, dates discussed:    Comments:  [id number]/Orbin Mayeux,RN,BSN,CCM: remains on vent, arterial lines intact, does not follow commands, Fi02 lowered from 60% to 40% on [id number] in attempt to wean/continues to require, pressure support. Bun-82, Creat-1.86/ Anion Gap-18/iv Fentanyl/iv insulin/ivlasix/iv levophed/plan from outcome unsure at time of review. Patient has temp 99.9, wbc-12.2/will follow to see if patient can be weaned from vent and next steps snf versus home with [facility].  [id number] [name],RN,BSN,CCM

## 2014-07-15 ENCOUNTER — Inpatient Hospital Stay (HOSPITAL_COMMUNITY): Payer: Medicaid Other

## 2014-07-15 ENCOUNTER — Encounter (HOSPITAL_COMMUNITY): Payer: Self-pay | Admitting: General Practice

## 2014-07-15 DIAGNOSIS — J438 Other emphysema: Secondary | ICD-10-CM

## 2014-07-15 LAB — GLUCOSE, CAPILLARY
GLUCOSE-CAPILLARY: 193 mg/dL — AB (ref 70–99)
GLUCOSE-CAPILLARY: 127 mg/dL — AB (ref 70–99)
GLUCOSE-CAPILLARY: 134 mg/dL — AB (ref 70–99)
GLUCOSE-CAPILLARY: 120 mg/dL — AB (ref 70–99)
GLUCOSE-CAPILLARY: 164 mg/dL — AB (ref 70–99)
GLUCOSE-CAPILLARY: 148 mg/dL — AB (ref 70–99)
GLUCOSE-CAPILLARY: 121 mg/dL — AB (ref 70–99)
GLUCOSE-CAPILLARY: 153 mg/dL — AB (ref 70–99)
GLUCOSE-CAPILLARY: 168 mg/dL — AB (ref 70–99)
Glucose-Capillary: 129 mg/dL — ABNORMAL HIGH (ref 70–99)
Glucose-Capillary: 129 mg/dL — ABNORMAL HIGH (ref 70–99)
Glucose-Capillary: 144 mg/dL — ABNORMAL HIGH (ref 70–99)
Glucose-Capillary: 148 mg/dL — ABNORMAL HIGH (ref 70–99)
Glucose-Capillary: 149 mg/dL — ABNORMAL HIGH (ref 70–99)
Glucose-Capillary: 150 mg/dL — ABNORMAL HIGH (ref 70–99)
Glucose-Capillary: 158 mg/dL — ABNORMAL HIGH (ref 70–99)
Glucose-Capillary: 159 mg/dL — ABNORMAL HIGH (ref 70–99)
Glucose-Capillary: 160 mg/dL — ABNORMAL HIGH (ref 70–99)
Glucose-Capillary: 164 mg/dL — ABNORMAL HIGH (ref 70–99)
Glucose-Capillary: 164 mg/dL — ABNORMAL HIGH (ref 70–99)
Glucose-Capillary: 166 mg/dL — ABNORMAL HIGH (ref 70–99)
Glucose-Capillary: 206 mg/dL — ABNORMAL HIGH (ref 70–99)

## 2014-07-15 LAB — POCT I-STAT 3, ART BLOOD GAS (G3+)
Acid-Base Excess: 13 mmol/L — ABNORMAL HIGH (ref 0.0–2.0)
Bicarbonate: 41.7 meq/L — ABNORMAL HIGH (ref 20.0–24.0)
O2 Saturation: 96 %
Patient temperature: 99.9
TCO2: 44 mmol/L (ref 0–100)
pCO2 arterial: 77.1 mmHg (ref 35.0–45.0)
pH, Arterial: 7.344 — ABNORMAL LOW (ref 7.350–7.450)
pO2, Arterial: 99 mmHg (ref 80.0–100.0)

## 2014-07-15 LAB — MAGNESIUM
MAGNESIUM: 2 mg/dL (ref 1.5–2.5)
Magnesium: 1.3 mg/dL — ABNORMAL LOW (ref 1.5–2.5)

## 2014-07-15 LAB — BASIC METABOLIC PANEL
ANION GAP: 15 (ref 5–15)
BUN: 78 mg/dL — AB (ref 6–23)
CO2: 36 meq/L — AB (ref 19–32)
CREATININE: 1.37 mg/dL — AB (ref 0.50–1.35)
Calcium: 9.1 mg/dL (ref 8.4–10.5)
Chloride: 97 mEq/L (ref 96–112)
GFR calc Af Amer: 66 mL/min — ABNORMAL LOW (ref >90)
GFR calc non Af Amer: 57 mL/min — ABNORMAL LOW (ref >90)
Glucose, Bld: 131 mg/dL — ABNORMAL HIGH (ref 70–99)
POTASSIUM: 4.1 meq/L (ref 3.7–5.3)
Sodium: 148 mEq/L — ABNORMAL HIGH (ref 137–147)

## 2014-07-15 LAB — CBC
HEMATOCRIT: 38.8 % — AB (ref 39.0–52.0)
HEMOGLOBIN: 12.4 g/dL — AB (ref 13.0–17.0)
MCH: 28.2 pg (ref 26.0–34.0)
MCHC: 32 g/dL (ref 30.0–36.0)
MCV: 88.4 fL (ref 78.0–100.0)
Platelets: 300 10*3/uL (ref 150–400)
RBC: 4.39 MIL/uL (ref 4.22–5.81)
RDW: 17.2 % — ABNORMAL HIGH (ref 11.5–15.5)
WBC: 10.2 10*3/uL (ref 4.0–10.5)

## 2014-07-15 LAB — BLOOD GAS, ARTERIAL
ACID-BASE EXCESS: 11.6 mmol/L — AB (ref 0.0–2.0)
BICARBONATE: 36.4 meq/L — AB (ref 20.0–24.0)
Drawn by: 405301
FIO2: 0.4 %
MECHVT: 460 mL
O2 Saturation: 95.2 %
PATIENT TEMPERATURE: 99.2
PEEP/CPAP: 5 cmH2O
RATE: 20 resp/min
TCO2: 38 mmol/L (ref 0–100)
pCO2 arterial: 55 mmHg — ABNORMAL HIGH (ref 35.0–45.0)
pH, Arterial: 7.437 (ref 7.350–7.450)
pO2, Arterial: 77.3 mmHg — ABNORMAL LOW (ref 80.0–100.0)

## 2014-07-15 LAB — PHOSPHORUS: Phosphorus: 3.7 mg/dL (ref 2.3–4.6)

## 2014-07-15 MED ORDER — ALBUTEROL SULFATE (2.5 MG/3ML) 0.083% IN NEBU
INHALATION_SOLUTION | RESPIRATORY_TRACT | Status: AC
  Administered 2014-07-15: 2.5 mg
  Filled 2014-07-15: qty 3

## 2014-07-15 MED ORDER — MIDAZOLAM HCL 2 MG/2ML IJ SOLN
1.0000 mg | INTRAMUSCULAR | Status: DC | PRN
  Administered 2014-07-16 – 2014-07-20 (×7): 2 mg via INTRAVENOUS
  Filled 2014-07-15 (×7): qty 2

## 2014-07-15 MED ORDER — METHYLPREDNISOLONE SODIUM SUCC 125 MG IJ SOLR
60.0000 mg | Freq: Every day | INTRAMUSCULAR | Status: DC
  Administered 2014-07-15: 60 mg via INTRAVENOUS
  Filled 2014-07-15: qty 0.96
  Filled 2014-07-15: qty 2

## 2014-07-15 MED ORDER — ETOMIDATE 2 MG/ML IV SOLN
INTRAVENOUS | Status: AC
  Administered 2014-07-15: 10 mg
  Filled 2014-07-15: qty 20

## 2014-07-15 MED ORDER — METOPROLOL TARTRATE 1 MG/ML IV SOLN
INTRAVENOUS | Status: AC
  Filled 2014-07-15: qty 5

## 2014-07-15 MED ORDER — LORAZEPAM 2 MG/ML IJ SOLN: 2.0000 mg | INTRAMUSCULAR | Status: DC | PRN

## 2014-07-15 MED ORDER — SODIUM CHLORIDE 0.9 % IV SOLN
25.0000 ug/h | INTRAVENOUS | Status: DC
  Administered 2014-07-15: 50 ug/h via INTRAVENOUS

## 2014-07-15 MED ORDER — LIDOCAINE HCL (CARDIAC) 20 MG/ML IV SOLN
INTRAVENOUS | Status: AC
  Filled 2014-07-15: qty 5

## 2014-07-15 MED ORDER — FENTANYL CITRATE 0.05 MG/ML IJ SOLN
INTRAMUSCULAR | Status: AC
  Administered 2014-07-15: 100 ug
  Filled 2014-07-15: qty 2

## 2014-07-15 MED ORDER — RACEPINEPHRINE HCL 2.25 % IN NEBU
INHALATION_SOLUTION | RESPIRATORY_TRACT | Status: AC
  Administered 2014-07-15: 0.5 mL
  Filled 2014-07-15: qty 0.5

## 2014-07-15 MED ORDER — ATORVASTATIN CALCIUM 80 MG PO TABS
80.0000 mg | ORAL_TABLET | Freq: Every day | ORAL | Status: DC
  Administered 2014-07-15 – 2014-07-26 (×11): 80 mg
  Filled 2014-07-15 (×12): qty 1

## 2014-07-15 MED ORDER — MAGNESIUM SULFATE 4000MG/100ML IJ SOLN
4.0000 g | Freq: Once | INTRAMUSCULAR | Status: AC
  Administered 2014-07-15: 4 g via INTRAVENOUS
  Filled 2014-07-15: qty 100

## 2014-07-15 MED ORDER — LORAZEPAM 2 MG/ML IJ SOLN
INTRAMUSCULAR | Status: AC
  Administered 2014-07-15: 2 mg via INTRAVENOUS
  Filled 2014-07-15: qty 1

## 2014-07-15 MED ORDER — MIDAZOLAM HCL 2 MG/2ML IJ SOLN
INTRAMUSCULAR | Status: AC
  Administered 2014-07-15: 2 mg
  Filled 2014-07-15: qty 2

## 2014-07-15 MED ORDER — METOPROLOL TARTRATE 1 MG/ML IV SOLN
5.0000 mg | Freq: Once | INTRAVENOUS | Status: AC
  Administered 2014-07-15: 5 mg via INTRAVENOUS

## 2014-07-15 MED ORDER — SUCCINYLCHOLINE CHLORIDE 20 MG/ML IJ SOLN
INTRAMUSCULAR | Status: AC
  Filled 2014-07-15: qty 1

## 2014-07-15 MED ORDER — FUROSEMIDE 10 MG/ML IJ SOLN
40.0000 mg | Freq: Once | INTRAMUSCULAR | Status: AC
Start: 2014-07-15 — End: 2014-07-15
  Administered 2014-07-15: 40 mg via INTRAVENOUS
  Filled 2014-07-15: qty 4

## 2014-07-15 MED ORDER — ROCURONIUM BROMIDE 50 MG/5ML IV SOLN
INTRAVENOUS | Status: AC
  Administered 2014-07-15: 10 mg
  Filled 2014-07-15: qty 2

## 2014-07-15 MED ORDER — LORAZEPAM 2 MG/ML IJ SOLN: 2.0000 mg | INTRAMUSCULAR | Status: DC

## 2014-07-15 MED ORDER — SODIUM CHLORIDE 0.9 % IV SOLN
25.0000 ug/h | INTRAVENOUS | Status: DC
  Administered 2014-07-15: 25 ug/h via INTRAVENOUS
  Administered 2014-07-16: 100 ug/h via INTRAVENOUS
  Filled 2014-07-15 (×3): qty 50

## 2014-07-15 NOTE — Progress Notes (Signed)
Dr. Molli Knock notified of abg results, no new orders given. Vent settings remain unchanged.

## 2014-07-15 NOTE — Procedures (Signed)
Intubation Procedure Note Richard [name] [id number] [birth year]  Procedure: Intubation Indications: Respiratory insufficiency  Procedure Details Consent: Risks of procedure as well as the alternatives and risks of each were explained to the (patient/caregiver).  Consent for procedure obtained. Time Out: Verified patient identification, verified procedure, site/side was marked, verified correct patient position, special equipment/implants available, medications/allergies/relevent history reviewed, required imaging and test results available.  Performed  Maximum sterile technique was used including antiseptics, gloves, hand hygiene and mask.  MAC    Evaluation Hemodynamic Status: BP stable throughout; O2 sats: stable throughout Patient's Current Condition: stable Complications: No apparent complications Patient did tolerate procedure well. Chest X-ray ordered to verify placement.  CXR: pending.   Richard [name] 07/15/2014

## 2014-07-15 NOTE — Progress Notes (Signed)
Extubated, developed strider and reintubated.  Family does not wish for trach/peg, ok to support for a few days then one way extubation.  Alyson Reedy, M.D. [facility] Pulmonary/Critical Care Medicine. Pager: [phone]. After hours pager: [phone].

## 2014-07-15 NOTE — Progress Notes (Signed)
Subjective:  Intubated, sedated, alert and communicative, VSS  Objective:  Temp:  [99.2 F (37.3 C)-100.1 F (37.8 C)] 99.9 F (37.7 C) (09/18 0735) Pulse Rate:  [92-110] 95 (09/18 0900) Resp:  [0-26] 20 (09/18 0900) BP: (86-167)/(4-94) 119/51 mmHg (09/18 0900) SpO2:  [87 %-98 %] 94 % (09/18 0900) Arterial Line BP: (109-186)/(44-75) 147/52 mmHg (09/18 0900) FiO2 (%):  [40 %] 40 % (09/18 0806) Weight:  [215 lb 9.8 oz (97.8 kg)] 215 lb 9.8 oz (97.8 kg) (09/18 0442) Weight change: -10 lb 2.3 oz (-4.6 kg)  Intake/Output from previous day: 09/17 0701 - 09/18 0700 In: 2466.9 [I.V.:566.9; NG/GT:1350; IV Piggyback:550] Out: 8676 [Urine:6375]  Intake/Output from this shift: Total I/O In: -  Out: 525 [Urine:525]  Physical Exam: General appearance: alert and no distress Neck: no adenopathy, no carotid bruit, no JVD, supple, symmetrical, trachea midline and thyroid not enlarged, symmetric, no tenderness/mass/nodules Lungs: clear to auscultation bilaterally Heart: regular rate and rhythm, S1, S2 normal, no murmur, click, rub or gallop Extremities: extremities normal, atraumatic, no cyanosis or edema  Lab Results: Results for orders placed during the hospital encounter of 07/11/14 (from the past 48 hour(s))  GLUCOSE, CAPILLARY     Status: Abnormal   Collection Time    07/13/14  9:50 AM      Result Value Ref Range   Glucose-Capillary 212 (*) 70 - 99 mg/dL   Comment 1 Arterial Sample    GLUCOSE, CAPILLARY     Status: Abnormal   Collection Time    07/13/14 10:50 AM      Result Value Ref Range   Glucose-Capillary 239 (*) 70 - 99 mg/dL   Comment 1 Arterial Sample    GLUCOSE, CAPILLARY     Status: Abnormal   Collection Time    07/13/14 12:05 PM      Result Value Ref Range   Glucose-Capillary 282 (*) 70 - 99 mg/dL  GLUCOSE, CAPILLARY     Status: Abnormal   Collection Time    07/13/14  1:01 PM      Result Value Ref Range   Glucose-Capillary 268 (*) 70 - 99 mg/dL   Comment  1 Arterial Sample    GLUCOSE, CAPILLARY     Status: Abnormal   Collection Time    07/13/14  2:05 PM      Result Value Ref Range   Glucose-Capillary 195 (*) 70 - 99 mg/dL   Comment 1 Arterial Sample    GLUCOSE, CAPILLARY     Status: Abnormal   Collection Time    07/13/14  2:59 PM      Result Value Ref Range   Glucose-Capillary 164 (*) 70 - 99 mg/dL   Comment 1 Arterial Sample    GLUCOSE, CAPILLARY     Status: Abnormal   Collection Time    07/13/14  4:05 PM      Result Value Ref Range   Glucose-Capillary 166 (*) 70 - 99 mg/dL   Comment 1 Arterial Sample    GLUCOSE, CAPILLARY     Status: Abnormal   Collection Time    07/13/14  5:08 PM      Result Value Ref Range   Glucose-Capillary 140 (*) 70 - 99 mg/dL   Comment 1 Arterial Sample    GLUCOSE, CAPILLARY     Status: Abnormal   Collection Time    07/13/14  5:49 PM      Result Value Ref Range   Glucose-Capillary 147 (*) 70 - 99 mg/dL  Comment 1 Arterial Sample    GLUCOSE, CAPILLARY     Status: Abnormal   Collection Time    07/13/14  6:48 PM      Result Value Ref Range   Glucose-Capillary 142 (*) 70 - 99 mg/dL   Comment 1 Arterial Sample    GLUCOSE, CAPILLARY     Status: Abnormal   Collection Time    07/13/14  7:53 PM      Result Value Ref Range   Glucose-Capillary 140 (*) 70 - 99 mg/dL  GLUCOSE, CAPILLARY     Status: Abnormal   Collection Time    07/13/14  9:01 PM      Result Value Ref Range   Glucose-Capillary 137 (*) 70 - 99 mg/dL   Comment 1 Arterial Sample    GLUCOSE, CAPILLARY     Status: Abnormal   Collection Time    07/13/14  9:53 PM      Result Value Ref Range   Glucose-Capillary 169 (*) 70 - 99 mg/dL   Comment 1 Arterial Sample    GLUCOSE, CAPILLARY     Status: Abnormal   Collection Time    07/13/14 10:55 PM      Result Value Ref Range   Glucose-Capillary 190 (*) 70 - 99 mg/dL   Comment 1 Arterial Sample    GLUCOSE, CAPILLARY     Status: Abnormal   Collection Time    07/14/14 12:01 AM      Result  Value Ref Range   Glucose-Capillary 199 (*) 70 - 99 mg/dL   Comment 1 Arterial Sample    GLUCOSE, CAPILLARY     Status: Abnormal   Collection Time    07/14/14  1:08 AM      Result Value Ref Range   Glucose-Capillary 195 (*) 70 - 99 mg/dL   Comment 1 Arterial Sample    GLUCOSE, CAPILLARY     Status: Abnormal   Collection Time    07/14/14  2:01 AM      Result Value Ref Range   Glucose-Capillary 162 (*) 70 - 99 mg/dL   Comment 1 Arterial Sample    GLUCOSE, CAPILLARY     Status: Abnormal   Collection Time    07/14/14  3:06 AM      Result Value Ref Range   Glucose-Capillary 128 (*) 70 - 99 mg/dL   Comment 1 Arterial Sample    BLOOD GAS, ARTERIAL     Status: Abnormal   Collection Time    07/14/14  3:54 AM      Result Value Ref Range   FIO2 0.50     Mode PRESSURE REGULATED VOLUME CONTROL     VT 460     Rate 20     Peep/cpap 5.0     pH, Arterial 7.386  7.350 - 7.450   pCO2 arterial 51.1 (*) 35.0 - 45.0 mmHg   pO2, Arterial 83.7  80.0 - 100.0 mmHg   Bicarbonate 30.0 (*) 20.0 - 24.0 mEq/L   TCO2 31.6  0 - 100 mmol/L   Acid-Base Excess 5.2 (*) 0.0 - 2.0 mmol/L   O2 Saturation 96.3     Patient temperature 98.6     Collection site A-LINE     Drawn by [id number]     Sample type ARTERIAL DRAW     Allens test (pass/fail) PASS  PASS  GLUCOSE, CAPILLARY     Status: Abnormal   Collection Time    07/14/14  4:05 AM  Result Value Ref Range   Glucose-Capillary 133 (*) 70 - 99 mg/dL   Comment 1 Arterial Sample    BASIC METABOLIC PANEL     Status: Abnormal   Collection Time    07/14/14  4:40 AM      Result Value Ref Range   Sodium 144  137 - 147 mEq/L   Potassium 3.8  3.7 - 5.3 mEq/L   Chloride 98  96 - 112 mEq/L   CO2 28  19 - 32 mEq/L   Glucose, Bld 138 (*) 70 - 99 mg/dL   BUN 82 (*) 6 - 23 mg/dL   Creatinine, Ser 1.86 (*) 0.50 - 1.35 mg/dL   Comment: RESULT REPEATED AND VERIFIED   Calcium 8.4  8.4 - 10.5 mg/dL   GFR calc non Af Amer 39 (*) >90 mL/min   GFR calc Af Amer 46 (*)  >90 mL/min   Comment: (NOTE)     The eGFR has been calculated using the CKD EPI equation.     This calculation has not been validated in all clinical situations.     eGFR's persistently <90 mL/min signify possible Chronic Kidney     Disease.   Anion gap 18 (*) 5 - 15  CBC     Status: Abnormal   Collection Time    07/14/14  4:40 AM      Result Value Ref Range   WBC 12.2 (*) 4.0 - 10.5 K/uL   RBC 4.30  4.22 - 5.81 MIL/uL   Hemoglobin 12.2 (*) 13.0 - 17.0 g/dL   HCT 38.3 (*) 39.0 - 52.0 %   MCV 89.1  78.0 - 100.0 fL   MCH 28.4  26.0 - 34.0 pg   MCHC 31.9  30.0 - 36.0 g/dL   RDW 17.5 (*) 11.5 - 15.5 %   Platelets 340  150 - 400 K/uL  MAGNESIUM     Status: Abnormal   Collection Time    07/14/14  4:40 AM      Result Value Ref Range   Magnesium 1.3 (*) 1.5 - 2.5 mg/dL  PHOSPHORUS     Status: None   Collection Time    07/14/14  4:40 AM      Result Value Ref Range   Phosphorus 3.8  2.3 - 4.6 mg/dL  GLUCOSE, CAPILLARY     Status: Abnormal   Collection Time    07/14/14  5:20 AM      Result Value Ref Range   Glucose-Capillary 136 (*) 70 - 99 mg/dL   Comment 1 Arterial Sample    GLUCOSE, CAPILLARY     Status: Abnormal   Collection Time    07/14/14  6:08 AM      Result Value Ref Range   Glucose-Capillary 149 (*) 70 - 99 mg/dL   Comment 1 Arterial Sample    GLUCOSE, CAPILLARY     Status: Abnormal   Collection Time    07/14/14  7:00 AM      Result Value Ref Range   Glucose-Capillary 144 (*) 70 - 99 mg/dL   Comment 1 Arterial Sample    GLUCOSE, CAPILLARY     Status: Abnormal   Collection Time    07/14/14  8:09 AM      Result Value Ref Range   Glucose-Capillary 136 (*) 70 - 99 mg/dL   Comment 1 Arterial Sample    GLUCOSE, CAPILLARY     Status: Abnormal   Collection Time    07/14/14  8:55 AM  Result Value Ref Range   Glucose-Capillary 148 (*) 70 - 99 mg/dL   Comment 1 Arterial Sample    GLUCOSE, CAPILLARY     Status: Abnormal   Collection Time    07/14/14  9:57 AM       Result Value Ref Range   Glucose-Capillary 208 (*) 70 - 99 mg/dL   Comment 1 Arterial Sample    PRO B NATRIURETIC PEPTIDE     Status: Abnormal   Collection Time    07/14/14 10:14 AM      Result Value Ref Range   Pro B Natriuretic peptide (BNP) 1548.0 (*) 0 - 125 pg/mL  POCT I-STAT 3, VENOUS BLOOD GAS (G3P V)     Status: Abnormal   Collection Time    07/14/14 10:23 AM      Result Value Ref Range   pH, Ven 7.410 (*) 7.250 - 7.300   pCO2, Ven 52.7 (*) 45.0 - 50.0 mmHg   pO2, Ven 67.0 (*) 30.0 - 45.0 mmHg   Bicarbonate 33.2 (*) 20.0 - 24.0 mEq/L   TCO2 35  0 - 100 mmol/L   O2 Saturation 92.0     Acid-Base Excess 7.0 (*) 0.0 - 2.0 mmol/L   Patient temperature 99.5 F     Sample type VENOUS    GLUCOSE, CAPILLARY     Status: Abnormal   Collection Time    07/14/14 10:48 AM      Result Value Ref Range   Glucose-Capillary 106 (*) 70 - 99 mg/dL   Comment 1 Arterial Sample    GLUCOSE, CAPILLARY     Status: Abnormal   Collection Time    07/14/14 12:01 PM      Result Value Ref Range   Glucose-Capillary 196 (*) 70 - 99 mg/dL   Comment 1 Arterial Sample    GLUCOSE, CAPILLARY     Status: Abnormal   Collection Time    07/14/14  1:05 PM      Result Value Ref Range   Glucose-Capillary 172 (*) 70 - 99 mg/dL   Comment 1 Arterial Sample    GLUCOSE, CAPILLARY     Status: Abnormal   Collection Time    07/14/14  1:51 PM      Result Value Ref Range   Glucose-Capillary 206 (*) 70 - 99 mg/dL   Comment 1 Arterial Sample    GLUCOSE, CAPILLARY     Status: Abnormal   Collection Time    07/14/14  2:59 PM      Result Value Ref Range   Glucose-Capillary 161 (*) 70 - 99 mg/dL   Comment 1 Arterial Sample    GLUCOSE, CAPILLARY     Status: Abnormal   Collection Time    07/14/14  3:48 PM      Result Value Ref Range   Glucose-Capillary 152 (*) 70 - 99 mg/dL   Comment 1 Arterial Sample    GLUCOSE, CAPILLARY     Status: Abnormal   Collection Time    07/14/14  4:49 PM      Result Value Ref Range    Glucose-Capillary 151 (*) 70 - 99 mg/dL   Comment 1 Capillary Sample    GLUCOSE, CAPILLARY     Status: Abnormal   Collection Time    07/14/14  5:54 PM      Result Value Ref Range   Glucose-Capillary 167 (*) 70 - 99 mg/dL   Comment 1 Capillary Sample    GLUCOSE, CAPILLARY     Status: Abnormal  Collection Time    07/14/14  6:54 PM      Result Value Ref Range   Glucose-Capillary 167 (*) 70 - 99 mg/dL   Comment 1 Capillary Sample    GLUCOSE, CAPILLARY     Status: Abnormal   Collection Time    07/14/14  7:51 PM      Result Value Ref Range   Glucose-Capillary 145 (*) 70 - 99 mg/dL   Comment 1 Capillary Sample    GLUCOSE, CAPILLARY     Status: Abnormal   Collection Time    07/14/14  8:57 PM      Result Value Ref Range   Glucose-Capillary 133 (*) 70 - 99 mg/dL  GLUCOSE, CAPILLARY     Status: Abnormal   Collection Time    07/14/14  9:57 PM      Result Value Ref Range   Glucose-Capillary 163 (*) 70 - 99 mg/dL   Comment 1 Capillary Sample    GLUCOSE, CAPILLARY     Status: Abnormal   Collection Time    07/14/14 11:02 PM      Result Value Ref Range   Glucose-Capillary 250 (*) 70 - 99 mg/dL   Comment 1 Capillary Sample    GLUCOSE, CAPILLARY     Status: Abnormal   Collection Time    07/14/14 11:53 PM      Result Value Ref Range   Glucose-Capillary 280 (*) 70 - 99 mg/dL   Comment 1 Capillary Sample    GLUCOSE, CAPILLARY     Status: Abnormal   Collection Time    07/15/14 12:59 AM      Result Value Ref Range   Glucose-Capillary 206 (*) 70 - 99 mg/dL   Comment 1 Capillary Sample    GLUCOSE, CAPILLARY     Status: Abnormal   Collection Time    07/15/14  2:00 AM      Result Value Ref Range   Glucose-Capillary 193 (*) 70 - 99 mg/dL   Comment 1 Capillary Sample    GLUCOSE, CAPILLARY     Status: Abnormal   Collection Time    07/15/14  3:06 AM      Result Value Ref Range   Glucose-Capillary 166 (*) 70 - 99 mg/dL   Comment 1 Capillary Sample    GLUCOSE, CAPILLARY     Status:  Abnormal   Collection Time    07/15/14  4:13 AM      Result Value Ref Range   Glucose-Capillary 127 (*) 70 - 99 mg/dL   Comment 1 Arterial Sample    BASIC METABOLIC PANEL     Status: Abnormal   Collection Time    07/15/14  4:15 AM      Result Value Ref Range   Sodium 148 (*) 137 - 147 mEq/L   Potassium 4.1  3.7 - 5.3 mEq/L   Chloride 97  96 - 112 mEq/L   CO2 36 (*) 19 - 32 mEq/L   Glucose, Bld 131 (*) 70 - 99 mg/dL   BUN 78 (*) 6 - 23 mg/dL   Creatinine, Ser 1.37 (*) 0.50 - 1.35 mg/dL   Calcium 9.1  8.4 - 10.5 mg/dL   GFR calc non Af Amer 57 (*) >90 mL/min   GFR calc Af Amer 66 (*) >90 mL/min   Comment: (NOTE)     The eGFR has been calculated using the CKD EPI equation.     This calculation has not been validated in all clinical situations.  eGFR's persistently <90 mL/min signify possible Chronic Kidney     Disease.   Anion gap 15  5 - 15  CBC     Status: Abnormal   Collection Time    07/15/14  4:15 AM      Result Value Ref Range   WBC 10.2  4.0 - 10.5 K/uL   RBC 4.39  4.22 - 5.81 MIL/uL   Hemoglobin 12.4 (*) 13.0 - 17.0 g/dL   HCT 38.8 (*) 39.0 - 52.0 %   MCV 88.4  78.0 - 100.0 fL   MCH 28.2  26.0 - 34.0 pg   MCHC 32.0  30.0 - 36.0 g/dL   RDW 17.2 (*) 11.5 - 15.5 %   Platelets 300  150 - 400 K/uL  MAGNESIUM     Status: Abnormal   Collection Time    07/15/14  4:15 AM      Result Value Ref Range   Magnesium 1.3 (*) 1.5 - 2.5 mg/dL  PHOSPHORUS     Status: None   Collection Time    07/15/14  4:15 AM      Result Value Ref Range   Phosphorus 3.7  2.3 - 4.6 mg/dL  GLUCOSE, CAPILLARY     Status: Abnormal   Collection Time    07/15/14  4:16 AM      Result Value Ref Range   Glucose-Capillary 129 (*) 70 - 99 mg/dL   Comment 1 Capillary Sample    BLOOD GAS, ARTERIAL     Status: Abnormal   Collection Time    07/15/14  5:00 AM      Result Value Ref Range   FIO2 0.40     Delivery systems VENTILATOR     Mode PRESSURE REGULATED VOLUME CONTROL     VT 460     Rate 20      Peep/cpap 5.0     pH, Arterial 7.437  7.350 - 7.450   pCO2 arterial 55.0 (*) 35.0 - 45.0 mmHg   pO2, Arterial 77.3 (*) 80.0 - 100.0 mmHg   Bicarbonate 36.4 (*) 20.0 - 24.0 mEq/L   TCO2 38.0  0 - 100 mmol/L   Acid-Base Excess 11.6 (*) 0.0 - 2.0 mmol/L   O2 Saturation 95.2     Patient temperature 99.2     Collection site ARTERIAL LINE     Drawn by [id number]     Sample type ARTERIAL DRAW     Allens test (pass/fail) PASS  PASS  GLUCOSE, CAPILLARY     Status: Abnormal   Collection Time    07/15/14  5:19 AM      Result Value Ref Range   Glucose-Capillary 129 (*) 70 - 99 mg/dL   Comment 1 Capillary Sample    GLUCOSE, CAPILLARY     Status: Abnormal   Collection Time    07/15/14  6:13 AM      Result Value Ref Range   Glucose-Capillary 148 (*) 70 - 99 mg/dL   Comment 1 Capillary Sample    GLUCOSE, CAPILLARY     Status: Abnormal   Collection Time    07/15/14  6:57 AM      Result Value Ref Range   Glucose-Capillary 134 (*) 70 - 99 mg/dL   Comment 1 Capillary Sample      Imaging: Imaging results have been reviewed  Assessment/Plan:   1. Principal Problem: 2.   Cardiac arrest 3. Active Problems: 4.   Acute on chronic kidney failure 5.   Hyperkalemia 6.  CHF (congestive heart failure) 7.   DM (diabetes mellitus) 8.   CAD (coronary artery disease) of artery bypass graft 9.   HTN (hypertension) 10.   COPD (chronic obstructive pulmonary disease) 11.   GERD (gastroesophageal reflux disease) 12.   Acute encephalopathy 13.   CAP (community acquired pneumonia) 107.   Time Spent Directly with Patient:  20 minutes  Length of Stay:  LOS: 4 days   Day # 5 witnessed arrest, resuscitation. Known CAD s/p remote CABG, nl EF by 2D. Pt looks much better this AM. Good diuresis. Off pressors. Plan wean vent per PCCM. Renal fxn back to baseline. Restart PO meds once extubated.  Lorretta Harp 07/15/2014, 9:17 AM

## 2014-07-15 NOTE — Progress Notes (Signed)
PULMONARY / CRITICAL CARE MEDICINE   Name: Richard [name] MRN: [id number] DOB: 09-01-1961    ADMISSION DATE:  07/11/2014 CONSULTATION DATE: 9/14  REFERRING MD :  Benjamine Mola  CHIEF COMPLAINT:  Cardiac arrest.   INITIAL PRESENTATION:  53 year old male transferred to [facility] from [place] on 9/14 w/ working dx of acute on chronic renal failure, hyperkalemia, encephalopathy and possible CAP. He was in ECHO when suddenly developed VF/VT arrest. ROSC estimated at 10-15 minutes. PCCM asked to assist w/ care s/p arrest.   STUDIES:  9/14 ECHO>>> 9/14 renal US>>>  SIGNIFICANT EVENTS: 9/14: admitted w/ acute on chronic renal failure, hyperkalemia and encephalopathy  9/14: cardiac arrest (VF/VT) while in echo  SUBJECTIVE:  Alert and interactive, following commands.  VITAL SIGNS: Temp:  [99.2 F (37.3 C)-100.1 F (37.8 C)] 99.9 F (37.7 C) (09/18 0735) Pulse Rate:  [92-110] 95 (09/18 0900) Resp:  [0-26] 20 (09/18 0900) BP: (86-167)/(4-94) 119/51 mmHg (09/18 0900) SpO2:  [87 %-98 %] 94 % (09/18 0900) Arterial Line BP: (109-186)/(44-75) 147/52 mmHg (09/18 0900) FiO2 (%):  [40 %] 40 % (09/18 0806) Weight:  [97.8 kg (215 lb 9.8 oz)] 97.8 kg (215 lb 9.8 oz) (09/18 0442)  HEMODYNAMICS: CVP:  [10 mmHg] 10 mmHg  VENTILATOR SETTINGS: Vent Mode:  [-] PSV FiO2 (%):  [40 %] 40 % Set Rate:  [19 bmp-20 bmp] 19 bmp Vt Set:  [460 mL] 460 mL PEEP:  [5 cmH20] 5 cmH20 Pressure Support:  [5 cmH20-8 cmH20] 5 cmH20 Plateau Pressure:  [19 cmH20-24 cmH20] 20 cmH20  INTAKE / OUTPUT:  Intake/Output Summary (Last 24 hours) at 07/15/14 1012 Last data filed at 07/15/14 0900  Gross per 24 hour  Intake 1870.49 ml  Output   6375 ml  Net -4504.51 ml   PHYSICAL EXAMINATION: General: Obese 53 year old male, much more awake and interactive this AM, following commands. Neuro: Awake and interactive, moving all ext to command. HEENT: Orally intubated. No clear JVD Cardiovascular: Distant, RRR, Nl S1/S2,  -M/R/G. Lungs: Coarse BS diffusely. Abdomen: Soft, non-tender. + bowel sounds obese Musculoskeletal:  Intact  Skin:  LE edema improved   LABS:  CBC  Recent Labs Lab 07/13/14 0435 07/14/14 0440 07/15/14 0415  WBC 11.5* 12.2* 10.2  HGB 11.8* 12.2* 12.4*  HCT 37.2* 38.3* 38.8*  PLT 327 340 300     Coag's  Recent Labs Lab 07/11/14 0640  APTT 38*  INR 1.10   BMET  Recent Labs Lab 07/13/14 0435 07/14/14 0440 07/15/14 0415  NA 142 144 148*  K 4.4 3.8 4.1  CL 101 98 97  CO2 25 28 36*  BUN 81* 82* 78*  CREATININE 3.03* 1.86* 1.37*  GLUCOSE 270* 138* 131*   Electrolytes  Recent Labs Lab 07/13/14 0435 07/14/14 0440 07/15/14 0415  CALCIUM 7.6* 8.4 9.1  MG 1.4* 1.3* 1.3*  PHOS 4.3 3.8 3.7   Sepsis Markers  Recent Labs Lab 07/11/14 0915 07/11/14 1200 07/12/14 0400 07/13/14 0435  LATICACIDVEN 3.1*  --   --   --   PROCALCITON  --  0.42 2.53 1.56   ABG   Recent Labs Lab 07/13/14 0522 07/14/14 0354 07/15/14 0500  PHART 7.344* 7.386 7.437  PCO2ART 47.2* 51.1* 55.0*  PO2ART 88.6 83.7 77.3*   Liver Enzymes  Recent Labs Lab 07/11/14 0915 07/12/14 0400  AST 24 20  ALT 39 34  ALKPHOS 113 89  BILITOT 0.3 0.3  ALBUMIN 3.4* 3.0*   Cardiac Enzymes  Recent Labs Lab 07/11/14  0915 07/11/14 2240 07/12/14 0400 07/14/14 1014  TROPONINI 0.43* 1.28* 1.14*  --   PROBNP  --   --   --  1548.0*   Glucose  Recent Labs Lab 07/15/14 0306 07/15/14 0413 07/15/14 0416 07/15/14 0519 07/15/14 0613 07/15/14 0657  GLUCAP 166* 127* 129* 129* 148* 134*   Imaging Dg Chest Port 1 View  07/14/2014   CLINICAL DATA:  CHF, COPD, and renal failure  EXAM: PORTABLE CHEST - 1 VIEW  COMPARISON:  Portable chest x-ray of 13 July 2014  FINDINGS: The lungs are adequately inflated. The interstitial markings are coarse but have improved since yesterday's study. The left hemidiaphragm is better demonstrated. There remain small amounts of pleural fluid bilaterally. The  cardiopericardial silhouette remains enlarged. The pulmonary vascularity is more distinct today but remains engorged.  The endotracheal tube tip lies 2.2 cm above the crotch of the carina. The esophagogastric tube tip projects off the inferior margin of the image. The left internal jugular venous catheter tip projects at the junction of the right and left brachiocephalic veins.  IMPRESSION: 1. There has been mild improvement in the appearance of the pulmonary interstitium consistent with resolving interstitial edema. 2. The endotracheal tube tip is closer to the carina today lying 2.2 cm above it. Withdrawal by 2 cm would be useful to avoid accidental mainstem bronchus intubation with patient movement. 3. The other support tubes and lines are unchanged in position. 4. These results will be called to the ordering clinician or representative by the Radiologist Assistant, and communication documented in the PACS or zVision Dashboard.   Electronically Signed   By: David  [place]   On: 07/14/2014 07:38     ASSESSMENT / PLAN:  PULMONARY OETT 9/14>>>9/18 A: acute respiratory failure s/p cardiopulm arrest.      Pulmonary infiltrates R>L  > favor pulm edema but with leukocytosis  P:   Extubate today. No hypothermia protocol  Titrate O2 for sats OOB to chair IS per RT protocol PT eval  CARDIOVASCULAR CVL A:  VF/VT arrest. 10-15 minutes to ROSC  Circulatory shock: prelim echo OK... ? Sepsis vs cardiogenic  Pulmonary edema  P:  D/ced levaquin given concern for qtc changes Dc antihypertensives D/C pressors. Hold diureses for today (Na rising).  RENAL A:   Acute on chronic renal failure (baseline scr 1.5-2.0) Hyperkalemia > improving  P:   BMET in AM D/Ced bicarb gtt D/C lasix. Once Na starts to normalize will consider a standing dose of lasix to go home on Replace electrolytes as indicated.  GASTROINTESTINAL A:   Obesity  P:   D/C TF Swallow evaluation PPI   HEMATOLOGIC A:   No  acute issues   P:  Trend CBC and coags  Sasser heparin   INFECTIOUS A:  Severe CAP? P:   BCx2 9/14>>> Sputum 9/14>>> Abx: levaquin, start date 9/14>>>9/14 ABX: doxy 9/14>>>9/18 ABX rocephin 9/14>>>9/21  ENDOCRINE A:   DM P:   SSI protocol  CBGs  NEUROLOGIC A:   Acute encephalopathy s/p cardiac arrest > improving P:   D/C all sedation. Haldol if agitated.  TODAY'S SUMMARY:  Extubate today, if fails will reintubate and consider tracheostomy pending airway protection concerns (family currently discussing this), hold diureses for today, stop doxy today and will continue rocephin for total of 8 days.  I have personally obtained a history, examined the patient, evaluated laboratory and imaging results, formulated the assessment and plan and placed orders.  CRITICAL CARE: The patient is critically ill with multiple  organ systems failure and requires high complexity decision making for assessment and support, frequent evaluation and titration of therapies, application of advanced monitoring technologies and extensive interpretation of multiple databases. Critical Care Time devoted to patient care services described in this note is 35 minutes.   Alyson Reedy, M.D. [facility] Pulmonary/Critical Care Medicine. Pager: [phone]. After hours pager: [phone].  07/15/2014, 10:12 AM

## 2014-07-15 NOTE — Procedures (Signed)
Extubation Procedure Note  Patient Details:   Name: Richard [name] DOB: 06/05/1961 MRN: [id number]   Airway Documentation:  Airway 8 mm (Active)  Secured at (cm) 24 cm 07/15/2014  8:06 AM  Measured From Lips 07/15/2014  8:06 AM  Secured Location Right 07/15/2014  8:06 AM  Secured By [employer] 07/15/2014  8:06 AM  Tube Holder Repositioned Yes 07/15/2014  8:06 AM  Cuff Pressure (cm H2O) 25 cm H2O 07/15/2014 12:15 AM  Site Condition Dry 07/15/2014  8:06 AM   Pt suctioned via ett prior to extubation, placed on 6lpm Bruin sats 91%, achieved 500cc's on incentive spirometer x 10 followed by productive cough.   Evaluation  O2 sats: stable throughout Complications: No apparent complications Patient did tolerate procedure well. Bilateral Breath Sounds: Diminished Suctioning: Airway Yes  Renae Fickle 07/15/2014, 10:42 AM

## 2014-07-16 ENCOUNTER — Inpatient Hospital Stay (HOSPITAL_COMMUNITY): Payer: Medicaid Other

## 2014-07-16 LAB — GLUCOSE, CAPILLARY
GLUCOSE-CAPILLARY: 145 mg/dL — AB (ref 70–99)
GLUCOSE-CAPILLARY: 157 mg/dL — AB (ref 70–99)
GLUCOSE-CAPILLARY: 158 mg/dL — AB (ref 70–99)
GLUCOSE-CAPILLARY: 159 mg/dL — AB (ref 70–99)
GLUCOSE-CAPILLARY: 166 mg/dL — AB (ref 70–99)
GLUCOSE-CAPILLARY: 207 mg/dL — AB (ref 70–99)
GLUCOSE-CAPILLARY: 209 mg/dL — AB (ref 70–99)
GLUCOSE-CAPILLARY: 233 mg/dL — AB (ref 70–99)
Glucose-Capillary: 154 mg/dL — ABNORMAL HIGH (ref 70–99)
Glucose-Capillary: 157 mg/dL — ABNORMAL HIGH (ref 70–99)
Glucose-Capillary: 161 mg/dL — ABNORMAL HIGH (ref 70–99)
Glucose-Capillary: 163 mg/dL — ABNORMAL HIGH (ref 70–99)
Glucose-Capillary: 164 mg/dL — ABNORMAL HIGH (ref 70–99)
Glucose-Capillary: 165 mg/dL — ABNORMAL HIGH (ref 70–99)
Glucose-Capillary: 166 mg/dL — ABNORMAL HIGH (ref 70–99)
Glucose-Capillary: 171 mg/dL — ABNORMAL HIGH (ref 70–99)
Glucose-Capillary: 172 mg/dL — ABNORMAL HIGH (ref 70–99)
Glucose-Capillary: 188 mg/dL — ABNORMAL HIGH (ref 70–99)
Glucose-Capillary: 203 mg/dL — ABNORMAL HIGH (ref 70–99)
Glucose-Capillary: 204 mg/dL — ABNORMAL HIGH (ref 70–99)
Glucose-Capillary: 230 mg/dL — ABNORMAL HIGH (ref 70–99)

## 2014-07-16 LAB — BLOOD GAS, ARTERIAL
Acid-Base Excess: 13.8 mmol/L — ABNORMAL HIGH (ref 0.0–2.0)
BICARBONATE: 38.1 meq/L — AB (ref 20.0–24.0)
Drawn by: 40530
FIO2: 0.9 %
MECHVT: 460 mL
O2 Saturation: 99.8 %
PCO2 ART: 49.9 mmHg — AB (ref 35.0–45.0)
PEEP: 5 cmH2O
PH ART: 7.497 — AB (ref 7.350–7.450)
Patient temperature: 99.3
RATE: 20 resp/min
TCO2: 39.6 mmol/L (ref 0–100)
pO2, Arterial: 217 mmHg — ABNORMAL HIGH (ref 80.0–100.0)

## 2014-07-16 LAB — BASIC METABOLIC PANEL
Anion gap: 15 (ref 5–15)
BUN: 83 mg/dL — ABNORMAL HIGH (ref 6–23)
CHLORIDE: 100 meq/L (ref 96–112)
CO2: 36 mEq/L — ABNORMAL HIGH (ref 19–32)
CREATININE: 1.47 mg/dL — AB (ref 0.50–1.35)
Calcium: 9.1 mg/dL (ref 8.4–10.5)
GFR calc Af Amer: 61 mL/min — ABNORMAL LOW (ref >90)
GFR calc non Af Amer: 52 mL/min — ABNORMAL LOW (ref >90)
Glucose, Bld: 166 mg/dL — ABNORMAL HIGH (ref 70–99)
Potassium: 4.3 mEq/L (ref 3.7–5.3)
Sodium: 151 mEq/L — ABNORMAL HIGH (ref 137–147)

## 2014-07-16 LAB — HEPARIN LEVEL (UNFRACTIONATED): Heparin Unfractionated: 0.54 IU/mL (ref 0.30–0.70)

## 2014-07-16 LAB — CBC
HCT: 36.9 % — ABNORMAL LOW (ref 39.0–52.0)
HEMOGLOBIN: 11.9 g/dL — AB (ref 13.0–17.0)
MCH: 28.6 pg (ref 26.0–34.0)
MCHC: 32.2 g/dL (ref 30.0–36.0)
MCV: 88.7 fL (ref 78.0–100.0)
Platelets: 330 10*3/uL (ref 150–400)
RBC: 4.16 MIL/uL — ABNORMAL LOW (ref 4.22–5.81)
RDW: 17.1 % — AB (ref 11.5–15.5)
WBC: 10.1 10*3/uL (ref 4.0–10.5)

## 2014-07-16 LAB — PHOSPHORUS: PHOSPHORUS: 2.8 mg/dL (ref 2.3–4.6)

## 2014-07-16 LAB — MAGNESIUM: Magnesium: 2.4 mg/dL (ref 1.5–2.5)

## 2014-07-16 LAB — D-DIMER, QUANTITATIVE (NOT AT ARMC): D DIMER QUANT: 1.48 ug{FEU}/mL — AB (ref 0.00–0.48)

## 2014-07-16 MED ORDER — HEPARIN (PORCINE) IN NACL 100-0.45 UNIT/ML-% IJ SOLN
1300.0000 [IU]/h | INTRAMUSCULAR | Status: AC
  Administered 2014-07-16 – 2014-07-17 (×2): 1200 [IU]/h via INTRAVENOUS
  Administered 2014-07-19 – 2014-07-20 (×2): 1300 [IU]/h via INTRAVENOUS
  Filled 2014-07-16 (×10): qty 250

## 2014-07-16 MED ORDER — VITAL HIGH PROTEIN PO LIQD
1000.0000 mL | ORAL | Status: DC
  Administered 2014-07-16: 1000 mL
  Administered 2014-07-16 (×3)
  Administered 2014-07-17 – 2014-07-20 (×4): 1000 mL
  Administered 2014-07-20: 09:00:00
  Administered 2014-07-21 – 2014-07-22 (×3): 1000 mL
  Filled 2014-07-16 (×12): qty 1000

## 2014-07-16 MED ORDER — FREE WATER
200.0000 mL | Freq: Three times a day (TID) | Status: DC
  Administered 2014-07-16 – 2014-07-18 (×6): 200 mL

## 2014-07-16 MED ORDER — HEPARIN BOLUS VIA INFUSION
3000.0000 [IU] | Freq: Once | INTRAVENOUS | Status: AC
  Administered 2014-07-16: 3000 [IU] via INTRAVENOUS
  Filled 2014-07-16: qty 3000

## 2014-07-16 MED ORDER — DEXTROSE 50 % IV SOLN
INTRAVENOUS | Status: AC
  Administered 2014-07-16: 50 mL via INTRAVENOUS
  Filled 2014-07-16: qty 50

## 2014-07-16 MED ORDER — DEXTROSE 50 % IV SOLN
16.0000 mL | Freq: Once | INTRAVENOUS | Status: AC
  Administered 2014-07-16: 50 mL via INTRAVENOUS
  Filled 2014-07-16: qty 50

## 2014-07-16 MED ORDER — PRO-STAT SUGAR FREE PO LIQD
60.0000 mL | Freq: Two times a day (BID) | ORAL | Status: DC
  Administered 2014-07-16 – 2014-07-24 (×14): 60 mL via ORAL
  Administered 2014-07-25 (×2): via ORAL
  Administered 2014-07-26 (×2): 60 mL via ORAL
  Filled 2014-07-16 (×24): qty 60

## 2014-07-16 MED ORDER — METHYLPREDNISOLONE SODIUM SUCC 40 MG IJ SOLR
40.0000 mg | Freq: Every day | INTRAMUSCULAR | Status: DC
  Administered 2014-07-16: 40 mg via INTRAVENOUS
  Filled 2014-07-16 (×2): qty 1

## 2014-07-16 NOTE — Progress Notes (Signed)
NUTRITION FOLLOW UP  Intervention:   Initiate Vital High Protein @ 20 ml/hr via OG tube and increase by 10 ml every 4 hours to goal rate of 40 ml/hr.   60 ml Prostat BID.   Tube feeding regimen provides 1360 kcal (69% of needs), 144 grams of protein, and 802 ml of H2O.    Nutrition Dx:   Inadequate oral intake related to inability to eat as evidenced by NPO status  Goal:   Enteral nutrition to provide 60-70% of estimated calorie needs (22-25 kcals/kg ideal body weight) and 100% of estimated protein needs, based on ASPEN guidelines for permissive underfeeding in critically ill obese individuals.  Monitor:   Respiratory status, TF initiation and tolerance, weight trends, labs  Assessment:   Pt transferred from [facility] with acute on chronic renal failure with hyperkalemia.  - Pt failed extubation on 9/18 and was immediately re-intubated. - RD received consult to re-start TF.  - MD with ongoing discussion with family regarding if they would want trach/PEG. Patient is currently intubated on ventilator support MV: 9.2 L/min Temp (24hrs), Avg:99.4 F (37.4 C), Min:98.8 F (37.1 C), Max:99.9 F (37.7 C)   Height: Ht Readings from Last 1 Encounters:  07/11/14  (1.6 m)    Weight Status:   Wt Readings from Last 1 Encounters:  07/16/14 211 lb 10.3 oz (96 kg)    Re-estimated needs:  Kcal: 1962 Protein: >/= 140 g Fluid: Per MD  Skin: intact  Diet Order: NPO   Intake/Output Summary (Last 24 hours) at 07/16/14 0936 Last data filed at 07/16/14 0800  Gross per 24 hour  Intake 547.81 ml  Output   2125 ml  Net -1577.19 ml    Last BM: 9/14   Labs:   Recent Labs Lab 07/14/14 0440 07/15/14 0415 07/15/14 1200 07/16/14 0422  NA 144 148*  --  151*  K 3.8 4.1  --  4.3  CL 98 97  --  100  CO2 28 36*  --  36*  BUN 82* 78*  --  83*  CREATININE 1.86* 1.37*  --  1.47*  CALCIUM 8.4 9.1  --  9.1  MG 1.3* 1.3* 2.0 2.4  PHOS 3.8 3.7  --  2.8  GLUCOSE 138*  131*  --  166*    CBG (last 3)   Recent Labs  07/16/14 0127 07/16/14 0229 07/16/14 0333  GLUCAP 154* 166* 172*    Scheduled Meds: . albuterol  2.5 mg Nebulization Q6H  . antiseptic oral rinse  7 mL Mouth Rinse QID  . atorvastatin  80 mg Per Tube q1800  . cefTRIAXone (ROCEPHIN)  IV  1 g Intravenous Q24H  . chlorhexidine  15 mL Mouth Rinse BID  . feeding supplement (VITAL HIGH PROTEIN)  1,000 mL Per Tube Q24H  . free water  200 mL Per Tube 3 times per day  . heparin  3,000 Units Intravenous Once  . methylPREDNISolone (SOLU-MEDROL) injection  40 mg Intravenous Daily  . pantoprazole (PROTONIX) IV  40 mg Intravenous Daily    Continuous Infusions: . sodium chloride 15 mL/hr at 07/15/14 2330  . fentaNYL infusion INTRAVENOUS 25 mcg/hr (07/15/14 2256)  . heparin    . insulin (NOVOLIN-R) infusion 6.7 Units/hr (07/16/14 0840)    Ebbie Latus RD, LDN

## 2014-07-16 NOTE — Progress Notes (Addendum)
ANTICOAGULATION CONSULT NOTE - Initial Consult  Pharmacy Consult for Heparin Indication: R/o PE  No Known Allergies  Patient Measurements: Height:  (160 cm) Weight: 211 lb 10.3 oz (96 kg) IBW/kg (Calculated) : 56.9 Heparin Dosing Weight: 78.6 kg  Vital Signs: Temp: 99.5 F (37.5 C) (09/19 0736) Temp src: Oral (09/19 0736) BP: 109/43 mmHg (09/19 0800) Pulse Rate: 89 (09/19 0830)  Labs:  Recent Labs  07/14/14 0440 07/15/14 0415 07/16/14 0422  HGB 12.2* 12.4* 11.9*  HCT 38.3* 38.8* 36.9*  PLT 340 300 330  CREATININE 1.86* 1.37* 1.47*    Estimated Creatinine Clearance: 58.9 ml/min (by C-G formula based on Cr of 1.47).   Medical History: Past Medical History  Diagnosis Date  . CAD (coronary artery disease) of artery bypass graft 07/11/2014  . Cardiac arrest 07/11/2014  . CHF (congestive heart failure) 07/11/2014  . HTN (hypertension) 07/11/2014  . COPD (chronic obstructive pulmonary disease) 07/11/2014  . DM (diabetes mellitus) 07/11/2014  . GERD (gastroesophageal reflux disease) 07/11/2014  . Acute on chronic kidney failure 07/11/2014    Medications:  Scheduled:  . albuterol  2.5 mg Nebulization Q6H  . antiseptic oral rinse  7 mL Mouth Rinse QID  . atorvastatin  80 mg Per Tube q1800  . cefTRIAXone (ROCEPHIN)  IV  1 g Intravenous Q24H  . chlorhexidine  15 mL Mouth Rinse BID  . feeding supplement (VITAL HIGH PROTEIN)  1,000 mL Per Tube Q24H  . free water  200 mL Per Tube 3 times per day  . heparin subcutaneous  5,000 Units Subcutaneous 3 times per day  . methylPREDNISolone (SOLU-MEDROL) injection  40 mg Intravenous Daily  . pantoprazole (PROTONIX) IV  40 mg Intravenous Daily   Infusions:  . sodium chloride 15 mL/hr at 07/15/14 2330  . fentaNYL infusion INTRAVENOUS 25 mcg/hr (07/15/14 2256)  . insulin (NOVOLIN-R) infusion 6.7 Units/hr (07/16/14 0840)    Assessment: 53 yo m transferred from [facility] on 9/14 for weakness, AMS, possible CAP and  hyperkalemia. Pt is s/p VT/VF cardiac arrest in echo lab on 9/14 with ROSC in 16 mins. Hypothermia protocol was initiated, but cancelled d/t pt regaining consciousness. Pharmacy is now consulted to begin heparin for r/o PE. Pending d-dimer and LE dopplers. H/H slightly low but stable, plt wnl and stable with no reported s/s bleeding.    Goal of Therapy:  Heparin level 0.3-0.7 units/ml Monitor platelets by anticoagulation protocol: Yes   Plan:  Give 3000 units bolus x 1 (lower bolus since has been receiving SQ hep Start heparin infusion at 1200 units/hr Check anti-Xa level in 6 hours and daily while on heparin Continue to monitor H&H and platelets  Margie Billet, PharmD Clinical Pharmacist - Resident Pager: [phone] Pharmacy: [phone] 07/16/2014 9:14 AM   Addendum: Heparin level is therapeutic at 0.54 on 1200 units/hr. No bleeding noted.  Continue heparin drip at 1200 units/hr Confirmatory heparin level at 23:00  [facility], [place].D., BCPS Clinical Pharmacist Pager: [phone] 07/16/2014 6:13 PM

## 2014-07-16 NOTE — Progress Notes (Signed)
CRITICAL VALUE ALERT  Critical value received:  CBG 61  Date of notification: 07/16/14  Time of notification:  1300  Critical value read back:Yes.    Nurse who received alert:  Melonie Florida  MD notified (1st page): Dr Vassie Loll

## 2014-07-16 NOTE — Progress Notes (Signed)
PULMONARY / CRITICAL CARE MEDICINE   Name: Richard [name] MRN: [id number] DOB: 1961-02-16    ADMISSION DATE:  07/11/2014 CONSULTATION DATE: 9/14  REFERRING MD :  Benjamine Mola  CHIEF COMPLAINT:  Cardiac arrest.   INITIAL PRESENTATION:  53 year old male transferred to [facility] from [place] on 9/14 w/ working dx of acute on chronic renal failure, hyperkalemia, encephalopathy and possible CAP. He was in ECHO when suddenly developed VF/VT arrest. ROSC estimated at 10-15 minutes. PCCM asked to assist w/ care s/p arrest.   STUDIES:  9/14 ECHO limited >> stopped due to cardiac arrest 9/15 ECHO >> EF 65-70%, no rwma, RV mildly dilated, PA peak 71 9/14 renal US >> no hydro, 5 mm non-obs renal calculi in R lower pole, small ascites in RUQ  SIGNIFICANT EVENTS: 9/14  Admitted w/ acute on chronic renal failure, hyperkalemia and encephalopathy  9/14  Cardiac arrest (VF/VT) while in echo 9/18  Failed extubation with immediate need for re-intubation  SUBJECTIVE:  RN reports concerns regarding restarting TF, no acute events overnight.  Remains on fentanyl, insulin gtt  VITAL SIGNS: Temp:  [98.8 F (37.1 C)-99.9 F (37.7 C)] 99.5 F (37.5 C) (09/19 0736) Pulse Rate:  [73-120] 91 (09/19 0736) Resp:  [15-32] 21 (09/19 0736) BP: (81-162)/(42-68) 109/48 mmHg (09/19 0736) SpO2:  [86 %-98 %] 96 % (09/19 0736) Arterial Line BP: (96-186)/(47-67) 145/58 mmHg (09/19 0600) FiO2 (%):  [40 %-100 %] 50 % (09/19 0721) Weight:  [211 lb 10.3 oz (96 kg)] 211 lb 10.3 oz (96 kg) (09/19 0436)  HEMODYNAMICS:    VENTILATOR SETTINGS: Vent Mode:  [-] PRVC FiO2 (%):  [40 %-100 %] 50 % Set Rate:  [19 bmp-20 bmp] 20 bmp Vt Set:  [460 mL] 460 mL PEEP:  [5 cmH20] 5 cmH20 Pressure Support:  [5 cmH20] 5 cmH20 Plateau Pressure:  [18 cmH20-24 cmH20] 18 cmH20  INTAKE / OUTPUT:  Intake/Output Summary (Last 24 hours) at 07/16/14 0744 Last data filed at 07/16/14 0630  Gross per 24 hour  Intake 623.32 ml  Output   2650 ml   Net -2026.68 ml   PHYSICAL EXAMINATION: General: Obese male, in NAD on vent Neuro: sedate HEENT: Orally intubated. No clear JVD Cardiovascular: Distant, RRR, Nl S1/S2, -M/R/G. Lungs: Coarse BS diffusely. Abdomen: Soft, non-tender. + bowel sounds obese Musculoskeletal:  Intact  Skin:  LE edema improved   LABS:  CBC  Recent Labs Lab 07/14/14 0440 07/15/14 0415 07/16/14 0422  WBC 12.2* 10.2 10.1  HGB 12.2* 12.4* 11.9*  HCT 38.3* 38.8* 36.9*  PLT 340 300 330     Coag's  Recent Labs Lab 07/11/14 0640  APTT 38*  INR 1.10   BMET  Recent Labs Lab 07/14/14 0440 07/15/14 0415 07/16/14 0422  NA 144 148* 151*  K 3.8 4.1 4.3  CL 98 97 100  CO2 28 36* 36*  BUN 82* 78* 83*  CREATININE 1.86* 1.37* 1.47*  GLUCOSE 138* 131* 166*   Electrolytes  Recent Labs Lab 07/14/14 0440 07/15/14 0415 07/15/14 1200 07/16/14 0422  CALCIUM 8.4 9.1  --  9.1  MG 1.3* 1.3* 2.0 2.4  PHOS 3.8 3.7  --  2.8   Sepsis Markers  Recent Labs Lab 07/11/14 0915 07/11/14 1200 07/12/14 0400 07/13/14 0435  LATICACIDVEN 3.1*  --   --   --   PROCALCITON  --  0.42 2.53 1.56   ABG   Recent Labs Lab 07/15/14 0500 07/15/14 1546 07/16/14 0414  PHART 7.437 7.344* 7.497*  PCO2ART  55.0* 77.1* 49.9*  PO2ART 77.3* 99.0 217.0*   Liver Enzymes  Recent Labs Lab 07/11/14 0915 07/12/14 0400  AST 24 20  ALT 39 34  ALKPHOS 113 89  BILITOT 0.3 0.3  ALBUMIN 3.4* 3.0*   Cardiac Enzymes  Recent Labs Lab 07/11/14 0915 07/11/14 2240 07/12/14 0400 07/14/14 1014  TROPONINI 0.43* 1.28* 1.14*  --   PROBNP  --   --   --  1548.0*   Glucose  Recent Labs Lab 07/15/14 2236 07/15/14 2330 07/16/14 0030 07/16/14 0127 07/16/14 0229 07/16/14 0333  GLUCAP 157* 158* 163* 154* 166* 172*   Imaging Dg Abd 1 View  07/15/2014   CLINICAL DATA:  . G-tube placement.  EXAM: ABDOMEN - 1 VIEW  COMPARISON:  None.  FINDINGS: Tip of the orogastric tube extends into the left upper quadrant, well  within the stomach.  Bowel gas pattern is unremarkable.  IMPRESSION: Orogastric tube tip lies within the mid stomach.   Electronically Signed   By: Amie Portland M.D.   On: 07/15/2014 18:52   Dg Chest Port 1 View  07/15/2014   CLINICAL DATA:  Status post endotracheal tube placement  EXAM: PORTABLE CHEST - 1 VIEW  COMPARISON:  Portable chest x-ray of earlier today at 5:28 a.m.  FINDINGS: The endotracheal tube tip lies approximately 2.4 cm above the crotch of the carina. The lungs are adequately inflated. The cardiopericardial silhouette is mildly enlarged. The perihilar lung markings on the right have become more conspicuous. The left internal jugular venous catheter tip overlies the junction of the right and left brachiocephalic veins. There is a small amount of pleural fluid bilaterally.  IMPRESSION: The endotracheal tube tip lies approximately 2.4 cm above the crotch of the carina. Withdrawal by 2 cm is recommended. Increased right perihilar density may reflect pulmonary edema or subsegmental atelectasis.  These results will be called to the ordering clinician or representative by the Radiologist Assistant, and communication documented in the PACS or zVision Dashboard.   Electronically Signed   By: David  [place]   On: 07/15/2014 15:27   Dg Chest Port 1 View  07/15/2014   CLINICAL DATA:  Check endotracheal tube placement  EXAM: PORTABLE CHEST - 1 VIEW  COMPARISON:  07/14/2014  FINDINGS: The endotracheal tube is again identified 12.5 mm above the carina. A nasogastric catheter is noted within the stomach. The left central venous line is again seen in the left innominate vein.  The cardiac shadow is stable. Lungs are well aerated bilaterally without focal infiltrate. Mild central vascular congestion remains. No sizable effusion is noted.  IMPRESSION: Endotracheal tube as described.  This could be withdrawn 1-2 cm.  The remainder the exam is stable in appearance.  These results will be called to the ordering  clinician or representative by the Radiologist Assistant, and communication documented in the PACS or zVision Dashboard.   Electronically Signed   By: Alcide Clever M.D.   On: 07/15/2014 07:55     ASSESSMENT / PLAN:  PULMONARY OETT 9/14>>>9/18, 9/18>> A:  Acute respiratory failure s/p cardiopulm arrest.  Pulmonary Edema Pulmonary infiltrates R>L  > favor pulm edema but with leukocytosis PAH - noted on ECHO, PA peak 71, R/O PE smoker P:   Continue vent support, 8 cc/kg Ongoing discussion with family regarding trach/PEG (?? if they would want) Titrate O2 for sats Daily SBT Wean steroids (? If started for bronchospasm) Assess LE dopplers for DVT, D-Dimer, ? If this was a PE as source of arrest.  Holding CTA due to AKI.  Begin empiric heparin gtt for PE, no lovenox with renal hx  CARDIOVASCULAR CVL A:  VF/VT arrest. 10-15 minutes to ROSC  Circulatory shock: prelim echo OK... ? Sepsis vs cardiogenic  Pulmonary Edema  R/O PE P:  D/ced levaquin given concern for qtc changes Hold antihypertensives Hold diureses for today (Na rising), see renal   RENAL A:   Acute on chronic renal failure (baseline scr 1.5-2.0) Hyperkalemia > improving Hypernatremia P:   BMET in AM Hold lasix. Free water 200 ml Q8 Once Na starts to normalize will consider a standing dose of lasix to go home on Replace electrolytes as indicated.  GASTROINTESTINAL A:   Obesity  P:   Restart TF 9/19 PPI   HEMATOLOGIC A:   Anemia  P:  Trend CBC and coags  [place] heparin   INFECTIOUS A:   Severe CAP? P:   BCx2 9/14>>>ng Sputum 9/14>>> Abx: levaquin, start date 9/14>>>9/14 ABX: doxy 9/14>>>9/18 ABX rocephin 9/14>>>9/21 (stop date in place)  Monitor fever curve / leukocytosis   ENDOCRINE A:   DM -on high dose lantus at home P:   SSI protocol, insulin gtt  CBGs  NEUROLOGIC A:   Acute encephalopathy s/p cardiac arrest > improving P:   Fentanyl gtt Haldol if agitated.  TODAY'S SUMMARY:   Continue full support, fentanyl gtt.  Daily SBT.  Restart TF.   Will need family meeting to clarify one -way extubation, unclear who is POA here - sisters vs sons, updated nephew at bedside today  Canary Brim, NP-C [facility] Pgr: [phone] or [phone]    I have personally obtained a history, examined the patient, evaluated laboratory and imaging results, formulated the assessment and plan and placed orders.  CRITICAL CARE: The patient is critically ill with multiple organ systems failure and requires high complexity decision making for assessment and support, frequent evaluation and titration of therapies, application of advanced monitoring technologies and extensive interpretation of multiple databases. Critical Care Time devoted to patient care services described in this note is 35 minutes.   Oretha Milch MD  07/16/2014, 7:44 AM

## 2014-07-16 NOTE — Progress Notes (Signed)
PROGRESS NOTE  Subjective:   53 yo with hx  Of CAD / CABG, DM, COPD, CHF Admitted with altered mental status and hyperkalemia . He had a VF/VT cardiac arrest while in the echo lab.  ROSC estimated at 10-15 minutes.    Has improved significantly. Echo from 9/15 shows normal LV function - EF of 65-70%. , mild-mod TR, severe pulmonary HTN with PA pressure of 71 mm Hg.   He failed extubation yesterday.   Objective:    Vital Signs:   Temp:  [98.8 F (37.1 C)-99.9 F (37.7 C)] 99.5 F (37.5 C) (09/19 0736) Pulse Rate:  [73-120] 91 (09/19 0736) Resp:  [15-32] 21 (09/19 0736) BP: (81-162)/(42-68) 109/48 mmHg (09/19 0736) SpO2:  [86 %-98 %] 96 % (09/19 0736) Arterial Line BP: (96-186)/(47-67) 145/58 mmHg (09/19 0600) FiO2 (%):  [50 %-100 %] 50 % (09/19 0721) Weight:  [211 lb 10.3 oz (96 kg)] 211 lb 10.3 oz (96 kg) (09/19 0436)  Last BM Date: 07/11/14   24-hour weight change: Weight change: -3 lb 15.5 oz (-1.8 kg)  Weight trends: Filed Weights   07/14/14 0358 07/15/14 0442 07/16/14 0436  Weight: 225 lb 12 oz (102.4 kg) 215 lb 9.8 oz (97.8 kg) 211 lb 10.3 oz (96 kg)    Intake/Output:  09/18 0701 - 09/19 0700 In: 623.3 [I.V.:473.3; NG/GT:150] Out: 2650 [Urine:2650]     Physical Exam: BP 109/48  Pulse 91  Temp(Src) 99.5 F (37.5 C) (Oral)  Resp 21  Ht  (1.6 m)  Wt 211 lb 10.3 oz (96 kg)  BMI 37.50 kg/m2  SpO2 96%  Wt Readings from Last 3 Encounters:  07/16/14 211 lb 10.3 oz (96 kg)    General: Vital signs reviewed and noted. arousable  Head: Normocephalic, atraumatic.  Eyes: conjunctivae/corneas clear.  EOM's intact.   Throat: Intubated   Neck:   Thick , left IJ in place  Lungs:    clear, on vent  Heart:  RR,   Abdomen:  Soft, non-tender, obese  Extremities: Left arm is diffusely edematous 1-2+    Neurologic: Sedated   Psych: Sedated     Labs: BMET:  Recent Labs  07/15/14 0415 07/15/14 1200 07/16/14 0422  NA 148*  --  151*  K 4.1   --  4.3  CL 97  --  100  CO2 36*  --  36*  GLUCOSE 131*  --  166*  BUN 78*  --  83*  CREATININE 1.37*  --  1.47*  CALCIUM 9.1  --  9.1  MG 1.3* 2.0 2.4  PHOS 3.7  --  2.8    Liver function tests: No results found for this basename: AST, ALT, ALKPHOS, BILITOT, PROT, ALBUMIN,  in the last 72 hours No results found for this basename: LIPASE, AMYLASE,  in the last 72 hours  CBC:  Recent Labs  07/15/14 0415 07/16/14 0422  WBC 10.2 10.1  HGB 12.4* 11.9*  HCT 38.8* 36.9*  MCV 88.4 88.7  PLT 300 330    Cardiac Enzymes: No results found for this basename: CKTOTAL, CKMB, TROPONINI,  in the last 72 hours  Coagulation Studies: No results found for this basename: LABPROT, INR,  in the last 72 hours   Other results:  EKG :  NSR with RBBB.   Medications:    Infusions: . sodium chloride 15 mL/hr at 07/15/14 2330  . fentaNYL infusion INTRAVENOUS 25 mcg/hr (07/15/14 2256)  . insulin (NOVOLIN-R) infusion 6.2 Units/hr (07/16/14  0981)    Scheduled Medications: . albuterol  2.5 mg Nebulization Q6H  . antiseptic oral rinse  7 mL Mouth Rinse QID  . atorvastatin  80 mg Per Tube q1800  . cefTRIAXone (ROCEPHIN)  IV  1 g Intravenous Q24H  . chlorhexidine  15 mL Mouth Rinse BID  . feeding supplement (VITAL HIGH PROTEIN)  1,000 mL Per Tube Q24H  . free water  200 mL Per Tube 3 times per day  . heparin subcutaneous  5,000 Units Subcutaneous 3 times per day  . methylPREDNISolone (SOLU-MEDROL) injection  40 mg Intravenous Daily  . pantoprazole (PROTONIX) IV  40 mg Intravenous Daily    Assessment/ Plan:   Principal Problem:   Cardiac arrest Active Problems:   Acute on chronic kidney failure   Hyperkalemia   CHF (congestive heart failure)   DM (diabetes mellitus)   CAD (coronary artery disease) of artery bypass graft   HTN (hypertension)   COPD (chronic obstructive pulmonary disease)   GERD (gastroesophageal reflux disease)   Acute encephalopathy   CAP (community acquired  pneumonia)  1. Cardiorespiratory arrest:  Patient was reported as having VT while in the echo lab.  He had a trivial Troponin bump ( not c/w an MI) and has normal LV function.  It's possible that his arrest was due to something else.   The echo done the following day shows severe pulmonary hypertension and RV failure.   I think it will be important to evaluate him for PE.  He has a RBBB at baseline and an episode of sinus tach or paroxysmal atrial fib would look similar to VT. I have discussed with Brandi ( PCCM PA) . She has ordered d-dimer and lower ext. Venous duplex.   2. CAD:  Hx of CABG in the  Past  3. CHF - his systolic function is normal  4. Acute on chronic kidney disease:  He may need a CT angio of the lungs.  Continue to follow.    Disposition:  Length of Stay: 5  Vesta Mixer, Montez Hageman., MD, [facility] 07/16/2014, 8:17 AM Office [phone] Pager [phone]

## 2014-07-16 NOTE — Progress Notes (Signed)
SLP Cancellation Note  Patient Details Name: TRELON PLUSH MRN: [id number] DOB: 1961-06-02   Cancelled treatment:       Reason Eval/Treat Not Completed: Medical issues which prohibited therapy; pt was intubated when SLP arrived to attempt BSE; need new orders for BSE   Francina Beery,PAT, M.S., CCC-SLP 07/16/2014, 4:39 PM

## 2014-07-16 NOTE — Progress Notes (Signed)
PT R IJ CVC noted to have exposed portion and dsg displaced. During dsg change for security, line displaced from holder. CXR ordered. Canary Brim, NP paged. Fluids infusing through line stopped.

## 2014-07-17 ENCOUNTER — Inpatient Hospital Stay (HOSPITAL_COMMUNITY): Payer: Medicaid Other

## 2014-07-17 DIAGNOSIS — M79609 Pain in unspecified limb: Secondary | ICD-10-CM

## 2014-07-17 DIAGNOSIS — M7989 Other specified soft tissue disorders: Secondary | ICD-10-CM

## 2014-07-17 DIAGNOSIS — I2510 Atherosclerotic heart disease of native coronary artery without angina pectoris: Secondary | ICD-10-CM

## 2014-07-17 LAB — GLUCOSE, CAPILLARY
GLUCOSE-CAPILLARY: 196 mg/dL — AB (ref 70–99)
GLUCOSE-CAPILLARY: 171 mg/dL — AB (ref 70–99)
GLUCOSE-CAPILLARY: 163 mg/dL — AB (ref 70–99)
GLUCOSE-CAPILLARY: 168 mg/dL — AB (ref 70–99)
GLUCOSE-CAPILLARY: 144 mg/dL — AB (ref 70–99)
GLUCOSE-CAPILLARY: 157 mg/dL — AB (ref 70–99)
GLUCOSE-CAPILLARY: 194 mg/dL — AB (ref 70–99)
GLUCOSE-CAPILLARY: 190 mg/dL — AB (ref 70–99)
GLUCOSE-CAPILLARY: 144 mg/dL — AB (ref 70–99)
Glucose-Capillary: 130 mg/dL — ABNORMAL HIGH (ref 70–99)
Glucose-Capillary: 130 mg/dL — ABNORMAL HIGH (ref 70–99)
Glucose-Capillary: 153 mg/dL — ABNORMAL HIGH (ref 70–99)
Glucose-Capillary: 159 mg/dL — ABNORMAL HIGH (ref 70–99)
Glucose-Capillary: 147 mg/dL — ABNORMAL HIGH (ref 70–99)
Glucose-Capillary: 149 mg/dL — ABNORMAL HIGH (ref 70–99)
Glucose-Capillary: 159 mg/dL — ABNORMAL HIGH (ref 70–99)
Glucose-Capillary: 162 mg/dL — ABNORMAL HIGH (ref 70–99)
Glucose-Capillary: 171 mg/dL — ABNORMAL HIGH (ref 70–99)
Glucose-Capillary: 175 mg/dL — ABNORMAL HIGH (ref 70–99)
Glucose-Capillary: 176 mg/dL — ABNORMAL HIGH (ref 70–99)
Glucose-Capillary: 192 mg/dL — ABNORMAL HIGH (ref 70–99)
Glucose-Capillary: 193 mg/dL — ABNORMAL HIGH (ref 70–99)
Glucose-Capillary: 195 mg/dL — ABNORMAL HIGH (ref 70–99)

## 2014-07-17 LAB — CBC
HCT: 37.6 % — ABNORMAL LOW (ref 39.0–52.0)
HEMOGLOBIN: 11.8 g/dL — AB (ref 13.0–17.0)
MCH: 28.2 pg (ref 26.0–34.0)
MCHC: 31.4 g/dL (ref 30.0–36.0)
MCV: 90 fL (ref 78.0–100.0)
PLATELETS: 355 10*3/uL (ref 150–400)
RBC: 4.18 MIL/uL — AB (ref 4.22–5.81)
RDW: 17.3 % — ABNORMAL HIGH (ref 11.5–15.5)
WBC: 10 10*3/uL (ref 4.0–10.5)

## 2014-07-17 LAB — BASIC METABOLIC PANEL
Anion gap: 13 (ref 5–15)
BUN: 87 mg/dL — ABNORMAL HIGH (ref 6–23)
CO2: 35 mEq/L — ABNORMAL HIGH (ref 19–32)
Calcium: 8.9 mg/dL (ref 8.4–10.5)
Chloride: 101 mEq/L (ref 96–112)
Creatinine, Ser: 1.38 mg/dL — ABNORMAL HIGH (ref 0.50–1.35)
GFR calc Af Amer: 66 mL/min — ABNORMAL LOW (ref >90)
GFR calc non Af Amer: 57 mL/min — ABNORMAL LOW (ref >90)
GLUCOSE: 193 mg/dL — AB (ref 70–99)
POTASSIUM: 3.7 meq/L (ref 3.7–5.3)
Sodium: 149 mEq/L — ABNORMAL HIGH (ref 137–147)

## 2014-07-17 LAB — CULTURE, BLOOD (ROUTINE X 2)
Culture: NO GROWTH
Culture: NO GROWTH

## 2014-07-17 LAB — HEPARIN LEVEL (UNFRACTIONATED): HEPARIN UNFRACTIONATED: 0.64 [IU]/mL (ref 0.30–0.70)

## 2014-07-17 MED ORDER — WHITE PETROLATUM GEL
Status: AC
  Administered 2014-07-17: 0.2
  Filled 2014-07-17: qty 5

## 2014-07-17 MED ORDER — ARTIFICIAL TEARS OP OINT
TOPICAL_OINTMENT | OPHTHALMIC | Status: DC | PRN
  Administered 2014-07-17: 08:00:00 via OPHTHALMIC
  Filled 2014-07-17: qty 3.5

## 2014-07-17 MED ORDER — PANTOPRAZOLE SODIUM 40 MG PO PACK
40.0000 mg | PACK | Freq: Every day | ORAL | Status: DC
  Administered 2014-07-17 – 2014-07-22 (×5): 40 mg
  Filled 2014-07-17 (×7): qty 20

## 2014-07-17 MED ORDER — FUROSEMIDE 10 MG/ML IJ SOLN
40.0000 mg | Freq: Every day | INTRAMUSCULAR | Status: DC
  Administered 2014-07-17 – 2014-07-18 (×2): 40 mg via INTRAVENOUS
  Filled 2014-07-17 (×2): qty 4

## 2014-07-17 MED ORDER — METHYLPREDNISOLONE SODIUM SUCC 40 MG IJ SOLR
20.0000 mg | Freq: Every day | INTRAMUSCULAR | Status: AC
  Administered 2014-07-18 – 2014-07-19 (×2): 20 mg via INTRAVENOUS
  Filled 2014-07-17 (×2): qty 0.5

## 2014-07-17 NOTE — Progress Notes (Signed)
PROGRESS NOTE  Subjective:   53 yo with hx  Of CAD / CABG, DM, COPD, CHF Admitted with altered mental status and hyperkalemia . He had a VF/VT cardiac arrest while in the echo lab.  ROSC estimated at 10-15 minutes.    Has improved significantly. Echo from 9/15 shows normal LV function - EF of 65-70%. , mild-mod TR, severe pulmonary HTN with PA pressure of 71 mm Hg.   He failed extubation - he seems awake and alert. - he may be able to be extubated today D-dimer was minimally elevated.   Objective:    Vital Signs:   Temp:  [97.3 F (36.3 C)-100.1 F (37.8 C)] 97.3 F (36.3 C) (09/20 0728) Pulse Rate:  [60-90] 62 (09/20 0728) Resp:  [18-23] 20 (09/20 0728) BP: (148-179)/(61-70) 157/66 mmHg (09/20 0335) SpO2:  [92 %-100 %] 96 % (09/20 0748) Arterial Line BP: (124-169)/(52-74) 159/68 mmHg (09/20 0700) FiO2 (%):  [40 %-50 %] 40 % (09/20 0748) Weight:  [211 lb 3.2 oz (95.8 kg)] 211 lb 3.2 oz (95.8 kg) (09/20 0404)  Last BM Date: 07/11/14   24-hour weight change: Weight change: -7.1 oz (-0.2 kg)  Weight trends: Filed Weights   07/15/14 0442 07/16/14 0436 07/17/14 0404  Weight: 215 lb 9.8 oz (97.8 kg) 211 lb 10.3 oz (96 kg) 211 lb 3.2 oz (95.8 kg)    Intake/Output:  09/19 0701 - 09/20 0700 In: 2156 [I.V.:901; NG/GT:955; IV Piggyback:100] Out: 1750 [Urine:1750]     Physical Exam: BP 157/66  Pulse 62  Temp(Src) 97.3 F (36.3 C) (Oral)  Resp 20  Ht  (1.6 m)  Wt 211 lb 3.2 oz (95.8 kg)  BMI 37.42 kg/m2  SpO2 96%  Wt Readings from Last 3 Encounters:  07/17/14 211 lb 3.2 oz (95.8 kg)    General: Vital signs reviewed and noted. arousable  Head: Normocephalic, atraumatic.  Eyes: conjunctivae/corneas clear.  EOM's intact.   Throat: Intubated   Neck:   Thick , left IJ in place  Lungs:    clear, on vent  Heart:  RR,   Abdomen:  Soft, non-tender, obese  Extremities: Left arm is diffusely edematous 1-2+    Neurologic: Sedated   Psych: Sedated      Labs: BMET:  Recent Labs  07/15/14 0415 07/15/14 1200 07/16/14 0422 07/17/14 0500  NA 148*  --  151* 149*  K 4.1  --  4.3 3.7  CL 97  --  100 101  CO2 36*  --  36* 35*  GLUCOSE 131*  --  166* 193*  BUN 78*  --  83* 87*  CREATININE 1.37*  --  1.47* 1.38*  CALCIUM 9.1  --  9.1 8.9  MG 1.3* 2.0 2.4  --   PHOS 3.7  --  2.8  --     Liver function tests: No results found for this basename: AST, ALT, ALKPHOS, BILITOT, PROT, ALBUMIN,  in the last 72 hours No results found for this basename: LIPASE, AMYLASE,  in the last 72 hours  CBC:  Recent Labs  07/16/14 0422 07/17/14 0500  WBC 10.1 10.0  HGB 11.9* 11.8*  HCT 36.9* 37.6*  MCV 88.7 90.0  PLT 330 355    Cardiac Enzymes: No results found for this basename: CKTOTAL, CKMB, TROPONINI,  in the last 72 hours  Coagulation Studies: No results found for this basename: LABPROT, INR,  in the last 72 hours   Other results:  EKG :  NSR  with RBBB.   Medications:    Infusions: . sodium chloride 15 mL/hr at 07/16/14 1930  . fentaNYL infusion INTRAVENOUS 50 mcg/hr (07/17/14 0737)  . heparin 1,200 Units/hr (07/16/14 1930)  . insulin (NOVOLIN-R) infusion 7.2 Units/hr (07/17/14 0736)    Scheduled Medications: . albuterol  2.5 mg Nebulization Q6H  . antiseptic oral rinse  7 mL Mouth Rinse QID  . atorvastatin  80 mg Per Tube q1800  . cefTRIAXone (ROCEPHIN)  IV  1 g Intravenous Q24H  . chlorhexidine  15 mL Mouth Rinse BID  . feeding supplement (PRO-STAT SUGAR FREE 64)  60 mL Oral BID  . feeding supplement (VITAL HIGH PROTEIN)  1,000 mL Per Tube Q24H  . free water  200 mL Per Tube 3 times per day  . [START ON 07/18/2014] methylPREDNISolone (SOLU-MEDROL) injection  20 mg Intravenous Daily  . pantoprazole sodium  40 mg Per Tube Daily    Assessment/ Plan:   Principal Problem:   Cardiac arrest Active Problems:   Acute on chronic kidney failure   Hyperkalemia   CHF (congestive heart failure)   DM (diabetes mellitus)    CAD (coronary artery disease) of artery bypass graft   HTN (hypertension)   COPD (chronic obstructive pulmonary disease)   GERD (gastroesophageal reflux disease)   Acute encephalopathy   CAP (community acquired pneumonia)  1. Cardiorespiratory arrest:  Patient was reported as having VT while in the echo lab.  He had a trivial Troponin bump ( not c/w an MI) and has normal LV function.  It's possible that his arrest was due to something else.   The echo done the following day shows severe pulmonary hypertension and RV failure.   I think it will be important to evaluate him for PE.  He has a RBBB at baseline and an episode of sinus tach or paroxysmal atrial fib would look similar to VT. His d-dimer was minimally elevated.  He may need further evaluation to look for PE.   2. CAD:  Hx of CABG in the  Past  3. CHF - his systolic function is normal  4. Acute on chronic kidney disease:  He may need a CT angio of the lungs.  Continue to follow.    Disposition:  Length of Stay: 6  Vesta Mixer, Montez Hageman., MD, [facility] 07/17/2014, 8:19 AM Office [phone] Pager [phone]

## 2014-07-17 NOTE — Progress Notes (Signed)
VASCULAR LAB PRELIMINARY  PRELIMINARY  PRELIMINARY  PRELIMINARY  Bilateral lower extremity venous Dopplers completed.    Preliminary report:  There is no DVT or SVT noted in the bilateral lower extremities.   Richard [name], RVT 07/17/2014, 2:53 PM

## 2014-07-17 NOTE — Progress Notes (Addendum)
PULMONARY / CRITICAL CARE MEDICINE   Name: Richard [name] MRN: [id number] DOB: 18-May-1961    ADMISSION DATE:  07/11/2014 CONSULTATION DATE: 9/14  REFERRING MD :  Benjamine Mola  CHIEF COMPLAINT:  Cardiac arrest.   INITIAL PRESENTATION:  53 year old male transferred to [facility] from [place] on 9/14 w/ working dx of acute on chronic renal failure, hyperkalemia, encephalopathy and possible CAP. He was in ECHO when suddenly developed VF/VT arrest. ROSC estimated at 10-15 minutes. PCCM asked to assist w/ care s/p arrest.   STUDIES:  9/14 ECHO limited >> stopped due to cardiac arrest 9/15 ECHO >> EF 65-70%, no rwma, RV mildly dilated, PA peak 71 9/14 renal US >> no hydro, 5 mm non-obs renal calculi in R lower pole, small ascites in RUQ 9/19 LE Doppler >>  SIGNIFICANT EVENTS: 9/14  Admitted w/ acute on chronic renal failure, hyperkalemia and encephalopathy  9/14  Cardiac arrest (VF/VT) while in echo 9/18  Failed extubation with immediate need for re-intubation  SUBJECTIVE:  RT reports pts O2 reduced from 50 to 40%, weaning on 5/5  afebrile  VITAL SIGNS: Temp:  [97.3 F (36.3 C)-100.1 F (37.8 C)] 97.3 F (36.3 C) (09/20 0728) Pulse Rate:  [60-90] 62 (09/20 0728) Resp:  [18-23] 20 (09/20 0728) BP: (109-179)/(43-70) 157/66 mmHg (09/20 0335) SpO2:  [92 %-100 %] 96 % (09/20 0748) Arterial Line BP: (120-169)/(49-74) 159/68 mmHg (09/20 0700) FiO2 (%):  [40 %-50 %] 40 % (09/20 0748) Weight:  [211 lb 3.2 oz (95.8 kg)] 211 lb 3.2 oz (95.8 kg) (09/20 0404)  HEMODYNAMICS:    VENTILATOR SETTINGS: Vent Mode:  [-] CPAP;PSV FiO2 (%):  [40 %-50 %] 40 % Set Rate:  [20 bmp] 20 bmp Vt Set:  [460 mL] 460 mL PEEP:  [5 cmH20] 5 cmH20 Pressure Support:  [5 cmH20] 5 cmH20 Plateau Pressure:  [19 cmH20-20 cmH20] 20 cmH20  INTAKE / OUTPUT:  Intake/Output Summary (Last 24 hours) at 07/17/14 0753 Last data filed at 07/17/14 0700  Gross per 24 hour  Intake 2156.02 ml  Output   1750 ml  Net 406.02 ml    PHYSICAL EXAMINATION: General: Obese male, in NAD on vent Neuro: awake, alert, follows commands, nods appropriately HEENT: Orally intubated. No clear JVD Cardiovascular: Distant, RRR, Nl S1/S2, -M/R/G. Lungs: Coarse BS diffusely. Abdomen: Soft, non-tender. + bowel sounds obese Musculoskeletal:  Intact  Skin:  LE edema improved   LABS:  CBC  Recent Labs Lab 07/15/14 0415 07/16/14 0422 07/17/14 0500  WBC 10.2 10.1 10.0  HGB 12.4* 11.9* 11.8*  HCT 38.8* 36.9* 37.6*  PLT 300 330 355     Coag's  Recent Labs Lab 07/11/14 0640  APTT 38*  INR 1.10   BMET  Recent Labs Lab 07/15/14 0415 07/16/14 0422 07/17/14 0500  NA 148* 151* 149*  K 4.1 4.3 3.7  CL 97 100 101  CO2 36* 36* 35*  BUN 78* 83* 87*  CREATININE 1.37* 1.47* 1.38*  GLUCOSE 131* 166* 193*   Electrolytes  Recent Labs Lab 07/14/14 0440 07/15/14 0415 07/15/14 1200 07/16/14 0422 07/17/14 0500  CALCIUM 8.4 9.1  --  9.1 8.9  MG 1.3* 1.3* 2.0 2.4  --   PHOS 3.8 3.7  --  2.8  --    Sepsis Markers  Recent Labs Lab 07/11/14 0915 07/11/14 1200 07/12/14 0400 07/13/14 0435  LATICACIDVEN 3.1*  --   --   --   PROCALCITON  --  0.42 2.53 1.56   ABG   Recent  Labs Lab 07/15/14 0500 07/15/14 1546 07/16/14 0414  PHART 7.437 7.344* 7.497*  PCO2ART 55.0* 77.1* 49.9*  PO2ART 77.3* 99.0 217.0*   Liver Enzymes  Recent Labs Lab 07/11/14 0915 07/12/14 0400  AST 24 20  ALT 39 34  ALKPHOS 113 89  BILITOT 0.3 0.3  ALBUMIN 3.4* 3.0*   Cardiac Enzymes  Recent Labs Lab 07/11/14 0915 07/11/14 2240 07/12/14 0400 07/14/14 1014  TROPONINI 0.43* 1.28* 1.14*  --   PROBNP  --   --   --  1548.0*   Glucose  Recent Labs Lab 07/17/14 0038 07/17/14 0143 07/17/14 0232 07/17/14 0329 07/17/14 0438 07/17/14 0531  GLUCAP 130* 130* 153* 159* 196* 171*   Imaging Dg Chest Port 1 View  07/16/2014   CLINICAL DATA:  Central line placement  EXAM: PORTABLE CHEST - 1 VIEW  COMPARISON:  Earlier same day   FINDINGS: Endotracheal tube has its tip 3 cm above the carina. Nasogastric tube enters the stomach, with the side hole at the gastroesophageal junction. Left internal jugular central line has its tip at the junction of the innominate vein and the SVC. Mild atelectasis persists at both lung bases.  IMPRESSION: No change since the previous study. Left internal jugular central line tip at the junction of the innominate vein and SVC.   Electronically Signed   By: Paulina Fusi M.D.   On: 07/16/2014 10:56   Dg Chest Port 1 View  07/16/2014   CLINICAL DATA:  Reassess support tube positioning  EXAM: PORTABLE CHEST - 1 VIEW  COMPARISON:  Portable chest x-ray of 15 July 2014  FINDINGS: The endotracheal tube tip lies 3.2 cm above the crotch of the carina. The left internal jugular venous catheter lies in the region of the junction of the right and left brachiocephalic veins. The cardiopericardial silhouette is mildly enlarged. The proximal port of the esophagogastric tube lies at the level of the GE junction. There are 4 intact sternal wires. One sternal wire is broken. The lungs are reasonably well inflated. The interstitial markings are coarse but have improved slightly since yesterday's study.  IMPRESSION: Advancement of the esophagogastric tube by approximately 10 cm is recommended to assure that the proximal port remains below the level of the GE junction. The endotracheal tube is in appropriate position. There been slight interval improvement in the appearance of the pulmonary interstitium suggesting some resolution of interstitial edema.   Electronically Signed   By: David  [place]   On: 07/16/2014 09:04     ASSESSMENT / PLAN:  PULMONARY OETT 9/14>>>9/18, 9/18>> A:  Acute respiratory failure s/p cardiopulm arrest.  Pulmonary Edema Pulmonary infiltrates R>L  > favor pulm edema but with leukocytosis PAH - noted on ECHO, PA peak 71, R/O PE Tobacco Abuse P:   Daily SBT/WUA - weans well on 5/5 but would  like to clarify re-intubation status from family on 9/21 before extubating Titrate O2 for sats Wean steroids (? If started for bronchospasm), quick taper in place Assess LE dopplers for DVT, ? If this was a PE as source of arrest.  Holding CTA due to AKI.  D-Dimer 1.48 Ct  empiric heparin gtt for PE, no lovenox with renal hx  CARDIOVASCULAR CVL A:  VF/VT arrest. 10-15 minutes to ROSC  Circulatory shock - ? Sepsis vs cardiogenic  Pulmonary Edema  R/O PE P:  D/ced levaquin given concern for qtc changes Hold antihypertensives   RENAL A:   Acute on chronic renal failure (baseline scr 1.5-2.0) Hyperkalemia > improving  Hypernatremia P:   BMET in AM Lasix 40 daily -watch Na & BUN Free water 200 ml Q8 Once Na starts to normalize will consider a standing dose of lasix to go home on Replace electrolytes as indicated.  GASTROINTESTINAL A:   Obesity  P:   Restarted TF 9/19 PPI   HEMATOLOGIC A:   Anemia  R/O PE P:  Trend CBC and coags  [place] heparin  Continue heparin gtt until LE dopplers returned  INFECTIOUS A:   Severe CAP? P:   BCx2 9/14>>>ng Abx: levaquin, start date 9/14>>>9/14 ABX: doxy 9/14>>>9/18 ABX rocephin 9/14>>>9/21 (stop date in place)  Monitor fever curve / leukocytosis   ENDOCRINE A:   DM - on 65 bid lantus at home P:   SSI protocol, insulin gtt  -Once CBGs controlled, suggest give high dose lantus & transition to SSI  NEUROLOGIC A:   Acute encephalopathy s/p cardiac arrest > improving P:   Fentanyl gtt Haldol if agitated, caution with QT  TODAY'S SUMMARY:  Continue wean,  fentanyl gtt. Will need family meeting once sister arrives on 9/21 to clarify one -way extubation, unclear who is POA here.    Canary Brim, NP-C [facility] Pgr: [phone] or [phone]    I have personally obtained a history, examined the patient, evaluated laboratory and imaging results, formulated the assessment and plan and placed  orders.  CRITICAL CARE: The patient is critically ill with multiple organ systems failure and requires high complexity decision making for assessment and support, frequent evaluation and titration of therapies, application of advanced monitoring technologies and extensive interpretation of multiple databases. Critical Care Time devoted to patient care services described in this note is 35 minutes.   Oretha Milch MD  07/17/2014, 7:53 AM

## 2014-07-17 NOTE — Progress Notes (Signed)
ANTICOAGULATION CONSULT NOTE - Initial Consult  Pharmacy Consult for Heparin Indication: R/o PE  No Known Allergies  Patient Measurements: Height:  (160 cm) Weight: 211 lb 3.2 oz (95.8 kg) IBW/kg (Calculated) : 56.9 Heparin Dosing Weight: 78.6 kg  Vital Signs: Temp: 97.3 F (36.3 C) (09/20 0728) Temp src: Oral (09/20 0728) BP: 157/66 mmHg (09/20 0335) Pulse Rate: 62 (09/20 0728)  Labs:  Recent Labs  07/15/14 0415 07/16/14 0422 07/16/14 1708 07/17/14 0500  HGB 12.4* 11.9*  --  11.8*  HCT 38.8* 36.9*  --  37.6*  PLT 300 330  --  355  HEPARINUNFRC  --   --  0.54 0.64  CREATININE 1.37* 1.47*  --  1.38*    Estimated Creatinine Clearance: 62.8 ml/min (by C-G formula based on Cr of 1.38).   Medical History: Past Medical History  Diagnosis Date  . CAD (coronary artery disease) of artery bypass graft 07/11/2014  . Cardiac arrest 07/11/2014  . CHF (congestive heart failure) 07/11/2014  . HTN (hypertension) 07/11/2014  . COPD (chronic obstructive pulmonary disease) 07/11/2014  . DM (diabetes mellitus) 07/11/2014  . GERD (gastroesophageal reflux disease) 07/11/2014  . Acute on chronic kidney failure 07/11/2014    Medications:  Scheduled:  . albuterol  2.5 mg Nebulization Q6H  . antiseptic oral rinse  7 mL Mouth Rinse QID  . atorvastatin  80 mg Per Tube q1800  . cefTRIAXone (ROCEPHIN)  IV  1 g Intravenous Q24H  . chlorhexidine  15 mL Mouth Rinse BID  . feeding supplement (PRO-STAT SUGAR FREE 64)  60 mL Oral BID  . feeding supplement (VITAL HIGH PROTEIN)  1,000 mL Per Tube Q24H  . free water  200 mL Per Tube 3 times per day  . [START ON 07/18/2014] methylPREDNISolone (SOLU-MEDROL) injection  20 mg Intravenous Daily  . pantoprazole sodium  40 mg Per Tube Daily   Infusions:  . sodium chloride 15 mL/hr at 07/16/14 1930  . fentaNYL infusion INTRAVENOUS 50 mcg/hr (07/17/14 0737)  . heparin 1,200 Units/hr (07/16/14 1930)  . insulin (NOVOLIN-R) infusion 7.2 Units/hr  (07/17/14 0736)    Assessment: 53 yo m transferred from [facility] on 9/14 for weakness, AMS, possible CAP and hyperkalemia. Pt is s/p VT/VF cardiac arrest in echo lab on 9/14 with ROSC in 16 mins. Hypothermia protocol was initiated, but cancelled d/t pt regaining consciousness. Pharmacy is now consulted to begin heparin for r/o PE. HL remains therapeutic x 2. H/H slightly low but stable, plt wnl and stable with no reported s/s bleeding.    Goal of Therapy:  Heparin level 0.3-0.7 units/ml Monitor platelets by anticoagulation protocol: Yes   Plan:  - Continue heparin infusion at 1200 units/hr - Check anti-Xa level daily while on heparin - Continue to monitor H&H and platelets  Margie Billet, PharmD Clinical Pharmacist - Resident Pager: [phone] Pharmacy: [phone] 07/17/2014 8:07 AM

## 2014-07-18 ENCOUNTER — Inpatient Hospital Stay (HOSPITAL_COMMUNITY): Payer: Medicaid Other

## 2014-07-18 DIAGNOSIS — I5031 Acute diastolic (congestive) heart failure: Secondary | ICD-10-CM

## 2014-07-18 LAB — GLUCOSE, CAPILLARY
GLUCOSE-CAPILLARY: 143 mg/dL — AB (ref 70–99)
GLUCOSE-CAPILLARY: 139 mg/dL — AB (ref 70–99)
GLUCOSE-CAPILLARY: 159 mg/dL — AB (ref 70–99)
GLUCOSE-CAPILLARY: 191 mg/dL — AB (ref 70–99)
GLUCOSE-CAPILLARY: 303 mg/dL — AB (ref 70–99)
GLUCOSE-CAPILLARY: 355 mg/dL — AB (ref 70–99)
Glucose-Capillary: 128 mg/dL — ABNORMAL HIGH (ref 70–99)
Glucose-Capillary: 137 mg/dL — ABNORMAL HIGH (ref 70–99)
Glucose-Capillary: 163 mg/dL — ABNORMAL HIGH (ref 70–99)
Glucose-Capillary: 164 mg/dL — ABNORMAL HIGH (ref 70–99)
Glucose-Capillary: 174 mg/dL — ABNORMAL HIGH (ref 70–99)
Glucose-Capillary: 190 mg/dL — ABNORMAL HIGH (ref 70–99)
Glucose-Capillary: 128 mg/dL — ABNORMAL HIGH (ref 70–99)
Glucose-Capillary: 149 mg/dL — ABNORMAL HIGH (ref 70–99)
Glucose-Capillary: 61 mg/dL — ABNORMAL LOW (ref 70–99)
Glucose-Capillary: 207 mg/dL — ABNORMAL HIGH (ref 70–99)
Glucose-Capillary: 171 mg/dL — ABNORMAL HIGH (ref 70–99)

## 2014-07-18 LAB — HEPARIN LEVEL (UNFRACTIONATED): HEPARIN UNFRACTIONATED: 0.4 [IU]/mL (ref 0.30–0.70)

## 2014-07-18 LAB — BASIC METABOLIC PANEL
Anion gap: 10 (ref 5–15)
BUN: 71 mg/dL — ABNORMAL HIGH (ref 6–23)
CO2: 39 mEq/L — ABNORMAL HIGH (ref 19–32)
CREATININE: 1.08 mg/dL (ref 0.50–1.35)
Calcium: 9.3 mg/dL (ref 8.4–10.5)
Chloride: 106 mEq/L (ref 96–112)
GFR calc Af Amer: 88 mL/min — ABNORMAL LOW (ref >90)
GFR, EST NON AFRICAN AMERICAN: 76 mL/min — AB (ref >90)
Glucose, Bld: 161 mg/dL — ABNORMAL HIGH (ref 70–99)
Potassium: 3.9 mEq/L (ref 3.7–5.3)
SODIUM: 155 meq/L — AB (ref 137–147)

## 2014-07-18 LAB — CBC
HCT: 39.8 % (ref 39.0–52.0)
Hemoglobin: 12.2 g/dL — ABNORMAL LOW (ref 13.0–17.0)
MCH: 27.7 pg (ref 26.0–34.0)
MCHC: 30.7 g/dL (ref 30.0–36.0)
MCV: 90.2 fL (ref 78.0–100.0)
PLATELETS: 364 10*3/uL (ref 150–400)
RBC: 4.41 MIL/uL (ref 4.22–5.81)
RDW: 16.9 % — AB (ref 11.5–15.5)
WBC: 11.3 10*3/uL — AB (ref 4.0–10.5)

## 2014-07-18 MED ORDER — FREE WATER
300.0000 mL | Freq: Four times a day (QID) | Status: DC
  Administered 2014-07-18 – 2014-07-22 (×12): 300 mL

## 2014-07-18 MED ORDER — FUROSEMIDE 10 MG/ML IJ SOLN
20.0000 mg | Freq: Every day | INTRAMUSCULAR | Status: DC
  Administered 2014-07-19 – 2014-07-26 (×8): 20 mg via INTRAVENOUS
  Filled 2014-07-18 (×10): qty 2

## 2014-07-18 MED ORDER — SODIUM CHLORIDE 0.9 % IJ SOLN: 3.0000 mL | INTRAMUSCULAR | Status: DC | PRN

## 2014-07-18 MED ORDER — SODIUM CHLORIDE 0.9 % IV SOLN
INTRAVENOUS | Status: DC
Start: 2014-07-19 — End: 2014-07-19
  Administered 2014-07-19: 05:00:00 via INTRAVENOUS

## 2014-07-18 MED ORDER — INSULIN ASPART 100 UNIT/ML ~~LOC~~ SOLN: 1.0000 [IU] | SUBCUTANEOUS | Status: DC

## 2014-07-18 MED ORDER — INSULIN GLARGINE 100 UNIT/ML ~~LOC~~ SOLN
40.0000 [IU] | Freq: Two times a day (BID) | SUBCUTANEOUS | Status: DC
  Administered 2014-07-18 – 2014-07-20 (×5): 40 [IU] via SUBCUTANEOUS
  Filled 2014-07-18 (×6): qty 0.4

## 2014-07-18 MED ORDER — SODIUM CHLORIDE 0.9 % IJ SOLN
3.0000 mL | Freq: Two times a day (BID) | INTRAMUSCULAR | Status: DC
  Administered 2014-07-19: 3 mL via INTRAVENOUS

## 2014-07-18 MED ORDER — LABETALOL HCL 5 MG/ML IV SOLN
10.0000 mg | INTRAVENOUS | Status: DC | PRN
  Administered 2014-07-18: 10 mg via INTRAVENOUS
  Filled 2014-07-18: qty 4

## 2014-07-18 MED ORDER — HYDRALAZINE HCL 10 MG PO TABS
10.0000 mg | ORAL_TABLET | Freq: Three times a day (TID) | ORAL | Status: DC
  Administered 2014-07-18 – 2014-07-22 (×12): 10 mg via ORAL
  Filled 2014-07-18 (×16): qty 1

## 2014-07-18 MED ORDER — INSULIN ASPART 100 UNIT/ML ~~LOC~~ SOLN
0.0000 [IU] | SUBCUTANEOUS | Status: DC
  Administered 2014-07-18: 20 [IU] via SUBCUTANEOUS
  Administered 2014-07-18: 15 [IU] via SUBCUTANEOUS
  Administered 2014-07-19: 4 [IU] via SUBCUTANEOUS
  Administered 2014-07-19 (×2): 7 [IU] via SUBCUTANEOUS
  Administered 2014-07-19 (×2): 15 [IU] via SUBCUTANEOUS
  Administered 2014-07-20: 4 [IU] via SUBCUTANEOUS
  Administered 2014-07-20: 11 [IU] via SUBCUTANEOUS
  Administered 2014-07-20 – 2014-07-21 (×3): 4 [IU] via SUBCUTANEOUS
  Administered 2014-07-21: 7 [IU] via SUBCUTANEOUS
  Administered 2014-07-21: 4 [IU] via SUBCUTANEOUS
  Administered 2014-07-21 (×2): 3 [IU] via SUBCUTANEOUS
  Administered 2014-07-21 – 2014-07-22 (×2): 4 [IU] via SUBCUTANEOUS
  Administered 2014-07-22: 7 [IU] via SUBCUTANEOUS
  Administered 2014-07-22: 4 [IU] via SUBCUTANEOUS
  Administered 2014-07-23: 20 [IU] via SUBCUTANEOUS
  Administered 2014-07-23 (×2): 3 [IU] via SUBCUTANEOUS
  Administered 2014-07-23 – 2014-07-24 (×2): 20 [IU] via SUBCUTANEOUS
  Administered 2014-07-24: 15 [IU] via SUBCUTANEOUS
  Administered 2014-07-24: 3 [IU] via SUBCUTANEOUS
  Administered 2014-07-24 (×2): 7 [IU] via SUBCUTANEOUS
  Administered 2014-07-24: 11 [IU] via SUBCUTANEOUS
  Administered 2014-07-25: 3 [IU] via SUBCUTANEOUS
  Administered 2014-07-25 (×2): 7 [IU] via SUBCUTANEOUS
  Administered 2014-07-25: 15 [IU] via SUBCUTANEOUS
  Administered 2014-07-25: 7 [IU] via SUBCUTANEOUS
  Administered 2014-07-25: 3 [IU] via SUBCUTANEOUS
  Administered 2014-07-26: 4 [IU] via SUBCUTANEOUS
  Administered 2014-07-26: 11 [IU] via SUBCUTANEOUS
  Administered 2014-07-26: 3 [IU] via SUBCUTANEOUS
  Administered 2014-07-26: 4 [IU] via SUBCUTANEOUS
  Administered 2014-07-26: 3 [IU] via SUBCUTANEOUS

## 2014-07-18 MED ORDER — SODIUM CHLORIDE 0.9 % IV SOLN: 250.0000 mL | INTRAVENOUS | Status: DC | PRN

## 2014-07-18 MED ORDER — ASPIRIN 81 MG PO CHEW
81.0000 mg | CHEWABLE_TABLET | ORAL | Status: AC
  Administered 2014-07-19: 81 mg via ORAL
  Filled 2014-07-18: qty 1

## 2014-07-18 NOTE — Evaluation (Signed)
Physical Therapy Evaluation Patient Details Name: Richard [name] MRN: [id number] DOB: 13-Jan-1961 Today's Date: 07/18/2014   History of Present Illness  53 y.o. male transferred from [facility] 07/11/14 (presented to hospital with hyperkalemia and altered mental status). 07/11/14 cardiac arrest and intubated; extubated and reintubated 07/15/14. Acute on chronic renal failure. ?PE and started on Heparin 07/16/14. Dopplers of bil LEs negative for DVT. 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 CABG), GERD, COPD, diabetes, chronic kidney disease  Clinical Impression  Pt admitted with AMS, renal failure, and s/p cardiac arrest with intubation. Activity currently limited by incr BP with minimal activity. Pt currently with functional limitations due to the deficits listed below (see PT Problem List).  Pt will benefit from skilled PT to increase their independence and safety with mobility to assist with discharge planning from a mobility standpoint.        Follow Up Recommendations Other (comment) (TBD)    Equipment Recommendations   (TBD)    Recommendations for Other Services OT consult     Precautions / Restrictions Precautions Precautions: Fall Precaution Comments: h/o falls PTA per chart Restrictions Weight Bearing Restrictions: No      Mobility  Bed Mobility                  Transfers                    Ambulation/Gait                Stairs            Wheelchair Mobility    Modified Rankin (Stroke Patients Only)       Balance                                             Pertinent Vitals/Pain On Vent: CPAP/PS FiO2 50%, RR 15-25 HR 77-84 BP 164/70 incr to 194/88 with resisted LE exercises   Pain Assessment: Faces Faces Pain Scale: Hurts little more Pain Location: low back Pain Descriptors / Indicators:  (pt unable to verbalize;points to location) Pain Intervention(s): Limited activity within  patient's tolerance;Monitored during session;Repositioned    Home Living Family/patient expects to be discharged to:: Unsure                 Additional Comments: no family present    Prior Function Level of Independence: Independent with assistive device(s)         Comments: pt indicates (via yes/no questions) he used a cane PTA due to back pain; had 2 falls PTA per chart; indicates he does not live alone     Hand Dominance   Dominant Hand: Right    Extremity/Trunk Assessment   Upper Extremity Assessment: Overall WFL for tasks assessed           Lower Extremity Assessment: Overall WFL for tasks assessed (WFL and noted Rt ankle DF -20)         Communication   Communication: Expressive difficulties (intubated)  Cognition Arousal/Alertness: Awake/alert Behavior During Therapy: WFL for tasks assessed/performed Overall Cognitive Status: Difficult to assess                      General Comments      Exercises General Exercises - Lower Extremity Ankle Circles/Pumps: AROM;Both;10 reps;Limitations Ankle Circles/Pumps Limitations: Rt ankle limited AROM DF to -20; performed  passive stretch x 2 minutes and achieved neutral DF Heel Slides: AROM;Strengthening;Both;5 reps (resisted extension)      Assessment/Plan    PT Assessment Patient needs continued PT services  PT Diagnosis Difficulty walking   PT Problem List Decreased activity tolerance;Decreased balance;Decreased mobility;Decreased knowledge of use of DME;Cardiopulmonary status limiting activity  PT Treatment Interventions DME instruction;Gait training;Functional mobility training;Therapeutic activities;Therapeutic exercise;Balance training;Patient/family education   PT Goals (Current goals can be found in the Care Plan section) Acute Rehab PT Goals Patient Stated Goal: unable to state; nods he wants to get movign more PT Goal Formulation: Patient unable to participate in goal setting Time For  Goal Achievement: 08-02-2014 Potential to Achieve Goals: Good    Frequency Min 3X/week   Barriers to discharge Other (comment) (unknown caregiver support)      Co-evaluation               End of Session Equipment Utilized During Treatment: Oxygen Activity Tolerance: Treatment limited secondary to medical complications (Comment) (incr BP) Patient left: in bed;with nursing/sitter in room           Time: 0901-0910 PT Time Calculation (min): 9 min   Charges:   PT Evaluation $Initial PT Evaluation Tier I: 1 Procedure     PT G Codes:          Richard [name] 2014-08-02, 9:30 AM Pager [phone]

## 2014-07-18 NOTE — Progress Notes (Signed)
ANTICOAGULATION CONSULT NOTE - Follow Up Consult  Pharmacy Consult for heparin Indication: r/o PE  No Known Allergies  Patient Measurements: Height:  (160 cm) Weight: 208 lb 5.4 oz (94.5 kg) IBW/kg (Calculated) : 56.9 Heparin Dosing Weight: 78.6 kg  Vital Signs: Temp: 98.5 F (36.9 C) (09/21 1200) Temp src: Oral (09/21 1200) BP: 191/74 mmHg (09/21 0844) Pulse Rate: 88 (09/21 1200)  Labs:  Recent Labs  07/16/14 0422 07/16/14 1708 07/17/14 0500 07/18/14 0413  HGB 11.9*  --  11.8* 12.2*  HCT 36.9*  --  37.6* 39.8  PLT 330  --  355 364  HEPARINUNFRC  --  0.54 0.64 0.40  CREATININE 1.47*  --  1.38* 1.08    Estimated Creatinine Clearance: 79.5 ml/min (by C-G formula based on Cr of 1.08).   Medications:  Scheduled:  . albuterol  2.5 mg Nebulization Q6H  . antiseptic oral rinse  7 mL Mouth Rinse QID  . atorvastatin  80 mg Per Tube q1800  . chlorhexidine  15 mL Mouth Rinse BID  . feeding supplement (PRO-STAT SUGAR FREE 64)  60 mL Oral BID  . feeding supplement (VITAL HIGH PROTEIN)  1,000 mL Per Tube Q24H  . free water  300 mL Per Tube Q6H  . [START ON 07/19/2014] furosemide  20 mg Intravenous Daily  . hydrALAZINE  10 mg Oral 3 times per day  . insulin aspart  1-3 Units Subcutaneous 6 times per day  . insulin glargine  40 Units Subcutaneous BID  . methylPREDNISolone (SOLU-MEDROL) injection  20 mg Intravenous Daily  . pantoprazole sodium  40 mg Per Tube Daily   Infusions:  . sodium chloride 15 mL/hr (07/17/14 1229)  . fentaNYL infusion INTRAVENOUS 75 mcg/hr (07/18/14 0800)  . heparin 1,200 Units/hr (07/18/14 0800)    Assessment: 53 yo m transferred from [facility] 9/14 for weakness, AMS, possible CAP and hyperkalemia.  Pt is s/p VT/VF cardiac arrest in echo lab 9/14 with 2 shocks and 3 doses of epi, bicarb, cal, amio.  ROSC 16 minutes. Hypothermia protocol cancelled d/t patient regaining consciousness. Patient is on heparin for rule out PE.  HL remains  therapeutic this AM at 0.40 on 1200 units/hr.  Will continue current rate of 1200 units/hr and monitor daily HL with AM labs. Hgb stable 12.2, plts 364, no s/s of bleeding. Patient for possible cath tomorrow, will follow-up.  Goal of Therapy:  Heparin level 0.3-0.7 units/ml Monitor platelets by anticoagulation protocol: Yes   Plan:  Continue heparin infusion at 1200 units/hr Monitor daily HL with AM labs Monitor hgb/plts, s/s of bleeding, clinical course, plans for cath  Perle Brickhouse L. Roseanne Reno, PharmD Clinical Pharmacy Resident Pager: [phone] 07/18/2014 12:18 PM

## 2014-07-18 NOTE — Progress Notes (Signed)
PULMONARY / CRITICAL CARE MEDICINE   Name: Richard [name] MRN: [id number] DOB: 10-30-1960    ADMISSION DATE:  07/11/2014 CONSULTATION DATE: 9/14  REFERRING MD :  Benjamine Mola  CHIEF COMPLAINT:  Cardiac arrest.   INITIAL PRESENTATION:  53 year old male transferred to [facility] from [place] on 9/14 w/ working dx of acute on chronic renal failure, hyperkalemia, encephalopathy and possible CAP. He was in ECHO when suddenly developed VF/VT arrest. ROSC estimated at 10-15 minutes. PCCM asked to assist w/ care s/p arrest.   STUDIES:  9/14 ECHO limited >> stopped due to cardiac arrest 9/15 ECHO >> EF 65-70%, no rwma, RV mildly dilated, PA peak 71 9/14 renal US >> no hydro, 5 mm non-obs renal calculi in R lower pole, small ascites in RUQ 9/19 LE Doppler >>  SIGNIFICANT EVENTS: 9/14  Admitted w/ acute on chronic renal failure, hyperkalemia and encephalopathy  9/14  Cardiac arrest (VF/VT) while in echo 9/18  Failed extubation with immediate need for re-intubation  SUBJECTIVE: patient doing well this morning, RT reports that he does not have a cuff leak.  Per notes, cards planning for cath tomorrow, given he elevated PA pressures on ECHO.   VITAL SIGNS: Temp:  [98.2 F (36.8 C)-98.5 F (36.9 C)] 98.5 F (36.9 C) (09/21 0700) Pulse Rate:  [65-85] 81 (09/21 1000) Resp:  [12-24] 13 (09/21 1000) BP: (149-191)/(60-74) 191/74 mmHg (09/21 0844) SpO2:  [94 %-100 %] 99 % (09/21 1100) Arterial Line BP: (139-185)/(54-75) 162/69 mmHg (09/21 1000) FiO2 (%):  [40 %-50 %] 40 % (09/21 1100) Weight:  [208 lb 5.4 oz (94.5 kg)] 208 lb 5.4 oz (94.5 kg) (09/21 0500)  HEMODYNAMICS:    VENTILATOR SETTINGS: Vent Mode:  [-] PSV FiO2 (%):  [40 %-50 %] 40 % Set Rate:  [20 bmp] 20 bmp Vt Set:  [460 mL] 460 mL PEEP:  [5 cmH20] 5 cmH20 Pressure Support:  [5 cmH20] 5 cmH20 Plateau Pressure:  [18 cmH20-21 cmH20] 19 cmH20  INTAKE / OUTPUT:  Intake/Output Summary (Last 24 hours) at 07/18/14 1111 Last data filed at  07/18/14 1000  Gross per 24 hour  Intake 2724.87 ml  Output   4750 ml  Net -2025.13 ml   PHYSICAL EXAMINATION: General: Obese male, in NAD on vent Neuro: awake, alert, follows commands, nods appropriately HEENT: Orally intubated. No clear JVD Cardiovascular: Distant, RRR, Nl S1/S2, -M/R/G. Lungs: Coarse BS diffusely. Abdomen: Soft, non-tender. + bowel sounds obese Musculoskeletal:  Intact  Skin:  LE edema improving   LABS:  CBC  Recent Labs Lab 07/16/14 0422 07/17/14 0500 07/18/14 0413  WBC 10.1 10.0 11.3*  HGB 11.9* 11.8* 12.2*  HCT 36.9* 37.6* 39.8  PLT 330 355 364     Coag's No results found for this basename: APTT, INR,  in the last 168 hours BMET  Recent Labs Lab 07/16/14 0422 07/17/14 0500 07/18/14 0413  NA 151* 149* 155*  K 4.3 3.7 3.9  CL 100 101 106  CO2 36* 35* 39*  BUN 83* 87* 71*  CREATININE 1.47* 1.38* 1.08  GLUCOSE 166* 193* 161*   Electrolytes  Recent Labs Lab 07/14/14 0440 07/15/14 0415 07/15/14 1200 07/16/14 0422 07/17/14 0500 07/18/14 0413  CALCIUM 8.4 9.1  --  9.1 8.9 9.3  MG 1.3* 1.3* 2.0 2.4  --   --   PHOS 3.8 3.7  --  2.8  --   --    Sepsis Markers  Recent Labs Lab 07/11/14 1200 07/12/14 0400 07/13/14 0435  PROCALCITON 0.42  2.53 1.56   ABG   Recent Labs Lab 07/15/14 0500 07/15/14 1546 07/16/14 0414  PHART 7.437 7.344* 7.497*  PCO2ART 55.0* 77.1* 49.9*  PO2ART 77.3* 99.0 217.0*   Liver Enzymes  Recent Labs Lab 07/12/14 0400  AST 20  ALT 34  ALKPHOS 89  BILITOT 0.3  ALBUMIN 3.0*   Cardiac Enzymes  Recent Labs Lab 07/11/14 2240 07/12/14 0400 07/14/14 1014  TROPONINI 1.28* 1.14*  --   PROBNP  --   --  1548.0*   Glucose  Recent Labs Lab 07/18/14 0313 07/18/14 0415 07/18/14 0510 07/18/14 0604 07/18/14 0655 07/18/14 0742  GLUCAP 164* 163* 190* 174* 159* 191*   Imaging Dg Chest Port 1 View  07/17/2014   CLINICAL DATA:  Ventilator dependent respiratory failure. Followup basilar  atelectasis.  EXAM: PORTABLE CHEST - 1 VIEW  COMPARISON:  Portable chest x-rays yesterday dating back to 07/11/2014.  FINDINGS: Endotracheal tube tip in satisfactory position projecting approximately 4 cm above the carina. Left jugular central venous catheter tip projects over the innominate vein in, unchanged. Nasogastric tube courses below the diaphragm into the stomach.  Prior sternotomy. Cardiac silhouette mildly enlarged but stable. Mild pulmonary venous hypertension without overt edema. Improved aeration in the lung bases, with mild atelectasis persisting at the left base. No new pulmonary parenchymal abnormalities.  IMPRESSION: Support apparatus satisfactory. Improved aeration in the lung bases, with mild left basilar atelectasis persisting. Pulmonary venous hypertension without overt edema currently. No new abnormalities.   Electronically Signed   By: Hulan Saas M.D.   On: 07/17/2014 07:05     ASSESSMENT / PLAN:  PULMONARY OETT 9/14>>>9/18, 9/18>> A:  Acute respiratory failure s/p cardiopulm arrest.  Pulmonary Edema Pulmonary infiltrates R>L  > favor pulm edema but with leukocytosis PAH - noted on ECHO, PA peak 71, R/O PE Tobacco Abuse P:   Daily SBT/WUA - weans well on 5/5 but would like to clarify re-intubation status from family on 9/21 before extubating CXR today with mild worsening atelectasis, will inc PEEP to 7-8, he did not have a cuff leak today Titrate O2 for sats Wean steroids (? If started for bronchospasm), quick taper in place Assess LE dopplers for DVT, ? If this was a PE as source of arrest.  Holding CTA due to AKI.  D-Dimer 1.48 Ct  empiric heparin gtt for PE, no lovenox with renal hx.  Will plan for CT chest with contrast in a few days depending on results of the cath and following his renal fcn  CARDIOVASCULAR R CVL 9/14>>921 R radial Aline 9/14>> A:  VF/VT arrest. 10-15 minutes to ROSC  Circulatory shock - ? Sepsis vs cardiogenic  Pulmonary Edema  R/O  PE P:  D/ced levaquin given concern for qtc changes, check EKG Lisinopril restarted per cardiology Restart prn IV labetalol (discussed with cards).  CVL with 4cm migration, will dc today   RENAL A:   Acute on chronic renal failure (baseline scr 1.5-2.0) Hyperkalemia > improving Hypernatremia P:   BMET in AM Lasix reduced to  qd (9/21)-watch Na & BUN Free water inc to 300 ml Q6 (9/21) Once Na starts to normalize will consider a standing dose of lasix to go home on Replace electrolytes as indicated.  GASTROINTESTINAL A:   Obesity  P:   Restarted TF 9/19 PPI   HEMATOLOGIC A:   Anemia  R/O PE P:  Trend CBC and coags  [place] heparin  Continue heparin gtt until LE dopplers returned  INFECTIOUS A:  Severe CAP? P:   BCx2 9/14>>>ng Abx: levaquin, start date 9/14>>>9/14 ABX: doxy 9/14>>>9/18 ABX rocephin 9/14>>>9/21 (stop date in place)  Monitor fever curve / leukocytosis   ENDOCRINE A:   DM - on 65 bid lantus at home P:   SSI protocol, insulin gtt  -Once CBGs controlled, suggest give high dose lantus & transition to SSI  NEUROLOGIC A:   Acute encephalopathy s/p cardiac arrest > improving P:   Fentanyl gtt Haldol if agitated, caution with QT  TODAY'S SUMMARY:  Continue wean,  fentanyl gtt. No cuff leak, high risk for reintubation if extubated acutely.  Cards planning for cath tomorrow.  Restarted on lisinopril and PRN beta blocker today. Cont with steroids for now.    I have personally obtained a history, examined the patient, evaluated laboratory and imaging results, formulated the assessment and plan and placed orders.  CRITICAL CARE: The patient is critically ill with multiple organ systems failure and requires high complexity decision making for assessment and support, frequent evaluation and titration of therapies, application of advanced monitoring technologies and extensive interpretation of multiple databases. Critical Care Time devoted to patient care  services described in this note is 35 minutes.   Stephanie Acre, MD [facility] Pager [phone] On Call Pager [phone]

## 2014-07-18 NOTE — Progress Notes (Signed)
Family meeting Spoke with sister for [place], and family who live locally.  Patient is awake and alert. Discussed need for reintubation and possible tracheostomy. At this time the patient is a full code. Patient will consult\discuss with family about one way extubation, or possible intubation with tracheostomy\PEG placement in mind.   Stephanie Acre, MD  Pulmonary and Critical Care Pager [phone] On Call Pager [phone]

## 2014-07-18 NOTE — Progress Notes (Signed)
Left IJ CVC removed today due to malpositioning. Current venous access is a single Peripheral IV 24 gauge in the Left upper arm. Day shift RNs unable to able insert second PIV due to limited venous access. CCM planned to insert new CVC. Telephone consent obtain by day shift nurse due to pt being on a Fent gtt and on the vent. Pt is alert, follows commands, and able to nod/gesture appropriately, Heparin gtt stopped at 1845 for procedure. Renae Fickle, NP with CCM, at bedside at 2030 to place line. He discussed the procedure as inserting a big IV into the neck. The benefits he discussed include it allows [place] to draw blood without additional needle sticks and we can give multiple medications at one time. Continued bleeding and a punctured lung were the risks of the procedure he went over with the patient. During this time the patient was shaking his head. When asked if he wanted the procedure done he shook his head again indicating no. To assess his ability to refuse treatment, he was asked if it was 53. He shook his head no. He was then asked if it was 2015. He nodded his head yes. When he asked if we were in the grocery store he shook his head no, and he nodded yes to if we were in the hospital. Based on this assessment it was decided pt was aware and alert enough to decline treatment. RN re-enforced patient education to ensure patient fully understood the procedure and why it was recommended. This RN attempted to insert second PIV and was unsuccessful. Pt is scheduled for cardiac cath tomorrow AM and needs second IV.

## 2014-07-18 NOTE — Progress Notes (Signed)
eLink Physician-Brief Progress Note Patient Name: BEXLEY MCLESTER DOB: February 11, 1961 MRN: [id number]   Date of Service  07/18/2014  HPI/Events of Note  Pt refused CVL. Has two PIVs  eICU Interventions  Will make do with what we have for now. To be addressed further by Dr Providence Lanius in AM 9/22     Intervention Category Evaluation Type: Other  Billy Fischer 07/18/2014, 9:19 PM

## 2014-07-18 NOTE — Progress Notes (Signed)
eLink Physician-Brief Progress Note Patient Name: Richard [name] DOB: [birth year] MRN: [id number]   Date of Service  07/18/2014  HPI/Events of Note  Refractory hyperglycemia  eICU Interventions  Change from sens to resistant scale     Intervention Category Major Interventions: Hyperglycemia - active titration of insulin therapy  Billy Fischer 07/18/2014, 4:17 PM

## 2014-07-18 NOTE — Progress Notes (Signed)
PROGRESS NOTE  Subjective:   53 yo with hx  Of CAD / CABG, DM, COPD, CHF Admitted with altered mental status and hyperkalemia . He had a VF/VT cardiac arrest while in the echo lab.   Spoken with several members of the code team who were present. He was asystolic at least some part of the time. ROSC estimated at 10-15 minutes.    Has improved significantly. Echo from 9/15 shows normal LV function - EF of 65-70%. , mild-mod TR, severe pulmonary HTN with PA pressure of 71 mm Hg.   He failed extubation - he seems awake and alert. -He was able to answer questions with nodding and shaking of his head. I see that he's also been running some notes.  Objective:    Vital Signs:   Temp:  [98.2 F (36.8 C)-98.5 F (36.9 C)] 98.5 F (36.9 C) (09/21 0700) Pulse Rate:  [65-81] 76 (09/21 0800) Resp:  [11-24] 20 (09/21 0800) BP: (149-176)/(60-73) 176/73 mmHg (09/21 0700) SpO2:  [93 %-100 %] 97 % (09/21 0800) Arterial Line BP: (139-179)/(53-74) 165/72 mmHg (09/21 0800) FiO2 (%):  [40 %-50 %] 50 % (09/21 0400) Weight:  [208 lb 5.4 oz (94.5 kg)] 208 lb 5.4 oz (94.5 kg) (09/21 0500)  Last BM Date: 07/11/14   24-hour weight change: Weight change: -2 lb 13.9 oz (-1.3 kg)  Weight trends: Filed Weights   07/16/14 0436 07/17/14 0404 07/18/14 0500  Weight: 211 lb 10.3 oz (96 kg) 211 lb 3.2 oz (95.8 kg) 208 lb 5.4 oz (94.5 kg)    Intake/Output:  09/20 0701 - 09/21 0700 In: 2741.7 [I.V.:931.7; NG/GT:1760; IV Piggyback:50] Out: 3800 [Urine:3800] Total I/O In: 83.5 [I.V.:43.5; NG/GT:40] Out: 200 [Urine:200]   Physical Exam: BP 176/73  Pulse 76  Temp(Src) 98.5 F (36.9 C) (Oral)  Resp 20  Ht  (1.6 m)  Wt 208 lb 5.4 oz (94.5 kg)  BMI 36.91 kg/m2  SpO2 97%  Wt Readings from Last 3 Encounters:  07/18/14 208 lb 5.4 oz (94.5 kg)    General: Vital signs reviewed and noted. Awake and alert   Head: Normocephalic, atraumatic.  Eyes: conjunctivae/corneas clear.  EOM's intact.     Throat: Intubated   Neck:   Thick , left IJ in place  Lungs:    clear, on vent  Heart:  RR,   Abdomen:  Soft, non-tender, obese  Extremities: Left arm is diffusely edematous    Neurologic: Sedated   Psych: Sedated     Labs: BMET:  Recent Labs  07/15/14 1200  07/16/14 0422 07/17/14 0500 07/18/14 0413  NA  --   < > 151* 149* 155*  K  --   < > 4.3 3.7 3.9  CL  --   < > 100 101 106  CO2  --   < > 36* 35* 39*  GLUCOSE  --   < > 166* 193* 161*  BUN  --   < > 83* 87* 71*  CREATININE  --   < > 1.47* 1.38* 1.08  CALCIUM  --   < > 9.1 8.9 9.3  MG 2.0  --  2.4  --   --   PHOS  --   --  2.8  --   --   < > = values in this interval not displayed.  Liver function tests: No results found for this basename: AST, ALT, ALKPHOS, BILITOT, PROT, ALBUMIN,  in the last 72 hours No results found for this  basename: LIPASE, AMYLASE,  in the last 72 hours  CBC:  Recent Labs  07/17/14 0500 07/18/14 0413  WBC 10.0 11.3*  HGB 11.8* 12.2*  HCT 37.6* 39.8  MCV 90.0 90.2  PLT 355 364    Cardiac Enzymes: No results found for this basename: CKTOTAL, CKMB, TROPONINI,  in the last 72 hours  Coagulation Studies: No results found for this basename: LABPROT, INR,  in the last 72 hours   Other results:  EKG :  NSR with RBBB.   Medications:    Infusions: . sodium chloride 15 mL/hr (07/17/14 1229)  . fentaNYL infusion INTRAVENOUS 75 mcg/hr (07/17/14 2100)  . heparin 1,200 Units/hr (07/17/14 0944)  . insulin (NOVOLIN-R) infusion 5.2 Units/hr (07/18/14 0800)    Scheduled Medications: . albuterol  2.5 mg Nebulization Q6H  . antiseptic oral rinse  7 mL Mouth Rinse QID  . atorvastatin  80 mg Per Tube q1800  . cefTRIAXone (ROCEPHIN)  IV  1 g Intravenous Q24H  . chlorhexidine  15 mL Mouth Rinse BID  . feeding supplement (PRO-STAT SUGAR FREE 64)  60 mL Oral BID  . feeding supplement (VITAL HIGH PROTEIN)  1,000 mL Per Tube Q24H  . free water  200 mL Per Tube 3 times per day  . furosemide   40 mg Intravenous Daily  . methylPREDNISolone (SOLU-MEDROL) injection  20 mg Intravenous Daily  . pantoprazole sodium  40 mg Per Tube Daily    Assessment/ Plan:   Principal Problem:   Cardiac arrest Active Problems:   Acute on chronic kidney failure   Hyperkalemia   CHF (congestive heart failure)   DM (diabetes mellitus)   CAD (coronary artery disease) of artery bypass graft   HTN (hypertension)   COPD (chronic obstructive pulmonary disease)   GERD (gastroesophageal reflux disease)   Acute encephalopathy   CAP (community acquired pneumonia)  1. Cardiorespiratory arrest:  Patient was reported as having VT while in the echo lab.  He had a trivial Troponin bump ( not c/w an MI) and has normal LV function.  It's possible that his arrest was due to something else.   The echo done the following day shows severe pulmonary hypertension and RV failure.   I think it will be important to evaluate him for PE.  He has a RBBB at baseline and an episode of sinus tach or paroxysmal atrial fib would look similar to VT. His d-dimer was minimally elevated.  He may need further evaluation to look for PE.   2. CAD:  Hx of CABG in the  Past.  He may ultimately need a right and left heart cardiac catheterization.   We can potentially do that tomorrow.    3. CHF - his systolic function is normal    5. Acute renal insufficiency: The patient originally presented to [facility] with a creatinine of around 5.9. He had a potassium of 6.9. He was transferred to [facility].   His creatinine has improved to the point where I think we can do a cath tomorrow.       Disposition:  Length of Stay: 7  Vesta Mixer, Montez Hageman., MD, [facility] 07/18/2014, 8:28 AM Office [phone] Pager [phone]

## 2014-07-19 ENCOUNTER — Inpatient Hospital Stay (HOSPITAL_COMMUNITY): Payer: Medicaid Other

## 2014-07-19 ENCOUNTER — Encounter (HOSPITAL_COMMUNITY)
Admission: EM | Disposition: A | Discharge status: Rehabilitation Facility | Payer: Self-pay | Source: Other Acute Inpatient Hospital | Attending: Pulmonary Disease

## 2014-07-19 DIAGNOSIS — E870 Hyperosmolality and hypernatremia: Secondary | ICD-10-CM

## 2014-07-19 LAB — GLUCOSE, CAPILLARY
GLUCOSE-CAPILLARY: 189 mg/dL — AB (ref 70–99)
GLUCOSE-CAPILLARY: 322 mg/dL — AB (ref 70–99)
Glucose-Capillary: 187 mg/dL — ABNORMAL HIGH (ref 70–99)
Glucose-Capillary: 202 mg/dL — ABNORMAL HIGH (ref 70–99)
Glucose-Capillary: 86 mg/dL (ref 70–99)
Glucose-Capillary: 163 mg/dL — ABNORMAL HIGH (ref 70–99)
Glucose-Capillary: 201 mg/dL — ABNORMAL HIGH (ref 70–99)
Glucose-Capillary: 227 mg/dL — ABNORMAL HIGH (ref 70–99)
Glucose-Capillary: 306 mg/dL — ABNORMAL HIGH (ref 70–99)
Glucose-Capillary: 317 mg/dL — ABNORMAL HIGH (ref 70–99)

## 2014-07-19 LAB — BASIC METABOLIC PANEL
Anion gap: 11 (ref 5–15)
BUN: 65 mg/dL — AB (ref 6–23)
CHLORIDE: 104 meq/L (ref 96–112)
CO2: 38 mEq/L — ABNORMAL HIGH (ref 19–32)
Calcium: 9.3 mg/dL (ref 8.4–10.5)
Creatinine, Ser: 1.07 mg/dL (ref 0.50–1.35)
GFR calc Af Amer: 89 mL/min — ABNORMAL LOW (ref >90)
GFR calc non Af Amer: 77 mL/min — ABNORMAL LOW (ref >90)
GLUCOSE: 135 mg/dL — AB (ref 70–99)
Potassium: 3.7 mEq/L (ref 3.7–5.3)
Sodium: 153 mEq/L — ABNORMAL HIGH (ref 137–147)

## 2014-07-19 LAB — PROTIME-INR
INR: 1.3 (ref 0.00–1.49)
PROTHROMBIN TIME: 16.2 s — AB (ref 11.6–15.2)

## 2014-07-19 LAB — HEPARIN LEVEL (UNFRACTIONATED)
HEPARIN UNFRACTIONATED: 0.51 [IU]/mL (ref 0.30–0.70)
Heparin Unfractionated: <0.1 IU/mL — ABNORMAL LOW (ref 0.30–0.70)
Heparin Unfractionated: 0.38 IU/mL (ref 0.30–0.70)

## 2014-07-19 LAB — CBC
HCT: 40.5 % (ref 39.0–52.0)
HEMOGLOBIN: 12.4 g/dL — AB (ref 13.0–17.0)
MCH: 27.6 pg (ref 26.0–34.0)
MCHC: 30.6 g/dL (ref 30.0–36.0)
MCV: 90 fL (ref 78.0–100.0)
Platelets: 348 10*3/uL (ref 150–400)
RBC: 4.5 MIL/uL (ref 4.22–5.81)
RDW: 16.5 % — ABNORMAL HIGH (ref 11.5–15.5)
WBC: 11.4 10*3/uL — ABNORMAL HIGH (ref 4.0–10.5)

## 2014-07-19 SURGERY — LEFT AND RIGHT HEART CATHETERIZATION WITH CORONARY/GRAFT ANGIOGRAM
Anesthesia: LOCAL

## 2014-07-19 MED ORDER — INFLUENZA VAC SPLIT QUAD 0.5 ML IM SUSY: 0.5000 mL | PREFILLED_SYRINGE | INTRAMUSCULAR | Status: DC | PRN

## 2014-07-19 MED ORDER — FENTANYL CITRATE 0.05 MG/ML IJ SOLN
50.0000 ug | INTRAMUSCULAR | Status: DC | PRN
  Administered 2014-07-19 – 2014-07-20 (×3): 50 ug via INTRAVENOUS
  Filled 2014-07-19 (×3): qty 2

## 2014-07-19 NOTE — Progress Notes (Signed)
07/19/2014 2:26 PM Fentanyl 50 ml wasted in sink. [place], Linnell Fulling

## 2014-07-19 NOTE — Progress Notes (Signed)
ANTICOAGULATION CONSULT NOTE - Follow Up Consult  Pharmacy Consult for heparin Indication: r/o PE  No Known Allergies  Patient Measurements: Height:  (160 cm) Weight: 207 lb 14.3 oz (94.3 kg) IBW/kg (Calculated) : 56.9 Heparin Dosing Weight: 79 kg  Vital Signs: Temp: 97.8 F (36.6 C) (09/22 1929) Temp src: Oral (09/22 1929) BP: 126/102 mmHg (09/22 1929) Pulse Rate: 90 (09/22 1929)  Labs:  Recent Labs  07/17/14 0500 07/18/14 0413 07/19/14 0409 07/19/14 0950 07/19/14 1400 07/19/14 2006  HGB 11.8* 12.2*  --  12.4*  --   --   HCT 37.6* 39.8  --  40.5  --   --   PLT 355 364  --  348  --   --   LABPROT  --   --  16.2*  --   --   --   INR  --   --  1.30  --   --   --   HEPARINUNFRC 0.64 0.40 <0.10*  --  0.38 0.51  CREATININE 1.38* 1.08  --  1.07  --   --     Estimated Creatinine Clearance: 80.3 ml/min (by C-G formula based on Cr of 1.07).   Assessment: 53 yo m transferred from [facility] 9/14 for weakness, AMS, possible CAP and hyperkalemia. Pt is s/p VT/VF cardiac arrest in echo lab 9/14 with 2 shocks and 3 doses of epi, bicarb, cal, amio. ROSC 16 minutes. Hypothermia protocol cancelled d/t patient regaining consciousness. Patient is on heparin for rule out PE. HL this AM was subtherapeutic <0.10. Per RN, heparin was turned off for a few hours last night.  Repeat HL this afternoon was therapeutic at 0.38 on 1300 units/hr.  HL rechecked tonight 8 pm remain therapeutic at 0.51 on 1300 units/hr.  No s/s of bleeding.  Goal of Therapy:  Heparin level 0.3-0.7 units/ml Monitor platelets by anticoagulation protocol: Yes   Plan:  Continue heparin at 1300 units/hr Daily CBC and HL  Herby Abraham, Pharm.D. [phone] 07/19/2014 9:41 PM

## 2014-07-19 NOTE — Progress Notes (Signed)
Family Update Another family discussion about one extubation vs tracheostomy. At this time the patient wants another trial of extubation, which is planned for tomorrow morning (after steroids doses are completed). If extubation is unsuccessful then he will need reintubation followed by a tracheostomy - patient is unsure about this option currently, and will think about it tonight.  Consensus of the family meeting - Plan for extubation in the morning, at which point he will let us know if the extubation is unsuccessful what his decision is about tracheostomy.  Stephanie Acre, MD [place] [facility] Pager [phone] On Call Pager [phone]

## 2014-07-19 NOTE — Progress Notes (Signed)
PULMONARY / CRITICAL CARE MEDICINE   Name: Richard [name] MRN: [id number] DOB: 09-18-1961    ADMISSION DATE:  07/11/2014 CONSULTATION DATE: 9/14  REFERRING MD :  Benjamine Mola  CHIEF COMPLAINT:  Cardiac arrest.   INITIAL PRESENTATION:  53 year old male transferred to [facility] from [place] on 9/14 w/ working dx of acute on chronic renal failure, hyperkalemia, encephalopathy and possible CAP. He was in ECHO when suddenly developed VF/VT arrest. ROSC estimated at 10-15 minutes. PCCM asked to assist w/ care s/p arrest.   STUDIES:  9/14 ECHO limited >> stopped due to cardiac arrest 9/15 ECHO >> EF 65-70%, no rwma, RV mildly dilated, PA peak 71 9/14 renal US >> no hydro, 5 mm non-obs renal calculi in R lower pole, small ascites in RUQ 9/19 LE Doppler >>no DVT  SIGNIFICANT EVENTS: 9/14  Admitted w/ acute on chronic renal failure, hyperkalemia and encephalopathy  9/14  Cardiac arrest (VF/VT) while in echo 9/18  Failed extubation with immediate need for re-intubation  SUBJECTIVE: patient doing well this morning, RT reports that he does not have a cuff leak, again.  He has refused heart cath at this time.  Family meeting done yesterday, patient does not want Trach\Peg, however he is unsure if he wants to be reintubated if he fails another extubation.  VITAL SIGNS: Temp:  [97.6 F (36.4 C)-99 F (37.2 C)] 98.6 F (37 C) (09/22 0800) Pulse Rate:  [65-92] 71 (09/22 0933) Resp:  [11-27] 20 (09/22 0933) BP: (144-175)/(67-78) 175/77 mmHg (09/22 0856) SpO2:  [95 %-100 %] 100 % (09/22 0933) Arterial Line BP: (123-204)/(60-85) 164/72 mmHg (09/22 0933) FiO2 (%):  [40 %-50 %] 50 % (09/22 0900) Weight:  [207 lb 14.3 oz (94.3 kg)] 207 lb 14.3 oz (94.3 kg) (09/22 0500)  HEMODYNAMICS:    VENTILATOR SETTINGS: Vent Mode:  [-] PRVC FiO2 (%):  [40 %-50 %] 50 % Set Rate:  [20 bmp] 20 bmp Vt Set:  [460 mL] 460 mL PEEP:  [5 cmH20-8 cmH20] 5 cmH20 Pressure Support:  [5 cmH20] 5 cmH20 Plateau Pressure:  [17  cmH20-22 cmH20] 22 cmH20  INTAKE / OUTPUT:  Intake/Output Summary (Last 24 hours) at 07/19/14 0940 Last data filed at 07/19/14 0900  Gross per 24 hour  Intake   2004 ml  Output   3600 ml  Net  -1596 ml   PHYSICAL EXAMINATION: General: Obese male, in NAD on vent Neuro: awake, alert, follows commands, nods appropriately HEENT: Orally intubated. No clear JVD Cardiovascular: Distant, RRR, Nl S1/S2, -M/R/G. Lungs: Coarse BS diffusely. Abdomen: Soft, non-tender. + bowel sounds obese Musculoskeletal:  Intact  Skin:  LE edema improving   LABS:  CBC  Recent Labs Lab 07/16/14 0422 07/17/14 0500 07/18/14 0413  WBC 10.1 10.0 11.3*  HGB 11.9* 11.8* 12.2*  HCT 36.9* 37.6* 39.8  PLT 330 355 364     Coag's  Recent Labs Lab 07/19/14 0409  INR 1.30   BMET  Recent Labs Lab 07/16/14 0422 07/17/14 0500 07/18/14 0413  NA 151* 149* 155*  K 4.3 3.7 3.9  CL 100 101 106  CO2 36* 35* 39*  BUN 83* 87* 71*  CREATININE 1.47* 1.38* 1.08  GLUCOSE 166* 193* 161*   Electrolytes  Recent Labs Lab 07/14/14 0440 07/15/14 0415 07/15/14 1200 07/16/14 0422 07/17/14 0500 07/18/14 0413  CALCIUM 8.4 9.1  --  9.1 8.9 9.3  MG 1.3* 1.3* 2.0 2.4  --   --   PHOS 3.8 3.7  --  2.8  --   --  Sepsis Markers  Recent Labs Lab 07/13/14 0435  PROCALCITON 1.56   ABG   Recent Labs Lab 07/15/14 0500 07/15/14 1546 07/16/14 0414  PHART 7.437 7.344* 7.497*  PCO2ART 55.0* 77.1* 49.9*  PO2ART 77.3* 99.0 217.0*   Liver Enzymes No results found for this basename: AST, ALT, ALKPHOS, BILITOT, ALBUMIN,  in the last 168 hours Cardiac Enzymes  Recent Labs Lab 07/14/14 1014  PROBNP 1548.0*   Glucose  Recent Labs Lab 07/18/14 1327 07/18/14 1614 07/18/14 2001 07/19/14 0004 07/19/14 0404 07/19/14 0732  GLUCAP 171* 303* 355* 322* 86 163*   Imaging Dg Chest Port 1 View  07/18/2014   CLINICAL DATA:  Intubated.  Shortness of breath.  EXAM: PORTABLE CHEST - 1 VIEW  COMPARISON:   07/17/2014.  FINDINGS: Endotracheal tube in satisfactory position. Feeding tube extending into the stomach. Left jugular catheter tip in the region of the left innominate vein. Post CABG changes. Cervical spine fixation hardware. Stable enlarged cardiac silhouette. Increased prominence of the interstitial markings with interval ill-defined airspace opacities bilaterally. No significant change in bilateral pleural fluid.  IMPRESSION: 1. Mildly progressive changes of congestive heart failure with stable cardiomegaly. 2. Left jugular catheter tip projected over the region of the left innominate vein.   Electronically Signed   By: Gordan Payment M.D.   On: 07/18/2014 07:19     ASSESSMENT / PLAN:  PULMONARY OETT 9/14>>>9/18, 9/18>> A:  Acute respiratory failure s/p cardiopulm arrest.  Pulmonary Edema Pulmonary infiltrates R>L  > favor pulm edema but with leukocytosis PAH - noted on ECHO, PA peak 71, R/O PE Tobacco Abuse P:   Daily SBT/WUA - weans well on 5/5, no cuff leak,  CXR today with worsening consolidation/atelectasis of the LLL on today's CXR.  Most likely atelectasis, he does not have fever or WBC, no antibiotic at this time Titrate O2 for sats Wean steroids (? If started for bronchospasm), quick taper in place Cont  empiric heparin gtt for PE, no lovenox with renal hx.  Will plan for CT chest with contrast in a few days depending on renal fcn He has refused cardiac cath to eval his CAD and pHTN at this time.  Complete sedation holiday starting today, prn versed\fentanyl, and address reintubation status he fails extubation.  Currently he is refusing trach\peg if needed in the future.   CARDIOVASCULAR R CVL 9/14>>921 R radial Aline 9/14>> A:  VF/VT arrest. 10-15 minutes to ROSC  Circulatory shock - ? Sepsis vs cardiogenic  Pulmonary Edema  R/O PE P:  D/ced levaquin given concern for qtc changes, check EKG Lisinopril restarted per cardiology prn IV labetalol (discussed with cards).   CVL with 4cm migration, dc'ed>>9/21   RENAL A:   Acute on chronic renal failure (baseline scr 1.5-2.0) Hyperkalemia > improving Hypernatremia P:   BMET in AM Lasix reduced to  qd (9/21)-watch Na & BUN Free water inc to 300 ml Q6 (9/21) Once Na starts to normalize will consider a standing dose of lasix to go home on Replace electrolytes as indicated.  GASTROINTESTINAL A:   Obesity  P:   Restarted TF 9/19 PPI   HEMATOLOGIC A:   Anemia  R/O PE P:  Trend CBC and coags  [place] heparin  Continue heparin gtt until LE dopplers returned  INFECTIOUS A:   Severe CAP? P:   BCx2 9/14>>>ng Abx: levaquin, start date 9/14>>>9/14 ABX: doxy 9/14>>>9/18 ABX rocephin 9/14>>>9/21 (stop date in place)  Monitor fever curve / leukocytosis   ENDOCRINE A:  DM - on 65 bid lantus at home P:   SSI protocol, insulin gtt  -Once CBGs controlled, suggest give high dose lantus & transition to SSI  NEUROLOGIC A:   Acute encephalopathy s/p cardiac arrest > improving P:   Fentanyl prn Haldol if agitated, caution with QT  TODAY'S SUMMARY:  Weaning parameter good for extubation, except he does not have a cuff leak, he had acute stridor and acute reintubation during his last extubation (9/18). Currently he is unsure about reintubation if he fails another extubation attempt, and he does not want trach\peg if it is needed in the future.    I have personally obtained a history, examined the patient, evaluated laboratory and imaging results, formulated the assessment and plan and placed orders.  CRITICAL CARE: The patient is critically ill with multiple organ systems failure and requires high complexity decision making for assessment and support, frequent evaluation and titration of therapies, application of advanced monitoring technologies and extensive interpretation of multiple databases. Critical Care Time devoted to patient care services described in this note is 35 minutes.   Stephanie Acre, MD [facility] Pager [phone] On Call Pager [phone]

## 2014-07-19 NOTE — Progress Notes (Signed)
ANTICOAGULATION CONSULT NOTE - Follow Up Consult  Pharmacy Consult for Heparin  Indication: r/o PE  No Known Allergies  Patient Measurements: Heigh hpJkj$  (160 cm) Weight: 208 lb 5.4 oz (94.5 kg) IBW/kg (Calculated) : 56.9 Heparin Dosing Weight: 78.6 kg  Vital Signs: Temp: 99 F (37.2 C) (09/22 0312) Temp src: Oral (09/22 0312) BP: 144/67 mmHg (09/22 0312) Pulse Rate: 65 (09/22 0500)  Labs:  Recent Labs  07/17/14 0500 07/18/14 0413 07/19/14 0409  HGB 11.8* 12.2*  --   HCT 37.6* 39.8  --   PLT 355 364  --   LABPROT  --   --  16.2*  INR  --   --  1.30  HEPARINUNFRC 0.64 0.40 <0.10*  CREATININE 1.38* 1.08  --     Estimated Creatinine Clearance: 79.5 ml/min (by C-G formula based on Cr of 1.08).   Assessment: Sub-therapeutic heparin level this AM. Per RN, heparin was off for a few hours last night but had been restarted for ~5-6 hours when level was drawn, therefore, will only increase rate slightly.   Goal of Therapy:  Heparin level 0.3-0.7 units/ml Monitor platelets by anticoagulation protocol: Yes   Plan:  -Increase heparin to 1300 units/hr -1300 HL -Daily CBC/HL -Monitor for bleeding  Richard [name] 07/19/2014,5:21 AM

## 2014-07-19 NOTE — Progress Notes (Signed)
Pt assessed for air leak around ETT, positive cuff leak noted.

## 2014-07-19 NOTE — Progress Notes (Signed)
PROGRESS NOTE  Subjective:   53 yo with hx  Of CAD / CABG, DM, COPD, CHF Admitted with altered mental status and hyperkalemia . He had a VF/VT cardiac arrest while in the echo lab.   Spoken with several members of the code team who were present. He was asystolic at least some part of the time. ROSC estimated at 10-15 minutes.    Has improved significantly. Echo from 9/15 shows normal LV function - EF of 65-70%. , mild-mod TR, severe pulmonary HTN with PA pressure of 71 mm Hg.   He failed extubation - he seems awake and alert. -He was able to answer questions with nodding and shaking of his head. I see that he's also been writing some notes.  He refuses cath this am.  He is awake and alert .  Objective:    Vital Signs:   Temp:  [97.6 F (36.4 C)-99 F (37.2 C)] 99 F (37.2 C) (09/22 0312) Pulse Rate:  [65-92] 67 (09/22 0700) Resp:  [11-27] 20 (09/22 0700) BP: (144-191)/(67-78) 144/67 mmHg (09/22 0312) SpO2:  [95 %-100 %] 100 % (09/22 0700) Arterial Line BP: (123-204)/(60-85) 135/60 mmHg (09/22 0700) FiO2 (%):  [40 %-50 %] 50 % (09/22 0555) Weight:  [207 lb 14.3 oz (94.3 kg)] 207 lb 14.3 oz (94.3 kg) (09/22 0500)  Last BM Date: 07/11/14   24-hour weight change: Weight change: -7.1 oz (-0.2 kg)  Weight trends: Filed Weights   07/17/14 0404 07/18/14 0500 07/19/14 0500  Weight: 211 lb 3.2 oz (95.8 kg) 208 lb 5.4 oz (94.5 kg) 207 lb 14.3 oz (94.3 kg)    Intake/Output:  09/21 0701 - 09/22 0700 In: 1990.6 [I.V.:580.6; NG/GT:1360; IV Piggyback:50] Out: 3650 [Urine:3650] Total I/O In: -  Out: 150 [Urine:150]   Physical Exam: BP 144/67  Pulse 67  Temp(Src) 99 F (37.2 C) (Oral)  Resp 20  Ht  (1.6 m)  Wt 207 lb 14.3 oz (94.3 kg)  BMI 36.84 kg/m2  SpO2 100%  Wt Readings from Last 3 Encounters:  07/19/14 207 lb 14.3 oz (94.3 kg)  07/19/14 207 lb 14.3 oz (94.3 kg)    General: Vital signs reviewed and noted. Awake and alert   Head: Normocephalic,  atraumatic.  Eyes: conjunctivae/corneas clear.  EOM's intact.   Throat: Intubated   Neck:   Thick , left IJ in place  Lungs:      on vent  Heart:  RR,   Abdomen:  Soft, non-tender, obese  Extremities: Left arm is diffusely edematous    Neurologic: Sleepy  But can answer questions  Psych: Seems appropriate    Labs: BMET:  Recent Labs  07/17/14 0500 07/18/14 0413  NA 149* 155*  K 3.7 3.9  CL 101 106  CO2 35* 39*  GLUCOSE 193* 161*  BUN 87* 71*  CREATININE 1.38* 1.08  CALCIUM 8.9 9.3    Liver function tests: No results found for this basename: AST, ALT, ALKPHOS, BILITOT, PROT, ALBUMIN,  in the last 72 hours No results found for this basename: LIPASE, AMYLASE,  in the last 72 hours  CBC:  Recent Labs  07/17/14 0500 07/18/14 0413  WBC 10.0 11.3*  HGB 11.8* 12.2*  HCT 37.6* 39.8  MCV 90.0 90.2  PLT 355 364    Cardiac Enzymes: No results found for this basename: CKTOTAL, CKMB, TROPONINI,  in the last 72 hours  Coagulation Studies:  Recent Labs  07/19/14 0409  LABPROT 16.2*  INR 1.30  Other results:  EKG :  NSR with RBBB.   Medications:    Infusions: . sodium chloride 15 mL/hr (07/17/14 1229)  . sodium chloride 75 mL/hr at 07/19/14 0500  . fentaNYL infusion INTRAVENOUS 75 mcg/hr (07/18/14 0800)  . heparin 1,300 Units/hr (07/19/14 0530)    Scheduled Medications: . albuterol  2.5 mg Nebulization Q6H  . antiseptic oral rinse  7 mL Mouth Rinse QID  . atorvastatin  80 mg Per Tube q1800  . chlorhexidine  15 mL Mouth Rinse BID  . feeding supplement (PRO-STAT SUGAR FREE 64)  60 mL Oral BID  . feeding supplement (VITAL HIGH PROTEIN)  1,000 mL Per Tube Q24H  . free water  300 mL Per Tube Q6H  . furosemide  20 mg Intravenous Daily  . hydrALAZINE  10 mg Oral 3 times per day  . insulin aspart  0-20 Units Subcutaneous 6 times per day  . insulin glargine  40 Units Subcutaneous BID  . methylPREDNISolone (SOLU-MEDROL) injection  20 mg Intravenous Daily    . pantoprazole sodium  40 mg Per Tube Daily  . sodium chloride  3 mL Intravenous Q12H    Assessment/ Plan:   Principal Problem:   Cardiac arrest Active Problems:   Acute on chronic kidney failure   Hyperkalemia   CHF (congestive heart failure)   DM (diabetes mellitus)   CAD (coronary artery disease) of artery bypass graft   HTN (hypertension)   COPD (chronic obstructive pulmonary disease)   GERD (gastroesophageal reflux disease)   Acute encephalopathy   CAP (community acquired pneumonia)  1. Cardiorespiratory arrest:  Patient was reported as having VT while in the echo lab.  He had a trivial Troponin bump ( not c/w an MI) and has normal LV function.  It's possible that his arrest was due to something else.   The echo done the following day shows severe pulmonary hypertension and RV failure.   I think it will be important to evaluate him for PE.  He has a RBBB at baseline and an episode of sinus tach or paroxysmal atrial fib would look similar to VT. His d-dimer was minimally elevated.  He may need further evaluation to look for PE.   2. CAD:  Hx of CABG in the  Past.  He may ultimately need a right and left heart cardiac catheterization.   He is refusing cath today. He is awake and alert.  Has had cath in the past and knows what he is saying. He is not having any angina.    3. CHF - his systolic function is normal Refusing central line .  If he agrees to a central line, we will get a Co-ox.   5. Acute renal insufficiency: The patient originally presented to [facility] with a creatinine of around 5.9. He had a potassium of 6.9. He was transferred to [facility].   His creatinine has improved     Disposition:  Length of Stay: 8  Vesta Mixer, Montez Hageman., MD, [facility] 07/19/2014, 8:10 AM Office [phone] Pager [phone]

## 2014-07-19 NOTE — Progress Notes (Signed)
ANTICOAGULATION CONSULT NOTE - Follow Up Consult  Pharmacy Consult for heparin Indication: r/o PE  No Known Allergies  Patient Measurements: Height:  (160 cm) Weight: 207 lb 14.3 oz (94.3 kg) IBW/kg (Calculated) : 56.9 Heparin Dosing Weight: 79 kg  Vital Signs: Temp: 98.6 F (37 C) (09/22 1600) Temp src: Oral (09/22 1600) BP: 153/66 mmHg (09/22 1523) Pulse Rate: 76 (09/22 1600)  Labs:  Recent Labs  07/17/14 0500 07/18/14 0413 07/19/14 0409 07/19/14 0950 07/19/14 1400  HGB 11.8* 12.2*  --  12.4*  --   HCT 37.6* 39.8  --  40.5  --   PLT 355 364  --  348  --   LABPROT  --   --  16.2*  --   --   INR  --   --  1.30  --   --   HEPARINUNFRC 0.64 0.40 <0.10*  --  0.38  CREATININE 1.38* 1.08  --  1.07  --     Estimated Creatinine Clearance: 80.3 ml/min (by C-G formula based on Cr of 1.07).   Medications:  Scheduled:  . albuterol  2.5 mg Nebulization Q6H  . antiseptic oral rinse  7 mL Mouth Rinse QID  . atorvastatin  80 mg Per Tube q1800  . chlorhexidine  15 mL Mouth Rinse BID  . feeding supplement (PRO-STAT SUGAR FREE 64)  60 mL Oral BID  . feeding supplement (VITAL HIGH PROTEIN)  1,000 mL Per Tube Q24H  . free water  300 mL Per Tube Q6H  . furosemide  20 mg Intravenous Daily  . hydrALAZINE  10 mg Oral 3 times per day  . insulin aspart  0-20 Units Subcutaneous 6 times per day  . insulin glargine  40 Units Subcutaneous BID  . pantoprazole sodium  40 mg Per Tube Daily   Infusions:  . sodium chloride 15 mL/hr (07/17/14 1229)  . heparin 1,300 Units/hr (07/19/14 1200)    Assessment: 53 yo m transferred [facility] 9/14 for weakness, AMS, possible CAP and hyperkalemia. Pt is s/p VT/VF cardiac arrest in echo lab 9/14 with 2 shocks and 3 doses of epi, bicarb, cal, amio. ROSC 16 minutes. Hypothermia protocol cancelled d/t patient regaining consciousness. Patient is on heparin for rule out PE. HL this AM was subtherapeutic <0.10. Per RN, heparin was turned  off for a few hours last night.  Repeat HL this afternoon was therapeutic at 0.38 on 1300 units/hr.  Will continue heparin on 1300 units/hr and check a 6-hr HL @ 2000. Hgb 12.4, plts 348, no s/s of bleeding.  Goal of Therapy:  Heparin level 0.3-0.7 units/ml Monitor platelets by anticoagulation protocol: Yes   Plan:  Continue heparin at 1300 units/hr 6-hr HL @ 2000 Monitor hgb/plts, s/s of bleeding, clinical course   Cassie L. Roseanne Reno, PharmD Clinical Pharmacy Resident Pager: [phone] 07/19/2014 4:55 PM

## 2014-07-19 NOTE — Progress Notes (Signed)
PT Cancellation Note  Patient Details Name: RAMSEY GUADAMUZ MRN: [id number] DOB: February 01, 1961   Cancelled Treatment:    Reason Eval/Treat Not Completed: Medical issues which prohibited therapy. Arrived at 13:40 to attempt PT. Pt/family in discussion re: when to extubate, possible need for trach, etc. At this time, MD still discussing with pt and RN feels pt will be too agitated to work with PT after this discussion.  Understand plan is to try to extubate 07/20/14. Will follow-up with PT when medically appropriate.   Shalaya Swailes 07/19/2014, 1:57 PM Pager [phone]

## 2014-07-19 NOTE — Progress Notes (Signed)
Pt assessed for cuff leak via ETT, no leak present.

## 2014-07-20 ENCOUNTER — Inpatient Hospital Stay (HOSPITAL_COMMUNITY): Payer: Medicaid Other

## 2014-07-20 ENCOUNTER — Encounter (HOSPITAL_COMMUNITY): Payer: Self-pay | Admitting: Radiology

## 2014-07-20 DIAGNOSIS — Z5189 Encounter for other specified aftercare: Secondary | ICD-10-CM

## 2014-07-20 DIAGNOSIS — R061 Stridor: Secondary | ICD-10-CM

## 2014-07-20 LAB — HEPARIN LEVEL (UNFRACTIONATED): HEPARIN UNFRACTIONATED: 0.43 [IU]/mL (ref 0.30–0.70)

## 2014-07-20 LAB — CBC
HCT: 40.7 % (ref 39.0–52.0)
Hemoglobin: 12.6 g/dL — ABNORMAL LOW (ref 13.0–17.0)
MCH: 27.9 pg (ref 26.0–34.0)
MCHC: 31 g/dL (ref 30.0–36.0)
MCV: 90 fL (ref 78.0–100.0)
Platelets: 350 10*3/uL (ref 150–400)
RBC: 4.52 MIL/uL (ref 4.22–5.81)
RDW: 16.4 % — ABNORMAL HIGH (ref 11.5–15.5)
WBC: 12.3 10*3/uL — ABNORMAL HIGH (ref 4.0–10.5)

## 2014-07-20 LAB — GLUCOSE, CAPILLARY
GLUCOSE-CAPILLARY: 104 mg/dL — AB (ref 70–99)
Glucose-Capillary: 181 mg/dL — ABNORMAL HIGH (ref 70–99)
Glucose-Capillary: 196 mg/dL — ABNORMAL HIGH (ref 70–99)
Glucose-Capillary: 272 mg/dL — ABNORMAL HIGH (ref 70–99)
Glucose-Capillary: 109 mg/dL — ABNORMAL HIGH (ref 70–99)
Glucose-Capillary: 165 mg/dL — ABNORMAL HIGH (ref 70–99)

## 2014-07-20 LAB — BLOOD GAS, ARTERIAL
Acid-Base Excess: 12 mmol/L — ABNORMAL HIGH (ref 0.0–2.0)
BICARBONATE: 36.7 meq/L — AB (ref 20.0–24.0)
Drawn by: 20517
FIO2: 1 %
MECHVT: 460 mL
O2 Saturation: 100 %
PCO2 ART: 54.8 mmHg — AB (ref 35.0–45.0)
PEEP: 5 cmH2O
PH ART: 7.441 (ref 7.350–7.450)
Patient temperature: 98.6
RATE: 20 resp/min
TCO2: 38.4 mmol/L (ref 0–100)
pO2, Arterial: 303 mmHg — ABNORMAL HIGH (ref 80.0–100.0)

## 2014-07-20 MED ORDER — CHLORHEXIDINE GLUCONATE 0.12 % MT SOLN: 15.0000 mL | Freq: Two times a day (BID) | OROMUCOSAL | Status: DC

## 2014-07-20 MED ORDER — LIDOCAINE HCL (CARDIAC) 20 MG/ML IV SOLN
INTRAVENOUS | Status: AC
  Filled 2014-07-20: qty 5

## 2014-07-20 MED ORDER — FENTANYL CITRATE 0.05 MG/ML IJ SOLN
200.0000 ug | Freq: Once | INTRAMUSCULAR | Status: AC
  Administered 2014-07-21: 200 ug via INTRAVENOUS
  Filled 2014-07-20: qty 4

## 2014-07-20 MED ORDER — SODIUM CHLORIDE 0.9 % IV SOLN
0.0000 ug/h | INTRAVENOUS | Status: DC
  Filled 2014-07-20: qty 50

## 2014-07-20 MED ORDER — MIDAZOLAM HCL 2 MG/2ML IJ SOLN
2.0000 mg | Freq: Once | INTRAMUSCULAR | Status: AC
  Administered 2014-07-20: 1 mg via INTRAVENOUS

## 2014-07-20 MED ORDER — MIDAZOLAM HCL 2 MG/2ML IJ SOLN
4.0000 mg | Freq: Once | INTRAMUSCULAR | Status: AC
  Administered 2014-07-21: 4 mg via INTRAVENOUS
  Filled 2014-07-20: qty 4

## 2014-07-20 MED ORDER — SODIUM CHLORIDE 0.9 % IV SOLN
25.0000 ug/h | INTRAVENOUS | Status: DC
  Administered 2014-07-20: 50 ug/h via INTRAVENOUS
  Filled 2014-07-20: qty 50

## 2014-07-20 MED ORDER — SUCCINYLCHOLINE CHLORIDE 20 MG/ML IJ SOLN
INTRAMUSCULAR | Status: AC
  Filled 2014-07-20: qty 1

## 2014-07-20 MED ORDER — MIDAZOLAM HCL 2 MG/2ML IJ SOLN
INTRAMUSCULAR | Status: AC
  Filled 2014-07-20: qty 2

## 2014-07-20 MED ORDER — ETOMIDATE 2 MG/ML IV SOLN
40.0000 mg | Freq: Once | INTRAVENOUS | Status: AC
  Administered 2014-07-21: 20 mg via INTRAVENOUS
  Filled 2014-07-20: qty 20

## 2014-07-20 MED ORDER — MIDAZOLAM HCL 2 MG/2ML IJ SOLN
2.0000 mg | INTRAMUSCULAR | Status: DC | PRN
  Administered 2014-07-20 (×2): 2 mg via INTRAVENOUS
  Filled 2014-07-20 (×4): qty 2

## 2014-07-20 MED ORDER — CETYLPYRIDINIUM CHLORIDE 0.05 % MT LIQD: 7.0000 mL | Freq: Four times a day (QID) | OROMUCOSAL | Status: DC

## 2014-07-20 MED ORDER — MIDAZOLAM HCL 2 MG/2ML IJ SOLN
2.0000 mg | Freq: Once | INTRAMUSCULAR | Status: AC
  Administered 2014-07-20: 2 mg via INTRAVENOUS

## 2014-07-20 MED ORDER — FENTANYL CITRATE 0.05 MG/ML IJ SOLN
100.0000 ug | Freq: Once | INTRAMUSCULAR | Status: AC
  Administered 2014-07-20: 100 ug via INTRAVENOUS

## 2014-07-20 MED ORDER — SODIUM CHLORIDE 0.9 % IV SOLN
INTRAVENOUS | Status: DC | PRN
  Administered 2014-07-25: 500 mL via INTRAVENOUS

## 2014-07-20 MED ORDER — ETOMIDATE 2 MG/ML IV SOLN
20.0000 mg | Freq: Once | INTRAVENOUS | Status: AC
  Administered 2014-07-20: 20 mg via INTRAVENOUS

## 2014-07-20 MED ORDER — FENTANYL CITRATE 0.05 MG/ML IJ SOLN
INTRAMUSCULAR | Status: AC
  Administered 2014-07-20: 100 ug via INTRAVENOUS
  Filled 2014-07-20: qty 2

## 2014-07-20 MED ORDER — FENTANYL CITRATE 0.05 MG/ML IJ SOLN
50.0000 ug | Freq: Once | INTRAMUSCULAR | Status: AC
  Administered 2014-07-20: 50 ug via INTRAVENOUS

## 2014-07-20 MED ORDER — INSULIN GLARGINE 100 UNIT/ML ~~LOC~~ SOLN
40.0000 [IU] | Freq: Two times a day (BID) | SUBCUTANEOUS | Status: DC
Start: 2014-07-20 — End: 2014-07-26
  Administered 2014-07-20 – 2014-07-26 (×10): 40 [IU] via SUBCUTANEOUS
  Filled 2014-07-20 (×13): qty 0.4

## 2014-07-20 MED ORDER — VECURONIUM BROMIDE 10 MG IV SOLR
10.0000 mg | Freq: Once | INTRAVENOUS | Status: AC
  Administered 2014-07-21: 10 mg via INTRAVENOUS
  Filled 2014-07-20: qty 10

## 2014-07-20 MED ORDER — MIDAZOLAM HCL 2 MG/2ML IJ SOLN
2.0000 mg | INTRAMUSCULAR | Status: DC | PRN
Start: 2014-07-20 — End: 2014-07-26
  Administered 2014-07-20: 2 mg via INTRAVENOUS

## 2014-07-20 MED ORDER — ETOMIDATE 2 MG/ML IV SOLN
INTRAVENOUS | Status: AC
  Administered 2014-07-20: 20 mg via INTRAVENOUS
  Filled 2014-07-20: qty 20

## 2014-07-20 MED ORDER — FENTANYL BOLUS VIA INFUSION
50.0000 ug | INTRAVENOUS | Status: DC | PRN
  Filled 2014-07-20: qty 100

## 2014-07-20 MED ORDER — FENTANYL BOLUS VIA INFUSION: 50.0000 ug | INTRAVENOUS | Status: DC | PRN

## 2014-07-20 MED ORDER — MIDAZOLAM HCL 2 MG/2ML IJ SOLN: 4.0000 mg | Freq: Once | INTRAMUSCULAR | Status: DC

## 2014-07-20 MED ORDER — SODIUM CHLORIDE 0.9 % IV SOLN
0.0000 ug/h | INTRAVENOUS | Status: DC
  Filled 2014-07-20 (×2): qty 50

## 2014-07-20 MED ORDER — IOHEXOL 350 MG/ML SOLN
80.0000 mL | Freq: Once | INTRAVENOUS | Status: AC | PRN
  Administered 2014-07-20: 100 mL via INTRAVENOUS

## 2014-07-20 MED ORDER — FENTANYL CITRATE 0.05 MG/ML IJ SOLN
INTRAMUSCULAR | Status: AC
  Filled 2014-07-20: qty 2

## 2014-07-20 MED ORDER — FENTANYL CITRATE 0.05 MG/ML IJ SOLN: 200.0000 ug | Freq: Once | INTRAMUSCULAR | Status: DC

## 2014-07-20 MED ORDER — IPRATROPIUM-ALBUTEROL 0.5-2.5 (3) MG/3ML IN SOLN
3.0000 mL | Freq: Four times a day (QID) | RESPIRATORY_TRACT | Status: AC
  Administered 2014-07-20 – 2014-07-22 (×8): 3 mL via RESPIRATORY_TRACT
  Filled 2014-07-20 (×8): qty 3

## 2014-07-20 MED ORDER — ROCURONIUM BROMIDE 50 MG/5ML IV SOLN
INTRAVENOUS | Status: AC
  Filled 2014-07-20: qty 2

## 2014-07-20 NOTE — Progress Notes (Signed)
Patient re-intubated by Dr. Dema Severin.  Intubated with 7.5 ETT secured at 23 at the lip.  BBS equal thorough out.  Placed on previous vent settings.  Will continue to monitor.

## 2014-07-20 NOTE — Progress Notes (Addendum)
PULMONARY / CRITICAL CARE MEDICINE   Name: Richard [name] MRN: [id number] DOB: 1961/03/08    ADMISSION DATE:  07/11/2014 CONSULTATION DATE: 9/14  REFERRING MD :  Benjamine Mola  CHIEF COMPLAINT:  Cardiac arrest.   INITIAL PRESENTATION:  53 year old male transferred to [facility] from [place] on 9/14 w/ working dx of acute on chronic renal failure, hyperkalemia, encephalopathy and possible CAP. He was in ECHO when suddenly developed VF/VT arrest. ROSC estimated at 10-15 minutes. PCCM asked to assist w/ care s/p arrest.   STUDIES:  9/14 ECHO limited >> stopped due to cardiac arrest 9/15 ECHO >> EF 65-70%, no rwma, RV mildly dilated, PA peak 71 9/14 renal US >> no hydro, 5 mm non-obs renal calculi in R lower pole, small ascites in RUQ 9/19 LE Doppler >>no DVT 9/23 CTA Chest>> 9/23 CT Neck>>  SIGNIFICANT EVENTS: 9/14  Admitted w/ acute on chronic renal failure, hyperkalemia and encephalopathy  9/14  Cardiac arrest (VF/VT) while in echo 9/18  Failed extubation with immediate need for re-intubation, started on Steroids 9/23 extubated>>>developed stridor and wheezing, reintubated within 2 hours of extubation  SUBJECTIVE: Patient doing well this morning, earlier had some agitation and fentanyl was given.  Sister at bedside, he was extubated at 10am. Mild stridor noted post extubation.  Attempted to extubated to bipap, but patient refused, therefore extubated HFNC.  VITAL SIGNS: Temp:  [97.8 F (36.6 C)-99 F (37.2 C)] 99 F (37.2 C) (09/23 0700) Pulse Rate:  [60-94] 79 (09/23 1005) Resp:  [8-25] 18 (09/23 1005) BP: (106-159)/(45-116) 148/84 mmHg (09/23 1005) SpO2:  [97 %-100 %] 99 % (09/23 1005) Arterial Line BP: (128-184)/(58-80) 170/75 mmHg (09/22 1823) FiO2 (%):  [40 %-50 %] 40 % (09/23 0900) Weight:  [206 lb 12.7 oz (93.8 kg)] 206 lb 12.7 oz (93.8 kg) (09/23 0500)  HEMODYNAMICS:    VENTILATOR SETTINGS: Vent Mode:  [-] PSV;CPAP FiO2 (%):  [40 %-50 %] 40 % Set Rate:  [20 bmp] 20 bmp Vt  Set:  [460 mL] 460 mL PEEP:  [5 cmH20] 5 cmH20 Pressure Support:  [10 cmH20] 10 cmH20 Plateau Pressure:  [15 cmH20-20 cmH20] 18 cmH20  INTAKE / OUTPUT:  Intake/Output Summary (Last 24 hours) at 07/20/14 1113 Last data filed at 07/20/14 0900  Gross per 24 hour  Intake 1946.5 ml  Output   2750 ml  Net -803.5 ml   PHYSICAL EXAMINATION: General: Obese male, in NAD on vent Neuro: awake, alert, follows commands, nods appropriately HEENT No clear JVD Cardiovascular: Distant, RRR, Nl S1/S2, -M/R/G. Lungs: : extubated, mild dec basilar breath sounds, mild wheezing, mild stridor, Abdomen: Soft, non-tender. + bowel sounds obese Musculoskeletal:  Intact  Skin:  LE edema improving   LABS:  CBC  Recent Labs Lab 07/17/14 0500 07/18/14 0413 07/19/14 0950  WBC 10.0 11.3* 11.4*  HGB 11.8* 12.2* 12.4*  HCT 37.6* 39.8 40.5  PLT 355 364 348     Coag's  Recent Labs Lab 07/19/14 0409  INR 1.30   BMET  Recent Labs Lab 07/17/14 0500 07/18/14 0413 07/19/14 0950  NA 149* 155* 153*  K 3.7 3.9 3.7  CL 101 106 104  CO2 35* 39* 38*  BUN 87* 71* 65*  CREATININE 1.38* 1.08 1.07  GLUCOSE 193* 161* 135*   Electrolytes  Recent Labs Lab 07/14/14 0440 07/15/14 0415 07/15/14 1200 07/16/14 0422 07/17/14 0500 07/18/14 0413 07/19/14 0950  CALCIUM 8.4 9.1  --  9.1 8.9 9.3 9.3  MG 1.3* 1.3* 2.0 2.4  --   --   --  PHOS 3.8 3.7  --  2.8  --   --   --    Sepsis Markers No results found for this basename: LATICACIDVEN, PROCALCITON, O2SATVEN,  in the last 168 hours ABG   Recent Labs Lab 07/15/14 0500 07/15/14 1546 07/16/14 0414  PHART 7.437 7.344* 7.497*  PCO2ART 55.0* 77.1* 49.9*  PO2ART 77.3* 99.0 217.0*   Liver Enzymes No results found for this basename: AST, ALT, ALKPHOS, BILITOT, ALBUMIN,  in the last 168 hours Cardiac Enzymes  Recent Labs Lab 07/14/14 1014  PROBNP 1548.0*   Glucose  Recent Labs Lab 07/19/14 1537 07/19/14 1953 07/19/14 2010 07/20/14 0005  07/20/14 0345 07/20/14 0732  GLUCAP 227* 306* 317* 272* 181* 196*   Imaging Dg Chest Port 1 View  07/19/2014   CLINICAL DATA:  respiratory failure, interval change  EXAM: PORTABLE CHEST - 1 VIEW  COMPARISON:  July 18, 2014  FINDINGS: The heart size and mediastinal contours are stable. Heart size is enlarged. Endotracheal tube is identified distal tip 3.3 cm from carina. Nasogastric tube is identified with the distal tip not included on film but is at least in the stomach. There is diffuse increased pulmonary interstitium with more confluent consolidation of the left lung base. There is no pneumothorax. Patient is status post prior fixation of lower cervical spine.  IMPRESSION: Mild congestive heart failure unchanged compared prior exam. Confluent consolidation of the left lung base, superimposed pneumonia is not excluded.   Electronically Signed   By: Sherian Rein M.D.   On: 07/19/2014 07:43     ASSESSMENT / PLAN:  PULMONARY OETT 9/14>>>9/18, 9/18>> A:  Acute respiratory failure s/p cardiopulm arrest.  Pulmonary Edema Pulmonary infiltrates R>L  > favor pulm edema but with leukocytosis PAH - noted on ECHO, PA peak 71, R/O PE Tobacco Abuse P:   Daily SBT/WUA - weans well on 5/5, no cuff leak,  CXR today with worsening consolidation/atelectasis of the LLL on today's CXR.  Most likely atelectasis, he does not have fever or WBC, no antibiotic at this time Titrate O2 for sats Cont  empiric heparin gtt for PE, no lovenox with renal hx.  Will plan for CT chest with contrast in a few days depending on renal fcn He has refused cardiac cath to eval his CAD and pHTN at this time.  Complete sedation holiday starting today, prn versed\fentanyl, and address reintubation status he fails extubation.  Currently he is refusing trach\peg if needed in the future.   CARDIOVASCULAR R CVL 9/14>>921 R radial Aline 9/14>> A:  VF/VT arrest. 10-15 minutes to ROSC  Circulatory shock - ? Sepsis vs  cardiogenic  Pulmonary Edema  R/O PE P:  D/ced levaquin given concern for qtc changes Lisinopril restarted per cardiology prn IV labetalol (discussed with cards).  CVL with 4cm migration, dc'ed>>9/21   RENAL A:   Acute on chronic renal failure (baseline scr 1.5-2.0) Hyperkalemia > improving Hypernatremia P:   BMET in AM Lasix reduced to  qd (9/21)-watch Na & BUN Once Na starts to normalize will consider a standing dose of lasix to go home on Replace electrolytes as indicated. sCr improved today will send for CTA Chest to eval for PE  GASTROINTESTINAL A:   Obesity  P:   Restarted TF 9/19 PPI   HEMATOLOGIC A:   Anemia  R/O PE P:  Trend CBC and coags  [facility] heparin  LE Dopplers with NO DVT  INFECTIOUS A:   Severe CAP? P:   BCx2 9/14>>>ng Abx: levaquin, start  date 9/14>>>9/14 ABX: doxy 9/14>>>9/18 ABX rocephin 9/14>>>9/21 (stop date in place)  Monitor fever curve / leukocytosis   ENDOCRINE A:   DM - on 65 bid lantus at home P:   SSI protocol, insulin gtt  -Once CBGs controlled, suggest give high dose lantus & transition to SSI  NEUROLOGIC A:   Acute encephalopathy s/p cardiac arrest > improving P:   Fentanyl prn Haldol if agitated, caution with QT   I have personally obtained a history, examined the patient, evaluated laboratory and imaging results, formulated the assessment and plan and placed orders.  CRITICAL CARE: The patient is critically ill with multiple organ systems failure and requires high complexity decision making for assessment and support, frequent evaluation and titration of therapies, application of advanced monitoring technologies and extensive interpretation of multiple databases. Critical Care Time devoted to patient care services described in this note is 55 minutes.    UPDATE 11:25AM Patient with continued worsening stridor, acutely, and wheezes, he is agreeable to reintubation and tracheostomy. Neck CT Today also CTA Chest.

## 2014-07-20 NOTE — Progress Notes (Signed)
07/19/2014  1915  Monitor alarmed art line disconnect. Pt had removed art line in Rt radial and NSL in Lt wrist. Asked why?  Said  he wanted to. Pressure held to Rt radial and dressing applied. Verena Shawgo, Linnell Fulling

## 2014-07-20 NOTE — Progress Notes (Signed)
eLink Physician-Brief Progress Note Patient Name: FAHEEM ZIEMANN DOB: 10/24/1961 MRN: [id number]   Date of Service  07/20/2014  HPI/Events of Note  Planned trach tube 9/24  eICU Interventions  Pre-trach order set entered TFs to be held @ 5:00 AM AM dose of Lantus to be held Heparin gtt to be discontinued @ [facility] - will need to be reordered post procedure     Intervention Category Major Interventions: Other:  Billy Fischer 07/20/2014, 4:15 PM

## 2014-07-20 NOTE — Progress Notes (Signed)
PROGRESS NOTE  Subjective:   53 yo with hx  Of CAD / CABG, DM, COPD, CHF Admitted with altered mental status and hyperkalemia . He had a VF/VT cardiac arrest while in the echo lab.   Spoken with several members of the code team who were present. He was asystolic at least some part of the time. ROSC estimated at 10-15 minutes.    Has improved significantly. Echo from 9/15 shows normal LV function - EF of 65-70%. , mild-mod TR, severe pulmonary HTN with PA pressure of 71 mm Hg.   He failed extubation - he seems awake and alert. -He was able to answer questions with nodding and shaking of his head. I see that he's also been writing some notes.  He still refuses cath this am.  He is awake and alert .  Objective:    Vital Signs:   Temp:  [97.8 F (36.6 C)-99 F (37.2 C)] 99 F (37.2 C) (09/23 0700) Pulse Rate:  [60-94] 83 (09/23 0754) Resp:  [8-25] 16 (09/23 0754) BP: (106-175)/(45-102) 118/57 mmHg (09/23 0754) SpO2:  [97 %-100 %] 100 % (09/23 0754) Arterial Line BP: (128-184)/(58-80) 170/75 mmHg (09/22 1823) FiO2 (%):  [40 %-50 %] 40 % (09/23 0800) Weight:  [206 lb 12.7 oz (93.8 kg)] 206 lb 12.7 oz (93.8 kg) (09/23 0500)  Last BM Date: 07/11/14   24-hour weight change: Weight change: -1 lb 1.6 oz (-0.5 kg)  Weight trends: Filed Weights   07/18/14 0500 07/19/14 0500 07/20/14 0500  Weight: 208 lb 5.4 oz (94.5 kg) 207 lb 14.3 oz (94.3 kg) 206 lb 12.7 oz (93.8 kg)    Intake/Output:  09/22 0701 - 09/23 0700 In: 2145 [I.V.:464; NG/GT:1681] Out: 2850 [Urine:2850]     Physical Exam: BP 118/57  Pulse 83  Temp(Src) 99 F (37.2 C) (Oral)  Resp 16  Ht  (1.6 m)  Wt 206 lb 12.7 oz (93.8 kg)  BMI 36.64 kg/m2  SpO2 100%  Wt Readings from Last 3 Encounters:  07/20/14 206 lb 12.7 oz (93.8 kg)  07/20/14 206 lb 12.7 oz (93.8 kg)    General: Vital signs reviewed and noted. Awake and alert   Head: Normocephalic, atraumatic.  Eyes: conjunctivae/corneas clear.   EOM's intact.   Throat: Intubated   Neck:   Thick , left IJ in place  Lungs:      on vent  Heart:  RR,   Abdomen:  Soft, non-tender, obese  Extremities: Left arm is diffusely edematous    Neurologic: Awake, nodded and shook head in answer to questions.   Psych: Seems appropriate    Labs: BMET:  Recent Labs  07/18/14 0413 07/19/14 0950  NA 155* 153*  K 3.9 3.7  CL 106 104  CO2 39* 38*  GLUCOSE 161* 135*  BUN 71* 65*  CREATININE 1.08 1.07  CALCIUM 9.3 9.3    Liver function tests: No results found for this basename: AST, ALT, ALKPHOS, BILITOT, PROT, ALBUMIN,  in the last 72 hours No results found for this basename: LIPASE, AMYLASE,  in the last 72 hours  CBC:  Recent Labs  07/18/14 0413 07/19/14 0950  WBC 11.3* 11.4*  HGB 12.2* 12.4*  HCT 39.8 40.5  MCV 90.2 90.0  PLT 364 348    Cardiac Enzymes: No results found for this basename: CKTOTAL, CKMB, TROPONINI,  in the last 72 hours  Coagulation Studies:  Recent Labs  07/19/14 0409  LABPROT 16.2*  INR 1.30  Other results:  EKG :  NSR with RBBB.   Medications:    Infusions: . sodium chloride 15 mL/hr (07/17/14 1229)  . fentaNYL infusion INTRAVENOUS    . fentaNYL infusion INTRAVENOUS 50 mcg/hr (07/20/14 0225)  . heparin 1,300 Units/hr (07/20/14 0128)    Scheduled Medications: . albuterol  2.5 mg Nebulization Q6H  . antiseptic oral rinse  7 mL Mouth Rinse QID  . atorvastatin  80 mg Per Tube q1800  . chlorhexidine  15 mL Mouth Rinse BID  . feeding supplement (PRO-STAT SUGAR FREE 64)  60 mL Oral BID  . feeding supplement (VITAL HIGH PROTEIN)  1,000 mL Per Tube Q24H  . free water  300 mL Per Tube Q6H  . furosemide  20 mg Intravenous Daily  . hydrALAZINE  10 mg Oral 3 times per day  . insulin aspart  0-20 Units Subcutaneous 6 times per day  . insulin glargine  40 Units Subcutaneous BID  . pantoprazole sodium  40 mg Per Tube Daily    Assessment/ Plan:   Principal Problem:   Cardiac  arrest Active Problems:   Acute on chronic kidney failure   Hyperkalemia   CHF (congestive heart failure)   DM (diabetes mellitus)   CAD (coronary artery disease) of artery bypass graft   HTN (hypertension)   COPD (chronic obstructive pulmonary disease)   GERD (gastroesophageal reflux disease)   Acute encephalopathy   CAP (community acquired pneumonia)  1. Cardiorespiratory arrest:  Patient was reported as having VT while in the echo lab.  He had a trivial Troponin bump ( not c/w an MI) and has normal LV function.    2. CAD:  Hx of CABG in the  Past.  He may ultimately need a right and left heart cardiac catheterization.   He has refused cath  He is awake and alert.  Has had cath in the past and knows what he is saying. He is not having any angina.    3. CHF - his systolic function is normal Refusing central line .  If he agrees to a central line, we will get a Co-ox.   5. Acute renal insufficiency: The patient originally presented to [facility] with a creatinine of around 5.9. He had a potassium of 6.9. He was transferred to [facility].   His creatinine has improved     Disposition:  Length of Stay: 9  Vesta Mixer, Montez Hageman., MD, [facility] 07/20/2014, 8:39 AM Office [phone] Pager [phone]

## 2014-07-20 NOTE — Progress Notes (Signed)
ANTICOAGULATION CONSULT NOTE - Follow Up Consult  Pharmacy Consult for heparin Indication: r/o PE  No Known Allergies  Patient Measurements: Height:  (160 cm) Weight: 206 lb 12.7 oz (93.8 kg) IBW/kg (Calculated) : 56.9 Heparin Dosing Weight: 79 kg  Vital Signs: Temp: 99.6 F (37.6 C) (09/23 1156) Temp src: Oral (09/23 1156) BP: 115/63 mmHg (09/23 1215) Pulse Rate: 71 (09/23 1215)  Labs:  Recent Labs  07/18/14 0413 07/19/14 0409 07/19/14 0950 07/19/14 1400 07/19/14 2006 07/20/14 0615 07/20/14 1018  HGB 12.2*  --  12.4*  --   --   --  12.6*  HCT 39.8  --  40.5  --   --   --  40.7  PLT 364  --  348  --   --   --  350  LABPROT  --  16.2*  --   --   --   --   --   INR  --  1.30  --   --   --   --   --   HEPARINUNFRC 0.40 <0.10*  --  0.38 0.51 0.43  --   CREATININE 1.08  --  1.07  --   --   --   --     Estimated Creatinine Clearance: 80 ml/min (by C-G formula based on Cr of 1.07).   Medications:  Scheduled:  . albuterol  2.5 mg Nebulization Q6H  . antiseptic oral rinse  7 mL Mouth Rinse QID  . atorvastatin  80 mg Per Tube q1800  . chlorhexidine  15 mL Mouth Rinse BID  . feeding supplement (PRO-STAT SUGAR FREE 64)  60 mL Oral BID  . feeding supplement (VITAL HIGH PROTEIN)  1,000 mL Per Tube Q24H  . free water  300 mL Per Tube Q6H  . furosemide  20 mg Intravenous Daily  . hydrALAZINE  10 mg Oral 3 times per day  . insulin aspart  0-20 Units Subcutaneous 6 times per day  . insulin glargine  40 Units Subcutaneous BID  . ipratropium-albuterol  3 mL Nebulization Q6H  . lidocaine (cardiac) 100 mg/9ml      . pantoprazole sodium  40 mg Per Tube Daily  . rocuronium      . succinylcholine       Infusions:  . fentaNYL infusion INTRAVENOUS 100 mcg/hr (07/20/14 1231)  . heparin 1,300 Units/hr (07/20/14 0128)    Assessment: 53 yo m transferred from [facility] 9/14 for weakness, AMS, possible CAP and hyperkalemia. Pt is s/p VT/VF cardiac arrest in echo lab  9/14 with 2 shocks and 3 doses of epi, bicarb, cal, amio. ROSC 16 minutes. Hypothermia protocol cancelled d/t patient regaining consciousness. Patient is on heparin for rule out PE.  He has failed extubation twice with plans for trach tomorrow. HL this AM was therapeutic at 0.43 (has been in range x3).  Will continue heparin rate at 1300 units/hr and check a HL with AM labs. Hgb 12.6, plts 350, no s/s of bleeding.  Goal of Therapy:  Heparin level 0.3-0.7 units/ml Monitor platelets by anticoagulation protocol: Yes   Plan:  Continue heparin infusion 1300 units/hr Check HL with AM labs Monitor hgb/plts, s/s of bleeding, clinical course  Cassie L. Roseanne Reno, PharmD Clinical Pharmacy Resident Pager: [phone] 07/20/2014 2:16 PM

## 2014-07-20 NOTE — Procedures (Signed)
Procedure Note:  Orotracheal Intubation Implied consent due to emergent nature of patient's condition. Correct Patient, Name & ID confirmed.  The patient was pre-oxygenated and then, under direct visualization, a 7.5 mm cuffed endotracheal tube was placed through the vocal cords into the trachea. Total attempts made 1, using glidescope, normal VC, mildly swollen false cords, mild thick secretions around false cords. During intubation an assistant applied gentle pressure to the cricoid cartilage.  Position confirmed by auscultation of lungs (good breath sounds bilaterally) and no stomach sounds. Tube secured at 23 cm. Pulse ox 98 %. CO2 detector in place with appropriate color change.  Pt tolerated procedure well.  No complications were noted.   Intubation Meds Fentanyl - Versed -  Etomidate -    Stephanie Acre, MD [facility] Pager [phone] On Call Pager [phone]

## 2014-07-20 NOTE — Procedures (Signed)
Extubation Procedure Note  Patient Details:   Name: Richard [name] DOB: 03-Feb-1961 MRN: [id number]   Airway Documentation:     Evaluation  O2 sats: stable throughout Complications: No apparent complications Patient did tolerate procedure well. Bilateral Breath Sounds: Diminished Suctioning: Airway Yes  Order received for extubation.  Cuff leak negative, MD wanted to proceed with extubation.  Extubated to 30l HFNC.  Minimal stridor noted.  Patient able to vocalize, no complications noted.  Will continue to monitor.  Lysbeth Penner [facility] 07/20/2014, 10:06 AM

## 2014-07-20 NOTE — Progress Notes (Signed)
Pt extremely agitated, fighting vent and biting on ETT. PRN medication not providing relief to help pt relax and stop biting ETT; as a result O2 stat continued to decrease. ELink Md made aware of situation and order to start a Fentanyl drip and give additional dose of versed until RASS goal of 0 reached.  Vital signs remain stable. Will continue to monitor pt.

## 2014-07-21 ENCOUNTER — Inpatient Hospital Stay (HOSPITAL_COMMUNITY): Payer: Medicaid Other

## 2014-07-21 LAB — BASIC METABOLIC PANEL
ANION GAP: 13 (ref 5–15)
BUN: 44 mg/dL — ABNORMAL HIGH (ref 6–23)
CALCIUM: 9.5 mg/dL (ref 8.4–10.5)
CO2: 33 meq/L — AB (ref 19–32)
Chloride: 102 mEq/L (ref 96–112)
Creatinine, Ser: 0.93 mg/dL (ref 0.50–1.35)
GFR calc non Af Amer: >90 mL/min (ref >90)
Glucose, Bld: 99 mg/dL (ref 70–99)
Potassium: 3.9 mEq/L (ref 3.7–5.3)
SODIUM: 148 meq/L — AB (ref 137–147)

## 2014-07-21 LAB — CBC
HCT: 45.5 % (ref 39.0–52.0)
Hemoglobin: 14.2 g/dL (ref 13.0–17.0)
MCH: 28.3 pg (ref 26.0–34.0)
MCHC: 31.2 g/dL (ref 30.0–36.0)
MCV: 90.6 fL (ref 78.0–100.0)
PLATELETS: 370 10*3/uL (ref 150–400)
RBC: 5.02 MIL/uL (ref 4.22–5.81)
RDW: 16.7 % — ABNORMAL HIGH (ref 11.5–15.5)
WBC: 12.3 10*3/uL — AB (ref 4.0–10.5)

## 2014-07-21 LAB — GLUCOSE, CAPILLARY
GLUCOSE-CAPILLARY: 136 mg/dL — AB (ref 70–99)
GLUCOSE-CAPILLARY: 97 mg/dL (ref 70–99)
Glucose-Capillary: 123 mg/dL — ABNORMAL HIGH (ref 70–99)
Glucose-Capillary: 166 mg/dL — ABNORMAL HIGH (ref 70–99)
Glucose-Capillary: 181 mg/dL — ABNORMAL HIGH (ref 70–99)
Glucose-Capillary: 215 mg/dL — ABNORMAL HIGH (ref 70–99)

## 2014-07-21 LAB — APTT: aPTT: 26 seconds (ref 24–37)

## 2014-07-21 MED ORDER — HEPARIN SODIUM (PORCINE) 5000 UNIT/ML IJ SOLN
5000.0000 [IU] | Freq: Three times a day (TID) | INTRAMUSCULAR | Status: DC
  Administered 2014-07-21 – 2014-07-26 (×14): 5000 [IU] via SUBCUTANEOUS
  Filled 2014-07-21 (×17): qty 1

## 2014-07-21 MED ORDER — SODIUM CHLORIDE 0.9 % IV SOLN
1.5000 g | Freq: Four times a day (QID) | INTRAVENOUS | Status: DC
  Administered 2014-07-21 – 2014-07-25 (×17): 1.5 g via INTRAVENOUS
  Filled 2014-07-21 (×20): qty 1.5

## 2014-07-21 NOTE — Progress Notes (Signed)
Speech Language Pathology Discharge Patient Details Name: Richard [name] MRN: [id number] DOB: 02-08-1961 Today's Date: 07/21/2014 Time:  -     Patient discharged from SLP services secondary to Continues on vent.  Will sign off. Please reconsult when if extubated or trach'd..  Please see latest therapy progress note for current level of functioning and progress toward goals.    Progress and discharge plan discussed with patient and/or caregiver: not notified  GO     Roque Cash, Sarita Haver [place].Ed CCC-SLP Pager [phone]   07/21/2014, 9:03 AM

## 2014-07-21 NOTE — Progress Notes (Signed)
ANTIBIOTIC CONSULT NOTE - FOLLOW UP  Pharmacy Consult for unasyn Indication: sinusitis  No Known Allergies  Patient Measurements: Height:  (160 cm) Weight: 203 lb 7.8 oz (92.3 kg) IBW/kg (Calculated) : 56.9  Vital Signs: Temp: 98.5 F (36.9 C) (09/24 0845) Temp src: Oral (09/24 0845) BP: 139/57 mmHg (09/24 0900) Pulse Rate: 81 (09/24 0900) Intake/Output from previous day: 09/23 0701 - 09/24 0700 In: 2067.3 [I.V.:583.9; NG/GT:1483.3] Out: 2800 [Urine:2800] Intake/Output from this shift:    Labs:  Recent Labs  07/19/14 0950 07/20/14 1018  WBC 11.4* 12.3*  HGB 12.4* 12.6*  PLT 348 350  CREATININE 1.07  --    Estimated Creatinine Clearance: 79.4 ml/min (by C-G formula based on Cr of 1.07). No results found for this basename: VANCOTROUGH, Leodis Binet, VANCORANDOM, GENTTROUGH, GENTPEAK, GENTRANDOM, TOBRATROUGH, TOBRAPEAK, TOBRARND, AMIKACINPEAK, AMIKACINTROU, AMIKACIN,  in the last 72 hours   Microbiology: Recent Results (from the past 720 hour(s))  MRSA PCR SCREENING     Status: None   Collection Time    07/11/14  9:46 AM      Result Value Ref Range Status   MRSA by PCR NEGATIVE  NEGATIVE Final   Comment:            The GeneXpert MRSA Assay (FDA     approved for NASAL specimens     only), is one component of a     comprehensive MRSA colonization     surveillance program. It is not     intended to diagnose MRSA     infection nor to guide or     monitor treatment for     MRSA infections.  CULTURE, BLOOD (ROUTINE X 2)     Status: None   Collection Time    07/11/14 11:10 AM      Result Value Ref Range Status   Specimen Description BLOOD LEFT THUMB   Final   Special Requests BOTTLES DRAWN AEROBIC ONLY 2CC   Final   Culture  Setup Time     Final   Value: 07/11/2014 14:31     Performed at [facility]   Culture     Final   Value: NO GROWTH 5 DAYS     Note: Culture results may be compromised due to an inadequate volume of blood received in culture  bottles.     Performed at [facility]   Report Status 07/17/2014 FINAL   Final  CULTURE, BLOOD (ROUTINE X 2)     Status: None   Collection Time    07/11/14 11:20 AM      Result Value Ref Range Status   Specimen Description BLOOD CENTRAL LINE   Final   Special Requests BOTTLES DRAWN AEROBIC AND ANAEROBIC 10CC   Final   Culture  Setup Time     Final   Value: 07/11/2014 14:31     Performed at [facility]   Culture     Final   Value: NO GROWTH 5 DAYS     Performed at [facility]   Report Status 07/17/2014 FINAL   Final    Anti-infectives   Start     Dose/Rate Route Frequency Ordered Stop   07/21/14 1000  ampicillin-sulbactam (UNASYN) 1.5 g in sodium chloride 0.9 % 50 mL IVPB     1.5 g 100 mL/hr over 30 Minutes Intravenous Every 6 hours 07/21/14 0908     07/11/14 1200  levofloxacin (LEVAQUIN) IVPB 750 mg  Status:  Discontinued  750 mg 100 mL/hr over 90 Minutes Intravenous Every 48 hours 07/11/14 0931 07/11/14 1009   07/11/14 1030  cefTRIAXone (ROCEPHIN) 1 g in dextrose 5 % 50 mL IVPB     1 g 100 mL/hr over 30 Minutes Intravenous Every 24 hours 07/11/14 1009 07/18/14 1010   07/11/14 1030  doxycycline (VIBRAMYCIN) 100 mg in dextrose 5 % 250 mL IVPB  Status:  Discontinued     100 mg 125 mL/hr over 120 Minutes Intravenous Every 12 hours 07/11/14 1009 07/15/14 1650   07/11/14 0800  levofloxacin (LEVAQUIN) IVPB 750 mg  Status:  Discontinued     750 mg 100 mL/hr over 90 Minutes Intravenous Every 24 hours 07/11/14 0709 07/11/14 0709   07/11/14 0715  levofloxacin (LEVAQUIN) IVPB 750 mg  Status:  Discontinued     750 mg 100 mL/hr over 90 Minutes Intravenous  Once 07/11/14 0710 07/11/14 0932      Assessment: 53 yo male with recent treatment for CAP now noted with bilateral mastoid effusion and pansinus disease to begin unasyn for sinusitis. WBC=12.3, tmax= 99.6, SCr= 1.07 and CrCl ~ 80  Unasyn 9/24>> Doxy 9/14 >> 9/18 CTX 9/14 >> 9/21    Plan:  -Unasyn  1.5gm IV q6h -Will follow renal function and clinical progress  Harland German, Pharm D 07/21/2014 10:33 AM

## 2014-07-21 NOTE — Progress Notes (Signed)
Pt post trach, still sedated.  Continue on full vent support settings for now.

## 2014-07-21 NOTE — Progress Notes (Signed)
PT Cancellation Note  Patient Details Name: Richard [name] MRN: [id number] DOB: [birth year]   Cancelled Treatment:    Reason Eval/Treat Not Completed: Patient not medically ready. Pt underwent tracheostomy this morning. Will attempt PT 07/22/14 if appropriate.   Giovoni Bunch 07/21/2014, 11:48 AM Pager [phone]

## 2014-07-21 NOTE — Progress Notes (Addendum)
PULMONARY / CRITICAL CARE MEDICINE   Name: Richard [name] MRN: [id number] DOB: 1961-01-08    ADMISSION DATE:  07/11/2014 CONSULTATION DATE: 9/14  REFERRING MD :  Benjamine Mola  CHIEF COMPLAINT:  Cardiac arrest.   INITIAL PRESENTATION:  53 year old male transferred [facility] from [place] on 9/14 w/ working dx of acute on chronic renal failure, hyperkalemia, encephalopathy and possible CAP. He was in ECHO when suddenly developed VF/VT arrest. ROSC estimated at 10-15 minutes. PCCM asked to assist w/ care s/p arrest.   STUDIES:  9/14 ECHO limited >> stopped due to cardiac arrest 9/15 ECHO >> EF 65-70%, no rwma, RV mildly dilated, PA peak 71 9/14 renal US >> no hydro, 5 mm non-obs renal calculi in R lower pole, small ascites in RUQ 9/19 LE Doppler >>no DVT 9/23 CTA Chest>>no PE, some atelectasis 9/23 CT Neck>>no masses.  Has left mastoid effusion, and sinus disease  SIGNIFICANT EVENTS: 9/14  Admitted w/ acute on chronic renal failure, hyperkalemia and encephalopathy  9/14  Cardiac arrest (VF/VT) while in echo 9/18  Failed extubation with immediate need for re-intubation, started on Steroids 9/23 extubated>>>developed stridor and wheezing, reintubated within 2 hours of extubation 9/24 percutaneous tracheostomy (Dr. Molli Knock)  SUBJECTIVE: patient awake this morning, comfortable in bed, Fentanyl off for , patient unsure this morning about trach; yesterday he was in agreement for reintubation and trach. Will continue to discuss with patient and his sister. Patient refused blood draws this morning  VITAL SIGNS: Temp:  [98.5 F (36.9 C)-99.6 F (37.6 C)] 98.5 F (36.9 C) (09/24 0344) Pulse Rate:  [66-100] 94 (09/24 0746) Resp:  [12-23] 18 (09/24 0746) BP: (102-148)/(41-84) 112/48 mmHg (09/24 0746) SpO2:  [86 %-100 %] 97 % (09/24 0746) FiO2 (%):  [40 %-100 %] 40 % (09/24 0746) Weight:  [203 lb 7.8 oz (92.3 kg)] 203 lb 7.8 oz (92.3 kg) (09/24 0436)  HEMODYNAMICS:    VENTILATOR  SETTINGS: Vent Mode:  [-] PSV;CPAP FiO2 (%):  [40 %-100 %] 40 % Set Rate:  [20 bmp] 20 bmp Vt Set:  [460 mL] 460 mL PEEP:  [5 cmH20] 5 cmH20 Pressure Support:  [10 cmH20] 10 cmH20 Plateau Pressure:  [12 cmH20-20 cmH20] 20 cmH20  INTAKE / OUTPUT:  Intake/Output Summary (Last 24 hours) at 07/21/14 0820 Last data filed at 07/21/14 0600  Gross per 24 hour  Intake 1999.25 ml  Output   2550 ml  Net -550.75 ml   PHYSICAL EXAMINATION: General: Obese male, in NAD on vent Neuro: awake, alert, follows commands, nods appropriately HEENT No clear JVD Cardiovascular: Distant, RRR, Nl S1/S2, -M/R/G. Lungs: : extubated, mild dec basilar breath sounds, mild wheezing, mild stridor, Abdomen: Soft, non-tender. + bowel sounds obese Musculoskeletal:  Intact  Skin:  LE edema improving, mild erythema and warmth to left LE and LUE   LABS:  CBC  Recent Labs Lab 07/18/14 0413 07/19/14 0950 07/20/14 1018  WBC 11.3* 11.4* 12.3*  HGB 12.2* 12.4* 12.6*  HCT 39.8 40.5 40.7  PLT 364 348 350     Coag's  Recent Labs Lab 07/19/14 0409  INR 1.30   BMET  Recent Labs Lab 07/17/14 0500 07/18/14 0413 07/19/14 0950  NA 149* 155* 153*  K 3.7 3.9 3.7  CL 101 106 104  CO2 35* 39* 38*  BUN 87* 71* 65*  CREATININE 1.38* 1.08 1.07  GLUCOSE 193* 161* 135*   Electrolytes  Recent Labs Lab 07/15/14 0415 07/15/14 1200 07/16/14 0422 07/17/14 0500 07/18/14 0413 07/19/14 0950  CALCIUM 9.1  --  9.1 8.9 9.3 9.3  MG 1.3* 2.0 2.4  --   --   --   PHOS 3.7  --  2.8  --   --   --    Sepsis Markers No results found for this basename: LATICACIDVEN, PROCALCITON, O2SATVEN,  in the last 168 hours ABG   Recent Labs Lab 07/15/14 1546 07/16/14 0414 07/20/14 1245  PHART 7.344* 7.497* 7.441  PCO2ART 77.1* 49.9* 54.8*  PO2ART 99.0 217.0* 303.0*   Liver Enzymes No results found for this basename: AST, ALT, ALKPHOS, BILITOT, ALBUMIN,  in the last 168 hours Cardiac Enzymes  Recent Labs Lab  07/14/14 1014  PROBNP 1548.0*   Glucose  Recent Labs Lab 07/20/14 0732 07/20/14 1212 07/20/14 1611 07/20/14 1945 07/21/14 0012 07/21/14 0504  GLUCAP 196* 104* 109* 165* 181* 215*   Imaging Ct Soft Tissue Neck W Contrast  07/20/2014   CLINICAL DATA:  Intubated.  Neck swelling.  EXAM: CT NECK WITH CONTRAST  TECHNIQUE: Multidetector CT imaging of the neck was performed using the standard protocol following the bolus administration of intravenous contrast.  CONTRAST:  OMNIPAQUE IOHEXOL 350 MG/ML SOLN  COMPARISON:  None.  FINDINGS: The visualized portion of the brain is unremarkable. The major vascular structures appear patent. The skullbase demonstrates bilateral mastoid effusions and pansinus disease. There is also fluid in the left middle ear cavity. Extensive mucoperiosteal thickening involving the maxillary, ethmoid and both half of the sphenoid sinus.  Endotracheal tube and NG tubes are noted. Both appear to be in good position without complicating features.  The tongue base and floor of the mouth are grossly normal. No mass or abscess is identified in the neck. The thyroid gland is normal. No neck adenopathy. The major vascular structures are patent. No superior mediastinal mass or adenopathy. The lung apices are grossly clear. The bony structures are grossly intact. Anterior cervical fusion hardware is noted.  IMPRESSION: No significant findings in the neck. The endotracheal tube and NG tubes appear normal.  No neck mass or lymphadenopathy.   Electronically Signed   By: Loralie Champagne M.D.   On: 07/20/2014 18:53   Ct Angio Chest Pe W/cm &/or Wo Cm  07/20/2014   CLINICAL DATA:  53 year old male with respiratory failure and cardiac arrest  EXAM: CT ANGIOGRAPHY CHEST WITH CONTRAST  TECHNIQUE: Multidetector CT imaging of the chest was performed using the standard protocol during bolus administration of intravenous contrast. Multiplanar CT image reconstructions and MIPs were obtained to  evaluate the vascular anatomy.  CONTRAST:  OMNIPAQUE IOHEXOL 350 MG/ML SOLN  COMPARISON:  Plain film 07/20/2014, abdomen CT 06/09/2014  FINDINGS: Chest:  Endotracheal tube terminates above the carina. Gastric tube traverses the esophagus. Collateral draining veins on the right chest wall partially filled with contrast from the contrast bolus.  No axillary adenopathy. No supraclavicular adenopathy. Unremarkable appearance of the thyroid tissue.  Heart size borderline enlarged. Surgical changes of prior median sternotomy new and coronary bypass. Calcifications of the native coronary arteries.  Vascular:  No aneurysm of the thoracic aorta.  No periaortic fluid.  No central, lobar, or segmental filling defects. The subsegmental arteries are not well evaluated secondary to attenuation of the contrast bolus and motion.  Mixed ground-glass and linear opacity throughout the lungs. Trace bilateral pleural fluid.  Abdomen:  Unremarkable appearance of the upper abdomen.  Review of the MIP images confirms the above findings.  IMPRESSION: Study is negative for pulmonary emboli, however, the distal subsegmental vessels  are not well evaluated.  Mild edema and atelectatic changes of the lungs.  Surgical changes of prior median sternotomy and CABG.  Endotracheal tube terminates above the carina.  Signed,  Yvone Neu. Loreta Ave, DO  [facility]  [facility]   Electronically Signed   By: Gilmer Mor O.D.   On: 07/20/2014 18:54   Dg Chest Port 1 View  07/20/2014   CLINICAL DATA:  Intubation, endotracheal tube placement  EXAM: PORTABLE CHEST - 1 VIEW  COMPARISON:  Portable exam 1200 hr compared to 07/19/2014  FINDINGS: Tip of endotracheal tube projects approximately 1.5 cm above carina.  Nasogastric tube extends into stomach.  Enlargement of cardiac silhouette post median sternotomy.  Mediastinal contours normal.  Pulmonary vascular congestion.  Improved pulmonary edema.  Minimal  pleural effusions bilaterally.  No pneumothorax.  IMPRESSION: Improved pulmonary edema.  Tip of endotracheal tube projects approximately 1.5 cm above carina.   Electronically Signed   By: Ulyses Southward M.D.   On: 07/20/2014 12:53   Dg Abd Portable 1v  07/20/2014   CLINICAL DATA:  Orogastric tube placement, history CHF, hypertension, diabetes, acute on chronic renal failure  EXAM: Portable exam 1204 hr compared to 07/15/2004  COMPARISON:  None.  FINDINGS: Tip of orogastric tube projects over proximal stomach.  Nonobstructive bowel gas pattern with scattered gas in nondistended large and small bowel loops.  Bones demineralized.  IMPRESSION: Tip of orogastric tube projects over proximal stomach.   Electronically Signed   By: Ulyses Southward M.D.   On: 07/20/2014 13:00     ASSESSMENT / PLAN:  PULMONARY OETT 9/14>>>9/18, 9/18>> A:  Acute respiratory failure s/p cardiopulm arrest.  Pulmonary Edema Pulmonary infiltrates R>L  > favor pulm edema but with leukocytosis PAH - noted on ECHO, PA peak 71, R/O PE Tobacco Abuse P:   Cont with MV, unable to extubate successfully.  Shortly after extubation he develops stridor and wheezing (even in the setting of steroids and nebulizers);  intubated 3 times during this admission.  Neck CT negative for swelling or masses, he has extensive sinus disease.  Plan for trach today  Titrate O2 for sats D\C heparin gtt, CTA Chest neg for PE. Plan for LE U\S given warmth and mild swelling of LLE.  He has refused cardiac cath to eval his CAD and pHTN at this time.  Patient at this time is back and forth about procedures that are life saving and procedures and will direct therapy and management of his underlying conditions.   CARDIOVASCULAR R CVL 9/14>>921 R radial Aline 9/14>>9/23 (pateint pulled out line) A:  VF/VT arrest. 10-15 minutes to ROSC  Circulatory shock - cardiogenic Pulmonary Edema  R/O PE P:  D/ced levaquin given concern for qtc changes prn IV labetalol  (discussed with cards).  CVL with 4cm migration, dc'ed>>9/21   RENAL A:   Acute on chronic renal failure (baseline scr 1.5-2.0) Hyperkalemia > improving Hypernatremia P:   Follow up BMT Lasix reduced to  qd (9/21)-watch Na & BUN Once Na starts to normalize will consider a standing dose of lasix to go home on Replace electrolytes as indicated. 9/24 patient refused blood draws in the AM, will readdress  GASTROINTESTINAL A:   Obesity  P:   Restarted TF 9/19 PPI   HEMATOLOGIC A:   Anemia  R/O PE P:  Trend CBC and coags  [place] heparin  LE Dopplers with NO DVT  INFECTIOUS A:   Empricially treated for CAP on admission CT neck\head  showed extensive sinus disease and left mastoid effusion P:   BCx2 9/14>>>ng Abx: levaquin, start date 9/14>>>9/14 ABX: doxy 9/14>>>9/18 ABX rocephin 9/14>>>9/21  ABx Unasyn 9/24>>>  Monitor fever curve / leukocytosis   CTA Chest 9/23 - Mixed ground-glass and linear opacity throughout the lungs. Trace bilateral pleural fluid.   ENDOCRINE A:   DM - on 65 bid lantus at home P:   SSI protocol, insulin gtt  NEUROLOGIC A:   Acute encephalopathy s/p cardiac arrest > improving P:   Fentanyl prn Haldol if agitated, caution with QT  GLOBAL: Patient with complex situation socially, his children are mentally disabled and her being taken care of my other individuals, his parents also do have the mental capacity to make decisions for him, leaving his sister to make medical decisions on his behalf.  Currently he is unsure about having procedures done that can assist with management and treatment of his underlying conditions.  Two such procedures are cardiac catherization and tracheostomy.  He has completely refused cardiac cath at this time.  For his tracheostomy he is unsure, however as of 9/23 with RN and sister present, he agreed for tracheostomy.    I have personally obtained a history, examined the patient, evaluated laboratory and imaging  results, formulated the assessment and plan and placed orders.  CRITICAL CARE: The patient is critically ill with multiple organ systems failure and requires high complexity decision making for assessment and support, frequent evaluation and titration of therapies, application of advanced monitoring technologies and extensive interpretation of multiple databases. Critical Care Time devoted to patient care services described in this note is .    Stephanie Acre, MD [place] [facility] Pager [phone] On Call Pager [phone]

## 2014-07-21 NOTE — Progress Notes (Signed)
Pt refusing AM labs to be drawn. Pt informed about importance of the labs and how we use them to make decisions about his care. Pt still refused. RN instructed phlebotomy to return later in the AM to try again.

## 2014-07-21 NOTE — Procedures (Signed)
Percutaneous Tracheostomy Placement  Consent from family, patient sedated, paralyzed and positioned.  Area cleaned, lidocaine/epi injected, skin incision done followed by blunt dissection, airway entered and visualized bronchoscopically, wire passed and visualized, airway crushed and dilated, size 6 cuffed shiley placed and sutured.  Visualized bronchoscopically in good position.  Patient tolerated the procedure well with minimal blood loss.  CXR ordered and pending.  Alyson Reedy, M.D. [facility] Pulmonary/Critical Care Medicine. Pager: [phone]. After hours pager: [phone].

## 2014-07-21 NOTE — Progress Notes (Signed)
PROGRESS NOTE  Subjective:   53 yo with hx  Of CAD / CABG, DM, COPD, CHF Admitted with altered mental status and hyperkalemia . He had a VF/VT cardiac arrest while in the echo lab.   Spoken with several members of the code team who were present. He was asystolic at least some part of the time. ROSC estimated at 10-15 minutes.    Has improved significantly. Echo from 9/15 shows normal LV function - EF of 65-70%. , mild-mod TR, severe pulmonary HTN with PA pressure of 71 mm Hg.   He failed extubation - he seems awake and alert. -He was able to answer questions with nodding and shaking of his head. I see that he's also been writing some notes.  He still refuses cath this am.  He is awake and alert .  Objective:    Vital Signs:   Temp:  [98.5 F (36.9 C)-99.6 F (37.6 C)] 98.5 F (36.9 C) (09/24 0344) Pulse Rate:  [66-100] 94 (09/24 0746) Resp:  [12-23] 18 (09/24 0746) BP: (102-148)/(41-84) 112/48 mmHg (09/24 0746) SpO2:  [86 %-100 %] 97 % (09/24 0746) FiO2 (%):  [40 %-100 %] 40 % (09/24 0746) Weight:  [203 lb 7.8 oz (92.3 kg)] 203 lb 7.8 oz (92.3 kg) (09/24 0436)  Last BM Date: 07/11/14   24-hour weight change: Weight change: -3 lb 4.9 oz (-1.5 kg)  Weight trends: Filed Weights   07/19/14 0500 07/20/14 0500 07/21/14 0436  Weight: 207 lb 14.3 oz (94.3 kg) 206 lb 12.7 oz (93.8 kg) 203 lb 7.8 oz (92.3 kg)    Intake/Output:  09/23 0701 - 09/24 0700 In: 2067.3 [I.V.:583.9; NG/GT:1483.3] Out: 2800 [Urine:2800]     Physical Exam: BP 112/48  Pulse 94  Temp(Src) 98.5 F (36.9 C) (Oral)  Resp 18  Ht  (1.6 m)  Wt 203 lb 7.8 oz (92.3 kg)  BMI 36.05 kg/m2  SpO2 97%  Wt Readings from Last 3 Encounters:  07/21/14 203 lb 7.8 oz (92.3 kg)  07/21/14 203 lb 7.8 oz (92.3 kg)    General: Vital signs reviewed and noted. Awake and alert   Head: Normocephalic, atraumatic.  Eyes: conjunctivae/corneas clear.  EOM's intact.   Throat: Intubated   Neck:   Thick , left  IJ in place  Lungs:  Intubated    on vent  Heart:  RR,   Abdomen:  Soft, non-tender, obese  Extremities: Left arm is diffusely edematous    Neurologic: Awake, nodded and shook head in answer to questions.   Psych: Seems appropriate    Labs: BMET:  Recent Labs  07/19/14 0950  NA 153*  K 3.7  CL 104  CO2 38*  GLUCOSE 135*  BUN 65*  CREATININE 1.07  CALCIUM 9.3    Liver function tests: No results found for this basename: AST, ALT, ALKPHOS, BILITOT, PROT, ALBUMIN,  in the last 72 hours No results found for this basename: LIPASE, AMYLASE,  in the last 72 hours  CBC:  Recent Labs  07/19/14 0950 07/20/14 1018  WBC 11.4* 12.3*  HGB 12.4* 12.6*  HCT 40.5 40.7  MCV 90.0 90.0  PLT 348 350    Cardiac Enzymes: No results found for this basename: CKTOTAL, CKMB, TROPONINI,  in the last 72 hours  Coagulation Studies:  Recent Labs  07/19/14 0409  LABPROT 16.2*  INR 1.30     Other results:  EKG :  NSR with RBBB.   Medications:    Infusions: . fentaNYL  infusion INTRAVENOUS 100 mcg/hr (07/20/14 1231)    Scheduled Medications: . antiseptic oral rinse  7 mL Mouth Rinse QID  . atorvastatin  80 mg Per Tube q1800  . chlorhexidine  15 mL Mouth Rinse BID  . etomidate  40 mg Intravenous Once  . feeding supplement (PRO-STAT SUGAR FREE 64)  60 mL Oral BID  . feeding supplement (VITAL HIGH PROTEIN)  1,000 mL Per Tube Q24H  . fentaNYL  200 mcg Intravenous Once  . free water  300 mL Per Tube Q6H  . furosemide  20 mg Intravenous Daily  . hydrALAZINE  10 mg Oral 3 times per day  . insulin aspart  0-20 Units Subcutaneous 6 times per day  . insulin glargine  40 Units Subcutaneous BID  . ipratropium-albuterol  3 mL Nebulization Q6H  . midazolam  4 mg Intravenous Once  . pantoprazole sodium  40 mg Per Tube Daily  . vecuronium  10 mg Intravenous Once    Assessment/ Plan:   Principal Problem:   Cardiac arrest Active Problems:   Acute on chronic kidney failure    Hyperkalemia   CHF (congestive heart failure)   DM (diabetes mellitus)   CAD (coronary artery disease) of artery bypass graft   HTN (hypertension)   COPD (chronic obstructive pulmonary disease)   GERD (gastroesophageal reflux disease)   Acute encephalopathy   CAP (community acquired pneumonia)  1. Cardiorespiratory arrest:  Patient was reported as having VT while in the echo lab.  He had a trivial Troponin bump ( not c/w an MI) and has normal LV function.  He was extubated and then required re-intubation yesterday.  2. CAD:  Hx of CABG in the  Past.  He may ultimately need a right and left heart cardiac catheterization.   He has refused cath  He is awake and alert.  Has had cath in the past and knows what he is saying. He is not having any angina.   He continues to refuse all procedures  Will sign off. Call for questions.    Length of Stay: 10  Vesta Mixer, Montez Hageman., MD, [facility] 07/21/2014, 8:07 AM Office [phone] Pager [phone]

## 2014-07-21 NOTE — Procedures (Signed)
Bedside Tracheostomy Insertion Procedure Note   Patient Details:   Name: Richard [name] DOB: Dec 18, 1960 MRN: [id number]  Procedure: Tracheostomy  Pre Procedure Assessment: ET Tube Size:8.0 ET Tube secured at lip (cm):22 Bite block in place: Yes Breath Sounds: Rhonch  Post Procedure Assessment: BP 136/95  Pulse 95  Temp(Src) 98.5 F (36.9 C) (Oral)  Resp 20  Ht  (1.6 m)  Wt 203 lb 7.8 oz (92.3 kg)  BMI 36.05 kg/m2  SpO2 100% O2 sats: stable throughout Complications: No apparent complications Patient did tolerate procedure well Tracheostomy Brand:Shiley Tracheostomy Style:Cuffed Tracheostomy Size: 6.0 Tracheostomy Secured NWG:[id number] and velcro Tracheostomy Placement Confirmation:Trach cuff visualized and in place and chest x-ray done  Procedure tolerated well    Ventilator setting placed ot 100% Peep  5 cn  Rate 20 vt 470 during procedure.  RT Archie Patten make aware of vent settings.    Ruthell Rummage Nanette 07/21/2014, 11:45 AM

## 2014-07-21 NOTE — Procedures (Signed)
Date: 07/21/2014,  LOC:2H03C/2H03C-01 MRN# [id number] Richard [name] Apr 21, 1961   PROCEDURE DATE: 07/21/2014   TIME: NAME:  Richard [name]  DOB:1961-04-01  MRN: [id number] LOC:  2H03C/2H03C-01    HOSP DAY:10 CODE STATUS:      Code Status Orders        Start     Ordered   07/11/14 1005  Full code   Continuous     07/11/14 1009        ATTENDING:    Indications/Preliminary Diagnosis: Diagnostic bronchoscopy and assistance of percutaneous tracheostomy  Consent: (Place X beside choice/s below)  The benefits, risks and possible complications of the procedure were        explained to:  _x__ patient  ___ patient's family  ___ other:___________  who verbalized understanding and gave:  _x__ verbal  ___ written  ___ verbal and written  ___ telephone  ___ other:________ consent.      Unable to obtain consent; procedure performed on emergent basis.     Other:    Bronchoscope Serial #:     PRESEDATION ASSESSMENT: History and Physical has been performed. Patient meds and allergies have been reviewed. Presedation airway examination has been performed and documented. Baseline vital signs, sedation score, oxygenation status, and cardiac rhythm were reviewed. Patient was deemed to be in satisfactory condition to undergo the procedure.  PREMEDICATIONS:   Sedative/Narcotic Amt Dose   Versed  mg   Fentanyl  mcg  Diprivan  mg        Airway Prep (Place X beside choice below)   1% Transtracheal Lidocaine Anesthetization 7 cc   Patient prepped per Bronchoscopy Lab Policy       Insertion Route (Place X beside choice below)   Nasal   Oral  x Endotracheal Tube   Tracheostomy   INTRAPROCEDURE MEDICATIONS:  Sedative/Narcotic Amt Dose   Versed  mg   Fentanyl  mcg  Diprivan  mg       Medication Amt Dose  Medication Amt Dose  Xylocaine 2%  cc  Epinephrine 1:10,000 sol  cc  Xylocaine 4%  cc  Cocaine  cc   TECHNICAL PROCEDURES: (Place X beside choice below)   Procedures   Description    None     Electrocautery     Cryotherapy     Balloon Dilatation     Bronchography     Stent Placement     Therapeutic Aspiration     Laser/Argon Plasma    Brachytherapy Catheter Placement    Foreign Body Removal     SPECIMENS (Sites): (Place X beside choice below)  Specimens Description  x No Specimens Obtained     Washings    Lavage    Biopsies    Fine Needle Aspirates    Brushings    Sputum    FINDINGS: Normal airway findings, no edema, no endobronchial lesions. Mild thick secretions at the carina.   ESTIMATED BLOOD LOSS:<5cc   PROCEDURE DETAILS: Timeout performed and correct patient, name, & ID confirmed. Following prep per Pulmonary policy, appropriate sedation was administered.  Airway exam proceeded with findings, technical procedures, and specimen collection as noted below. At the end of exam the scope was withdrawn without incident. Impression and Plan as noted below.   Inspection: Normal airway findings, no edema, no endobronchial lesions. Mild thick secretions at the carina.   Procedure: No biopsies obtained. Assistance for percutaneous tracheostomy  Insertion of a cuffed Shiley #6    Stephanie Acre, MD [place] [facility] Pager [phone] On Call Pager [phone]

## 2014-07-22 DIAGNOSIS — Z430 Encounter for attention to tracheostomy: Secondary | ICD-10-CM

## 2014-07-22 LAB — CBC
HCT: 41.5 % (ref 39.0–52.0)
HEMOGLOBIN: 12.8 g/dL — AB (ref 13.0–17.0)
MCH: 27.8 pg (ref 26.0–34.0)
MCHC: 30.8 g/dL (ref 30.0–36.0)
MCV: 90 fL (ref 78.0–100.0)
Platelets: 364 10*3/uL (ref 150–400)
RBC: 4.61 MIL/uL (ref 4.22–5.81)
RDW: 16.6 % — ABNORMAL HIGH (ref 11.5–15.5)
WBC: 12.2 10*3/uL — ABNORMAL HIGH (ref 4.0–10.5)

## 2014-07-22 LAB — GLUCOSE, CAPILLARY
GLUCOSE-CAPILLARY: 210 mg/dL — AB (ref 70–99)
GLUCOSE-CAPILLARY: 182 mg/dL — AB (ref 70–99)
GLUCOSE-CAPILLARY: 192 mg/dL — AB (ref 70–99)
Glucose-Capillary: 193 mg/dL — ABNORMAL HIGH (ref 70–99)
Glucose-Capillary: 88 mg/dL (ref 70–99)
Glucose-Capillary: 96 mg/dL (ref 70–99)
Glucose-Capillary: 97 mg/dL (ref 70–99)

## 2014-07-22 MED ORDER — HYDRALAZINE HCL 20 MG/ML IJ SOLN
10.0000 mg | Freq: Three times a day (TID) | INTRAMUSCULAR | Status: DC
  Administered 2014-07-22 – 2014-07-26 (×12): 10 mg via INTRAVENOUS
  Filled 2014-07-22: qty 1
  Filled 2014-07-22 (×8): qty 0.5
  Filled 2014-07-22: qty 1
  Filled 2014-07-22 (×4): qty 0.5

## 2014-07-22 NOTE — Progress Notes (Signed)
07/22/2014 Patient transfer from 2 H to 2 Central patient is alert, oriented and up with assist. He was placed on telemetry and on a 40% trach collar. Patient have skin tear on right lower leg and foam dressing on sacrum. [facility] RN.

## 2014-07-22 NOTE — Progress Notes (Signed)
Patient seen and examined with NP B. Ollis.  Lab, images, and vitals reviewed. Agree with NP B. Ollis assessment and plan with the following exceptions:  Will consult SLP for passi-muir and swallow eval.      Critical Care Time =35 mins  Stephanie Acre, MD [place] [facility] Pager [phone] On Call Pager [phone]

## 2014-07-22 NOTE — Progress Notes (Signed)
Rehab Admissions Coordinator Note:  Patient was screened by [facility] for appropriateness for an Inpatient Acute Rehab Consult.  At this time, we are recommending Inpatient Rehab consult.  Lizabeth Fellner L 07/22/2014, 11:44 AM  I can be reached at [phone].

## 2014-07-22 NOTE — Progress Notes (Signed)
 of Fentanyl wasted in sink with Sheral Apley, RN

## 2014-07-22 NOTE — Progress Notes (Signed)
eLink Physician-Brief Progress Note Patient Name: Richard [name] DOB: 1961-08-06 MRN: [id number]   Date of Service  07/22/2014  HPI/Events of Note  Hypertension, can't swallow hydralazine  eICU Interventions  Change to IV     Intervention Category Major Interventions: Hypertension - evaluation and management  MCQUAID, DOUGLAS 07/22/2014, 10:35 PM

## 2014-07-22 NOTE — Progress Notes (Signed)
Physical Therapy Treatment Patient Details Name: Richard [name] MRN: [id number] DOB: May 31, 1961 Today's Date: 07/22/2014    History of Present Illness 54 y.o. male transferred from [facility] 07/11/14 (presented to hospital with hyperkalemia and altered mental status). 07/11/14 cardiac arrest and intubated; extubated and reintubated 07/15/14. Acute on chronic renal failure. Dopplers of bil LEs negative for DVT. 9/23 CTA negative for PE. 9/24 tracheostomy  PMHx- congestive heart failure, myocardial infarction (S./P. CABG), GERD, COPD, diabetes, chronic kidney disease    PT Comments    Pt very motivated to regain independence and did very well from pulmonary perspective (did have orthostasis, however). Pt has had a prolonged period of bedrest due to respiratory issues and will benefit from PT to incr his safety and independence prior to d/c. Pt currently with functional limitations due to the deficits listed below (see PT Problem List).    Follow Up Recommendations  CIR     Equipment Recommendations  None recommended by PT    Recommendations for Other Services OT consult     Precautions / Restrictions Precautions Precautions: Fall Precaution Comments: h/o falls PTA per chart Restrictions Weight Bearing Restrictions: No    Mobility  Bed Mobility Overal bed mobility: Needs Assistance Bed Mobility: Supine to Sit     Supine to sit: Mod assist;HOB elevated     General bed mobility comments: on ICU/air mattress; step by step vc for sequencing, assist to move Legs and raise trunk due to weakness  Transfers Overall transfer level: Needs assistance Equipment used: 2 person hand held assist Transfers: Sit to/from [employer] Sit to Stand: Min assist;+2 physical assistance;+2 safety/equipment Stand pivot transfers: +2 physical assistance;Mod assist       General transfer comment: to stand x 2; remains flexed at hips due to back pain; pivotal steps with  effort and pt reaching for UE support  Ambulation/Gait                 Stairs            Wheelchair Mobility    Modified Rankin (Stroke Patients Only)       Balance Overall balance assessment: Needs assistance Sitting-balance support: No upper extremity supported;Feet supported Sitting balance-Leahy Scale: Fair     Standing balance support: Bilateral upper extremity supported Standing balance-Leahy Scale: Poor                      Cognition Arousal/Alertness: Awake/alert Behavior During Therapy: WFL for tasks assessed/performed Overall Cognitive Status: Difficult to assess                      Exercises General Exercises - Upper Extremity Shoulder Flexion: AAROM;Both;5 reps;Seated Elbow Flexion: AROM;Both;10 reps;Seated Elbow Extension: AROM;Both;10 reps Digit Composite Flexion: AROM;Both;15 reps;Seated General Exercises - Lower Extremity Ankle Circles/Pumps: AROM;20 reps;Supine;Seated Quad Sets: AROM;Both;5 reps Heel Slides: AROM;Both;5 reps;Strengthening    General Comments General comments (skin integrity, edema, etc.): Pt only on trach collar x 1 hour on arrival. Initiated supine execises to slowly gauge pt's tolerance for activity. Upon sitting EOB, pt with orthostasis and performed additonal exercises to assist with BP support.      Pertinent Vitals/Pain On trach collar 40% FiO2 with RR 15-24 BP 149/72 supine        125/68 sitting (with dizziness)        133/69 sitting after exercises (dizziness subsided)  See flowsheet for pain documentation       Home Living  Prior Function            PT Goals (current goals can now be found in the care plan section) Acute Rehab PT Goals Patient Stated Goal: unable to state; nods he wants to get movign more Time For Goal Achievement: 08/01/14 Progress towards PT goals: Progressing toward goals    Frequency  Min 3X/week    PT Plan Discharge plan  needs to be updated    Co-evaluation             End of Session Equipment Utilized During Treatment: Gait belt;Oxygen Activity Tolerance: Patient tolerated treatment well Patient left: in chair;with call bell/phone within reach;with chair alarm set     Time: 1610-9604 PT Time Calculation (min): 30 min  Charges:  $Therapeutic Exercise: 8-22 mins $Therapeutic Activity: 8-22 mins                    G Codes:      Larenda Reedy 08-16-2014, 10:07 AM Pager [phone]

## 2014-07-22 NOTE — Progress Notes (Signed)
PULMONARY / CRITICAL CARE MEDICINE   Name: Richard [name] MRN: [id number] DOB: 1961/09/26    ADMISSION DATE:  07/11/2014 CONSULTATION DATE: 9/14  REFERRING MD :  Benjamine Mola  CHIEF COMPLAINT:  Cardiac arrest.   INITIAL PRESENTATION:  53 year old male transferred [facility] [place] on 9/14 w/ working dx of acute on chronic renal failure, hyperkalemia, encephalopathy and possible CAP. He was in ECHO when suddenly developed VF/VT arrest. ROSC estimated at 10-15 minutes. PCCM asked to assist w/ care s/p arrest.   STUDIES:  9/14 ECHO limited >> stopped due to cardiac arrest 9/15 ECHO >> EF 65-70%, no rwma, RV mildly dilated, PA peak 71 9/14 renal US >> no hydro, 5 mm non-obs renal calculi in R lower pole, small ascites in RUQ 9/19 LE Doppler >>no DVT 9/23 CTA Chest>>no PE, Mixed ground-glass and linear opacity throughout the lungs. Trace bilateral pleural fluid.9/23 CT Neck>>no masses.  Has left mastoid effusion, and sinus disease  SIGNIFICANT EVENTS: 9/14  Admitted w/ acute on chronic renal failure, hyperkalemia and encephalopathy  9/14  Cardiac arrest (VF/VT) while in echo 9/18  Failed extubation with immediate need for re-intubation, started on Steroids 9/23  Extubated>>>developed stridor and wheezing, reintubated within 2 hours of extubation 9/24  Percutaneous tracheostomy (Dr. Molli Knock).  Pt refused lab draws.   9/25  Weaning on ATC 40%, no distress  SUBJECTIVE:  Pt up to chair, requesting water to drink.  No distress on ATC 40%.  Note that he has been refusing aspects of care (labs etc) with mild intermittent agitation.   VITAL SIGNS: Temp:  [98.2 F (36.8 C)-100 F (37.8 C)] 98.2 F (36.8 C) (09/25 0900) Pulse Rate:  [64-94] 89 (09/25 1300) Resp:  [16-27] 24 (09/25 1300) BP: (111-157)/(52-78) 136/72 mmHg (09/25 1300) SpO2:  [94 %-100 %] 94 % (09/25 1300) FiO2 (%):  [40 %] 40 % (09/25 1218) Weight:  [208 lb 5.4 oz (94.5 kg)] 208 lb 5.4 oz (94.5 kg) (09/25  0300)  HEMODYNAMICS:    VENTILATOR SETTINGS: Vent Mode:  [-] PRVC FiO2 (%):  [40 %] 40 % Set Rate:  [20 bmp] 20 bmp Vt Set:  [460 mL] 460 mL PEEP:  [5 cmH20] 5 cmH20 Plateau Pressure:  [18 cmH20-20 cmH20] 18 cmH20  INTAKE / OUTPUT:  Intake/Output Summary (Last 24 hours) at 07/22/14 1430 Last data filed at 07/22/14 1300  Gross per 24 hour  Intake   1920 ml  Output   1050 ml  Net    870 ml   PHYSICAL EXAMINATION: General: Obese male, in NAD  Neuro: awake, alert, follows commands HEENT No clear JVD Cardiovascular: Distant, RRR, Nl S1/S2, -M/R/G. Lungs:  resp's even/non-labored on 40%ATC, lungs bilaterally distant but clear Abdomen: Soft, non-tender. + bowel sounds obese Musculoskeletal:  Intact  Skin:  LE edema improving, mild erythema and warmth to left LE and LUE   LABS:  CBC  Recent Labs Lab 07/20/14 1018 07/21/14 1050 07/22/14 0355  WBC 12.3* 12.3* 12.2*  HGB 12.6* 14.2 12.8*  HCT 40.7 45.5 41.5  PLT 350 370 364     Coag's  Recent Labs Lab 07/19/14 0409 07/21/14 1050  APTT  --  26  INR 1.30  --    BMET  Recent Labs Lab 07/18/14 0413 07/19/14 0950 07/21/14 1050  NA 155* 153* 148*  K 3.9 3.7 3.9  CL 106 104 102  CO2 39* 38* 33*  BUN 71* 65* 44*  CREATININE 1.08 1.07 0.93  GLUCOSE 161* 135* 99  Electrolytes  Recent Labs Lab 07/16/14 0422  07/18/14 0413 07/19/14 0950 07/21/14 1050  CALCIUM 9.1  < > 9.3 9.3 9.5  MG 2.4  --   --   --   --   PHOS 2.8  --   --   --   --   < > = values in this interval not displayed. Sepsis Markers No results found for this basename: LATICACIDVEN, PROCALCITON, O2SATVEN,  in the last 168 hours ABG   Recent Labs Lab 07/15/14 1546 07/16/14 0414 07/20/14 1245  PHART 7.344* 7.497* 7.441  PCO2ART 77.1* 49.9* 54.8*  PO2ART 99.0 217.0* 303.0*   Liver Enzymes No results found for this basename: AST, ALT, ALKPHOS, BILITOT, ALBUMIN,  in the last 168 hours  Cardiac Enzymes No results found for this  basename: TROPONINI, PROBNP,  in the last 168 hours  Glucose  Recent Labs Lab 07/21/14 1539 07/21/14 2024 07/21/14 2330 07/22/14 0339 07/22/14 0813 07/22/14 1135  GLUCAP 123* 166* 193* 210* 182* 192*   Imaging Dg Chest Port 1 View  07/21/2014   CLINICAL DATA:  Status post tracheostomy and nasogastric tube placement  EXAM: PORTABLE CHEST - 1 VIEW  COMPARISON:  Portable chest x-ray of July 20, 2014  FINDINGS: The tracheostomy appliance tip lies at the level of the clavicular heads. This is 5.5 above the crotch of the carina. The esophagogastric tube tip projects off the inferior margin of the image. The proximal port lies at the level of the GE junction. The cardiac silhouette is mildly enlarged though stable. The retrocardiac region on the left remains dense. The left hemidiaphragm is less well demonstrated today. The perihilar interstitial markings remain increased.  IMPRESSION: 1. The tracheostomy appliance is in appropriate position radiographically. 2. The esophagogastric tube's proximal port is at the level of the GE junction. Advancement by 5-10 cm is recommended. 3. There is further obscuration of the left hemidiaphragm likely secondary to left lower lobe atelectasis or pneumonia.   Electronically Signed   By: David  [place]   On: 07/21/2014 12:04     ASSESSMENT / PLAN:  PULMONARY OETT 9/14>>>9/18, 9/18>>9/24 Janina Mayo 9/24 >>  A:  Acute respiratory failure s/p cardiopulm arrest.  Pulmonary Edema Pulmonary infiltrates R>L  > favor pulm edema but with leukocytosis PAH - noted on ECHO, PA peak 71, R/O PE Tobacco Abuse PE - ruled out, CTA chest negative, LE dopplers negative P:   ATC wean as tolerated Wean oxygen for sats 90-95% Pulmonary hygiene:  Mobilize as able  He has refused cardiac cath to eval his CAD and pHTN at this time.  Patient at this time is back and forth about procedures that are life saving and procedures and will direct therapy and management of his  underlying conditions.   CARDIOVASCULAR R CVL 9/14>>9/21 R radial Aline 9/14>>9/23 (patient pulled out line) A:  VF/VT arrest. 10-15 minutes to ROSC  Circulatory shock - cardiogenic, resolved.   Pulmonary Edema  R/O PE - see above P:  PRN IV labetalol (discussed with cards).  Lasix as below  RENAL A:   Acute on chronic renal failure (baseline scr 1.5-2.0) Hyperkalemia > improving Hypernatremia P:   Follow up BMP Lasix reduced to  qd (9/21)-watch Na & BUN Once Na normalized, consider a standing dose of lasix for discharge Replace electrolytes as indicated. 9/24 patient refused blood draws in the AM, will re-address  GASTROINTESTINAL A:   Obesity  Dysphagia  P:   Restarted TF 9/19 PPI  SLP eval  HEMATOLOGIC  A:   Anemia  R/O PE, DVT - ruled out.   P:  Trend CBC and coags  Green heparin   INFECTIOUS A:   Empricially treated for CAP on admission Sinusitis - CT neck\head showed extensive sinus disease and left mastoid effusion P:   BCx2 9/14>>>neg Abx: levaquin, start date 9/14>>>9/14 ABX: doxy 9/14>>>9/18          rocephin 9/14>>>9/21            Unasyn 9/24>>>  Monitor fever curve / leukocytosis    ENDOCRINE A:   DM - on 65 bid lantus at home P:   SSI protocol  NEUROLOGIC A:   Acute encephalopathy s/p cardiac arrest > improving P:   Fentanyl prn Haldol if agitated, caution with QT  GLOBAL: Patient with complex situation socially, his children are mentally disabled and her being taken care of my other individuals, his parents also do have the mental capacity to make decisions for him, leaving his sister to make medical decisions on his behalf.  Currently he is unsure about having procedures done that can assist with management and treatment of his underlying conditions.  Including such procedures as cardiac catherization.    He has completely refused cardiac cath at this time.  He did agree to tracheostomy and is now weaning on ATC 40%. Likely will  need SNF placement.   Canary Brim, NP-C [place] Pgr: [phone] or [phone]   I have personally obtained a history, examined the patient, evaluated laboratory and imaging results, formulated the assessment and plan and placed orders.  CRITICAL CARE: The patient is critically ill with multiple organ systems failure and requires high complexity decision making for assessment and support, frequent evaluation and titration of therapies, application of advanced monitoring technologies and extensive interpretation of multiple databases. Critical Care Time devoted to patient care services described in this note is __minutes.

## 2014-07-22 NOTE — Progress Notes (Signed)
NUTRITION FOLLOW UP  Intervention:   Vital High Protein @ 40 ml/hr via OG tube  60 ml Prostat BID.   Tube feeding regimen provides 1360 kcal (69% of needs), 144 grams of protein, and 802 ml of H2O.   Nutrition Dx:   Inadequate oral intake related to inability to eat as evidenced by NPO status; ongoing.   Goal:   Enteral nutrition to provide 60-70% of estimated calorie needs (22-25 kcals/kg ideal body weight) and 100% of estimated protein needs, based on ASPEN guidelines for permissive underfeeding in critically ill obese individuals; met.  Monitor:   Respiratory status, TF initiation and tolerance, weight trends, labs  Assessment:   Pt transferred from [facility] with acute on chronic renal failure with hyperkalemia.  - Pt failed extubation on 9/18 and was immediately re-intubated. - Trach placed 9/24  Patient just changed to trach collar (1 hr) RN unsure at this time if pt will remain on trach collar) MV: 9.7 L/min Temp (24hrs), Avg:99.2 F (37.3 C), Min:98.9 F (37.2 C), Max:100 F (37.8 C)  Free water: 300 ml every 8 hours Total free water: 1702 ml  Height: Ht Readings from Last 1 Encounters:  07/11/14 $RemoveB'5\' 3"'[id number]$  (1.6 m)    Weight Status:   Wt Readings from Last 1 Encounters:  07/22/14 208 lb 5.4 oz (94.5 kg)    Re-estimated needs:  Kcal: 1962 Protein: >/= 140 g Fluid: Per MD  Skin: intact  Diet Order: NPO   Intake/Output Summary (Last 24 hours) at 07/22/14 0911 Last data filed at 07/22/14 0800  Gross per 24 hour  Intake 1684.33 ml  Output   1800 ml  Net -115.67 ml    Last BM: 9/14 - discussed with RN who plans to get order for meds.    Labs:   Recent Labs Lab 07/15/14 1200 07/16/14 0422  07/18/14 0413 07/19/14 0950 07/21/14 1050  NA  --  151*  < > 155* 153* 148*  K  --  4.3  < > 3.9 3.7 3.9  CL  --  100  < > 106 104 102  CO2  --  36*  < > 39* 38* 33*  BUN  --  83*  < > 71* 65* 44*  CREATININE  --  1.47*  < > 1.08 1.07 0.93  CALCIUM   --  9.1  < > 9.3 9.3 9.5  MG 2.0 2.4  --   --   --   --   PHOS  --  2.8  --   --   --   --   GLUCOSE  --  166*  < > 161* 135* 99  < > = values in this interval not displayed.  CBG (last 3)   Recent Labs  07/21/14 2330 07/22/14 0339 07/22/14 0813  GLUCAP 193* 210* 182*    Scheduled Meds: . ampicillin-sulbactam (UNASYN) IV  1.5 g Intravenous Q6H  . antiseptic oral rinse  7 mL Mouth Rinse QID  . atorvastatin  80 mg Per Tube q1800  . chlorhexidine  15 mL Mouth Rinse BID  . feeding supplement (PRO-STAT SUGAR FREE 64)  60 mL Oral BID  . feeding supplement (VITAL HIGH PROTEIN)  1,000 mL Per Tube Q24H  . free water  300 mL Per Tube Q6H  . furosemide  20 mg Intravenous Daily  . heparin subcutaneous  5,000 Units Subcutaneous 3 times per day  . hydrALAZINE  10 mg Oral 3 times per day  . insulin aspart  0-20 Units Subcutaneous 6 times per day  . insulin glargine  40 Units Subcutaneous BID  . pantoprazole sodium  40 mg Per Tube Daily    Continuous Infusions: . fentaNYL infusion INTRAVENOUS Stopped (07/21/14 0800)    [place], [place], [place] Pager [phone] After Hours Pager

## 2014-07-22 NOTE — Progress Notes (Signed)
Attempted to call pt sister Arline Asp and update on pt transfer. No answer left message to call unit.

## 2014-07-23 ENCOUNTER — Inpatient Hospital Stay (HOSPITAL_COMMUNITY): Payer: Medicaid Other

## 2014-07-23 LAB — CBC
HCT: 48.7 % (ref 39.0–52.0)
Hemoglobin: 15.4 g/dL (ref 13.0–17.0)
MCH: 29.1 pg (ref 26.0–34.0)
MCHC: 31.6 g/dL (ref 30.0–36.0)
MCV: 92.1 fL (ref 78.0–100.0)
PLATELETS: 351 10*3/uL (ref 150–400)
RBC: 5.29 MIL/uL (ref 4.22–5.81)
RDW: 16.6 % — AB (ref 11.5–15.5)
WBC: 17.7 10*3/uL — ABNORMAL HIGH (ref 4.0–10.5)

## 2014-07-23 LAB — BASIC METABOLIC PANEL
Anion gap: 18 — ABNORMAL HIGH (ref 5–15)
BUN: 42 mg/dL — AB (ref 6–23)
CALCIUM: 9.6 mg/dL (ref 8.4–10.5)
CHLORIDE: 106 meq/L (ref 96–112)
CO2: 27 mEq/L (ref 19–32)
CREATININE: 1.05 mg/dL (ref 0.50–1.35)
GFR, EST NON AFRICAN AMERICAN: 79 mL/min — AB (ref >90)
Glucose, Bld: 97 mg/dL (ref 70–99)
Potassium: 3.9 mEq/L (ref 3.7–5.3)
Sodium: 151 mEq/L — ABNORMAL HIGH (ref 137–147)

## 2014-07-23 LAB — GLUCOSE, CAPILLARY
GLUCOSE-CAPILLARY: 106 mg/dL — AB (ref 70–99)
GLUCOSE-CAPILLARY: 399 mg/dL — AB (ref 70–99)
Glucose-Capillary: 121 mg/dL — ABNORMAL HIGH (ref 70–99)
Glucose-Capillary: 123 mg/dL — ABNORMAL HIGH (ref 70–99)
Glucose-Capillary: 374 mg/dL — ABNORMAL HIGH (ref 70–99)

## 2014-07-23 MED ORDER — PANTOPRAZOLE SODIUM 40 MG PO TBEC
40.0000 mg | DELAYED_RELEASE_TABLET | Freq: Every day | ORAL | Status: DC
  Administered 2014-07-23 – 2014-07-26 (×4): 40 mg via ORAL
  Filled 2014-07-23 (×4): qty 1

## 2014-07-23 MED ORDER — RESOURCE THICKENUP CLEAR PO POWD
ORAL | Status: DC | PRN
  Filled 2014-07-23: qty 125

## 2014-07-23 NOTE — Progress Notes (Signed)
ATC collar and set-up changed.

## 2014-07-23 NOTE — Progress Notes (Signed)
PULMONARY / CRITICAL CARE MEDICINE   Name: Richard [name] MRN: [id number] DOB: Sep 29, 1961    ADMISSION DATE:  07/11/2014 CONSULTATION DATE: 9/14  REFERRING MD :  Benjamine Mola  CHIEF COMPLAINT:  Cardiac arrest.   INITIAL PRESENTATION:  53 year old male transferred [facility] [place] on 9/14 w/ working dx of acute on chronic renal failure, hyperkalemia, encephalopathy and possible CAP. He was in ECHO when suddenly developed VF/VT arrest. ROSC estimated at 10-15 minutes. PCCM asked to assist w/ care s/p arrest.   STUDIES:  9/14 ECHO limited >> stopped due to cardiac arrest 9/15 ECHO >> EF 65-70%, no rwma, RV mildly dilated, PA peak 71 9/14 renal US >> no hydro, 5 mm non-obs renal calculi in R lower pole, small ascites in RUQ 9/19 LE Doppler >>no DVT 9/23 CTA Chest>>no PE, Mixed ground-glass and linear opacity throughout the lungs. Trace bilateral pleural fluid.9/23 CT Neck>>no masses.  Has left mastoid effusion, and sinus disease  SIGNIFICANT EVENTS: 9/14  Admitted w/ acute on chronic renal failure, hyperkalemia and encephalopathy  9/14  Cardiac arrest (VF/VT) while in echo 9/18  Failed extubation with immediate need for re-intubation, started on Steroids 9/23  Extubated>>>developed stridor and wheezing, reintubated within 2 hours of extubation 9/24  Percutaneous tracheostomy (Dr. Molli Knock).  Pt refused lab draws.   9/25  Weaning on ATC 40%, no distress  SUBJECTIVE:  Pt up to chair,   No distress on ATC 40%.  Note that he has been refusing aspects of care (labs etc) with mild intermittent agitation. Shakes his head to indicate breathing "not ok", but unlabored and oxygenating well.  VITAL SIGNS: Temp:  [98.8 F (37.1 C)-99.5 F (37.5 C)] 98.8 F (37.1 C) (09/26 0423) Pulse Rate:  [81-100] 100 (09/26 0733) Resp:  [20-29] 23 (09/26 0733) BP: (108-147)/(53-93) 124/88 mmHg (09/26 0640) SpO2:  [91 %-100 %] 97 % (09/26 0733) FiO2 (%):  [35 %-40 %] 35 % (09/26 0733) Weight:  [94.167 kg (207  lb 9.6 oz)] 94.167 kg (207 lb 9.6 oz) (09/26 0423)  HEMODYNAMICS:    VENTILATOR SETTINGS: Vent Mode:  [-]  FiO2 (%):  [35 %-40 %] 35 %  INTAKE / OUTPUT:  Intake/Output Summary (Last 24 hours) at 07/23/14 4098 Last data filed at 07/23/14 0800  Gross per 24 hour  Intake    530 ml  Output   1550 ml  Net  -1020 ml   PHYSICAL EXAMINATION: General: Obese male, in NAD  Neuro: awake, alert, follows commands HEENT No clear JVD Cardiovascular: Distant, RRR, Nl S1/S2, -M/R/G. Sternal incision scar Lungs:  resp's even/non-labored on 40%ATC, lungs bilaterally distant but clear Abdomen: Soft, non-tender. + bowel sounds, obese Musculoskeletal:  Intact  Skin:  LE edema, slight, mild erythema and warmth to left LE and LUE   LABS:  CBC  Recent Labs Lab 07/21/14 1050 07/22/14 0355 07/23/14 0340  WBC 12.3* 12.2* 17.7*  HGB 14.2 12.8* 15.4  HCT 45.5 41.5 48.7  PLT 370 364 351     Coag's  Recent Labs Lab 07/19/14 0409 07/21/14 1050  APTT  --  26  INR 1.30  --    BMET  Recent Labs Lab 07/19/14 0950 07/21/14 1050 07/23/14 0340  NA 153* 148* 151*  K 3.7 3.9 3.9  CL 104 102 106  CO2 38* 33* 27  BUN 65* 44* 42*  CREATININE 1.07 0.93 1.05  GLUCOSE 135* 99 97   Electrolytes  Recent Labs Lab 07/19/14 0950 07/21/14 1050 07/23/14 0340  CALCIUM  9.3 9.5 9.6   Sepsis Markers No results found for this basename: LATICACIDVEN, PROCALCITON, O2SATVEN,  in the last 168 hours ABG   Recent Labs Lab 07/20/14 1245  PHART 7.441  PCO2ART 54.8*  PO2ART 303.0*   Liver Enzymes No results found for this basename: AST, ALT, ALKPHOS, BILITOT, ALBUMIN,  in the last 168 hours  Cardiac Enzymes No results found for this basename: TROPONINI, PROBNP,  in the last 168 hours  Glucose  Recent Labs Lab 07/22/14 1135 07/22/14 1554 07/22/14 2041 07/22/14 2328 07/23/14 0421 07/23/14 0809  GLUCAP 192* 88 96 97 106* 121*   Imaging No results found.   ASSESSMENT /  PLAN:  PULMONARY OETT 9/14>>>9/18, 9/18>>9/24 Janina Mayo 9/24 >>  A:  Acute respiratory failure s/p cardiopulm arrest.  Pulmonary Edema Pulmonary infiltrates R>L  > favor pulm edema but with leukocytosis increased as of 9/26/ afebrile. CXR stable w/o acute infiltrate PAH - noted on ECHO, PA peak 71, R/O PE Tobacco Abuse PE - ruled out, CTA chest negative, LE dopplers negative P:   ATC wean as tolerated Wean oxygen for sats 90-95% Pulmonary hygiene:  Mobilize as able  He has refused cardiac cath to eval his CAD and pHTN at this time.  Patient at this time is back and forth about procedures that are life saving and procedures and will direct therapy and management of his underlying conditions.   CARDIOVASCULAR R CVL 9/14>>9/21 R radial Aline 9/14>>9/23 (patient pulled out line) A:  VF/VT arrest. 10-15 minutes to ROSC  Circulatory shock - cardiogenic, resolved.   Pulmonary Edema  R/O PE - see above P:  PRN IV labetalol (discussed with cards).  Lasix as below  RENAL A:   Acute on chronic renal failure (baseline scr 1.5-2.0) Hyperkalemia > improving Hypernatremia 151 on 9/26 P:   Follow up BMP Lasix reduced to  qd (9/21)-watch Na & BUN Once Na normalized, consider a standing dose of lasix for discharge Replace electrolytes as indicated. 9/24 patient refused blood draws in the AM, will re-address  GASTROINTESTINAL A:   Obesity  Dysphagia  P:   Restarted TF 9/19 PPI  SLP eval  HEMATOLOGIC A:   Anemia  R/O PE, DVT - ruled out.   P:  Trend CBC and coags  Shirley heparin   INFECTIOUS A:   Empricially treated for CAP on admission Sinusitis - CT neck\head showed extensive sinus disease and left mastoid effusion P:   BCx2 9/14>>>neg Abx: levaquin, start date 9/14>>>9/14 ABX: doxy 9/14>>>9/18          rocephin 9/14>>>9/21            Unasyn 9/24>>>  Monitor fever curve / leukocytosis    ENDOCRINE A:   DM - on 65 bid lantus at home P:   SSI  protocol  NEUROLOGIC A:   Acute encephalopathy s/p cardiac arrest > improving by hx P:   Fentanyl prn Haldol if agitated, caution with QT  GLOBAL: Patient with complex situation socially, his children are mentally disabled and are being taken care of by other individuals, his parents also do have the mental capacity to make decisions for him, leaving his sister to make medical decisions on his behalf.  Currently he is unsure about having procedures done that can assist with management and treatment of his underlying conditions.  Including such procedures as cardiac catherization.    He has completely refused cardiac cath at this time.  He did agree to tracheostomy and is now weaning on ATC 40%.  Likely will need SNF placement.   CD Maple Hudson, MD [facility] Pgr: [phone] or [phone]

## 2014-07-23 NOTE — Progress Notes (Signed)
Patient refusing suctioning at this time even though breath sounds are rhonchi.  Was asked more than once and patient still refused.  RT will continue to monitor.

## 2014-07-23 NOTE — Progress Notes (Signed)
Patient stated he did not need suctioning at this time.  RT will continue to monitor.

## 2014-07-23 NOTE — Evaluation (Signed)
Clinical/Bedside Swallow Evaluation Patient Details  Name: Richard [name] MRN: [id number] Date of Birth: 09-21-1961  Today's Date: 07/23/2014 Time: 1000-1108 SLP Time Calculation (min): 68 min  Past Medical History:  Past Medical History  Diagnosis Date  . CAD (coronary artery disease) of artery bypass graft 07/11/2014  . Cardiac arrest 07/11/2014  . CHF (congestive heart failure) 07/11/2014  . HTN (hypertension) 07/11/2014  . COPD (chronic obstructive pulmonary disease) 07/11/2014  . DM (diabetes mellitus) 07/11/2014  . GERD (gastroesophageal reflux disease) 07/11/2014  . Acute on chronic kidney failure 07/11/2014   Past Surgical History: No past surgical history on file. HPI:  53 y.o. male transferred from [facility] 07/11/14 (presented to hospital with hyperkalemia and altered mental status). 07/11/14 cardiac arrest and intubated; extubated and reintubated 07/15/14.  Trach 9/24. Acute on chronic renal failure.  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 CABG), GERD, COPD, diabetes, chronic kidney disease    Assessment / Plan / Recommendation Clinical Impression  Pt demonstrates evidence of an acute reversible dysphagia, likely secondary to prolonged intubation with decreased airway protection and sensation rather than trach. Pt with slight delay in swallow initiation with delayed cough with thin liquids. Pt allowed to drink 16 oz of nectar thick liquids but no evidence of aspiration noted other than occasional slight wet vocal quality, though expect this may also be due to secretions. Pt unwilling to participate in FEES (body habitus too large for MBS) so confirmation of tolerance cannot be obtained. He will consent to short term nectar thick liquids with expectation of improved airway protection over a few days. Pt will start a dys 2 nectar thick diet with PMSV always in place with full supervision. Aspiration risk moderate, MD aware. Will follow for tolerance.      Aspiration Risk  Moderate    Diet Recommendation Dysphagia 2 (Fine chop);Nectar-thick liquid   Liquid Administration via: Cup Medication Administration: Whole meds with puree Supervision: Patient able to self feed Compensations: Slow rate;Small sips/bites Postural Changes and/or Swallow Maneuvers: Seated upright 90 degrees;Out of bed for meals    Other  Recommendations Oral Care Recommendations: Oral care BID Other Recommendations: Order thickener from pharmacy;Have oral suction available;Place PMSV during PO intake   Follow Up Recommendations  Other (comment) (TBD)    Frequency and Duration min 2x/week  2 weeks   Pertinent Vitals/Pain NA    SLP Swallow Goals     Swallow Study Prior Functional Status       General HPI: 53 y.o. male transferred from [facility] 07/11/14 (presented to hospital with hyperkalemia and altered mental status). 07/11/14 cardiac arrest and intubated; extubated and reintubated 07/15/14.  Trach 9/24. Acute on chronic renal failure.  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 CABG), GERD, COPD, diabetes, chronic kidney disease  Type of Study: Bedside swallow evaluation Previous Swallow Assessment: none Diet Prior to this Study: NPO Temperature Spikes Noted: No Respiratory Status: Trach collar Trach Size and Type: Cuff;#6;Deflated;With PMSV in place History of Recent Intubation: Yes Length of Intubations (days): 10 days Date extubated: 07/21/14 Behavior/Cognition: Alert Oral Cavity - Dentition: Edentulous Self-Feeding Abilities: Able to feed self Patient Positioning: Upright in chair Baseline Vocal Quality: Hoarse;Low vocal intensity Volitional Cough: Strong Volitional Swallow: Able to elicit    Oral/Motor/Sensory Function Overall Oral Motor/Sensory Function: Appears within functional limits for tasks assessed   Ice Chips     Thin Liquid Thin Liquid: Impaired Presentation: Cup;Self Fed Pharyngeal  Phase Impairments:  Suspected delayed Swallow;Cough - Delayed  Nectar Thick Nectar Thick Liquid: Impaired Presentation: Cup;Self Fed Pharyngeal Phase Impairments: Suspected delayed Swallow   Honey Thick Honey Thick Liquid: Not tested   Puree Puree: Within functional limits   Solid   GO    Solid: Not tested      Harlon Ditty, MA CCC-SLP [phone]  Claudine Mouton 07/23/2014,11:44 AM

## 2014-07-23 NOTE — Evaluation (Signed)
[facility]y-Muir Speaking Valve - Evaluation Patient Details  Name: CORGAN MORMILE MRN: [id number] Date of Birth: 1961/07/01  Today's Date: 07/23/2014 Time: 1000-1108 SLP Time Calculation (min): 68 min  Past Medical History:  Past Medical History  Diagnosis Date  . CAD (coronary artery disease) of artery bypass graft 07/11/2014  . Cardiac arrest 07/11/2014  . CHF (congestive heart failure) 07/11/2014  . HTN (hypertension) 07/11/2014  . COPD (chronic obstructive pulmonary disease) 07/11/2014  . DM (diabetes mellitus) 07/11/2014  . GERD (gastroesophageal reflux disease) 07/11/2014  . Acute on chronic kidney failure 07/11/2014   Past Surgical History: No past surgical history on file. HPI:  53 y.o. male transferred from [facility] 07/11/14 (presented to hospital with hyperkalemia and altered mental status). 07/11/14 cardiac arrest and intubated; extubated and reintubated 07/15/14.  Trach 9/24. Acute on chronic renal failure.  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 CABG), GERD, COPD, diabetes, chronic kidney disease    Assessment / Plan / Recommendation Clinical Impression  Pt demosntrates very good tolerance of PMSV with mild discomfort and expiratiory pressure building after 30 minutes of wear time. Pt immediately able to redirect air to upper airway, no audible air trapping noted and vital signs remained excellent throughout trials.  Moderate verbal cues need for increased volume and breath support at phrase length. As length of wear time extended during swallow eval, pt did report subjective difficulty exhaling, but this was mild. Pt also not yet ready to participate in education regarding precautions and placement. For this reason, recommend pt wear PMSV with full to close intermittent supervision from RN. Pt must also wear PMSV during all PO intake (see swallow eval). SLP will follow for tolerance and to progress with independence.     SLP Assessment  Patient needs  continued Speech Lanaguage Pathology Services    Follow Up Recommendations  Other (comment) (TBD)    Frequency and Duration min 2x/week  2 weeks   Pertinent Vitals/Pain NA    SLP Goals Potential to Achieve Goals: Good   PMSV Trial  PMSV was placed for: 45 minutes Able to redirect subglottic air through upper airway: Yes Able to Attain Phonation: Yes Voice Quality: Low vocal intensity;Hoarse Able to Expectorate Secretions: Yes Level of Secretion Expectoration with PMSV: Oral Breath Support for Phonation: Mildly decreased Intelligibility: Intelligibility reduced Word: 75-100% accurate Phrase: 50-74% accurate Sentence: 25-49% accurate Conversation: 25-49% accurate Respirations During Trial: 20 SpO2 During Trial: 100 % Pulse During Trial: 111 Behavior: Alert;Cooperative   Tracheostomy Tube       Vent Dependency  Vent Dependent: No FiO2 (%): 35 %    Cuff Deflation Trial Tolerated Cuff Deflation: Yes Length of Time for Cuff Deflation Trial: on hour Behavior: Alert;Cooperative Cuff Deflation Trial - Comments: initial coughing, then calm, stable   Wyvonne Carda, Riley Nearing 07/23/2014, 11:34 AM

## 2014-07-24 LAB — CBC
HEMATOCRIT: 40.9 % (ref 39.0–52.0)
Hemoglobin: 12.9 g/dL — ABNORMAL LOW (ref 13.0–17.0)
MCH: 28.1 pg (ref 26.0–34.0)
MCHC: 31.5 g/dL (ref 30.0–36.0)
MCV: 89.1 fL (ref 78.0–100.0)
Platelets: 363 10*3/uL (ref 150–400)
RBC: 4.59 MIL/uL (ref 4.22–5.81)
RDW: 16.9 % — AB (ref 11.5–15.5)
WBC: 14.7 10*3/uL — ABNORMAL HIGH (ref 4.0–10.5)

## 2014-07-24 LAB — GLUCOSE, CAPILLARY
GLUCOSE-CAPILLARY: 147 mg/dL — AB (ref 70–99)
GLUCOSE-CAPILLARY: 215 mg/dL — AB (ref 70–99)
GLUCOSE-CAPILLARY: 232 mg/dL — AB (ref 70–99)
GLUCOSE-CAPILLARY: 292 mg/dL — AB (ref 70–99)
Glucose-Capillary: 301 mg/dL — ABNORMAL HIGH (ref 70–99)
Glucose-Capillary: 395 mg/dL — ABNORMAL HIGH (ref 70–99)

## 2014-07-24 NOTE — Progress Notes (Signed)
PULMONARY / CRITICAL CARE MEDICINE   Name: Richard [name] MRN: [id number] DOB: 01-Apr-1961    ADMISSION DATE:  07/11/2014 CONSULTATION DATE: 9/14  REFERRING MD :  Benjamine Mola  CHIEF COMPLAINT:  Cardiac arrest.   INITIAL PRESENTATION:  53 year old male [facility] [place] on [facility]/14 w/ working dx of acute on chronic renal failure, hyperkalemia, encephalopathy and possible CAP. He was in ECHO when suddenly developed VF/VT arrest. ROSC estimated at 10-15 minutes. PCCM asked to assist w/ care s/p arrest.   STUDIES:  9/14 ECHO limited >> stopped due to cardiac arrest 9/15 ECHO >> EF 65-70%, no rwma, RV mildly dilated, PA peak 71 9/14 renal US >> no hydro, 5 mm non-obs renal calculi in R lower pole, small ascites in RUQ 9/19 LE Doppler >>no DVT 9/23 CTA Chest>>no PE, Mixed ground-glass and linear opacity throughout the lungs. Trace bilateral pleural fluid.9/23 CT Neck>>no masses.  Has left mastoid effusion, and sinus disease  SIGNIFICANT EVENTS: 9/14  Admitted w/ acute on chronic renal failure, hyperkalemia and encephalopathy  9/14  Cardiac arrest (VF/VT) while in echo 9/18  Failed extubation with immediate need for re-intubation, started on Steroids 9/23  Extubated>>>developed stridor and wheezing, reintubated within 2 hours of extubation 9/24  Percutaneous tracheostomy (Dr. Molli Knock).  Pt refused lab draws.   9/25  Weaning on ATC 40%, no distress  SUBJECTIVE:  Pt up to chair,   No distress on ATC 28%.  Note that he has been refusing aspects of care (labs etc) with mild intermittent agitation. More cooperative and interactive today. Tolerating Dysphagia 2 diet/ PMV.  VITAL SIGNS: Temp:  [98 F (36.7 C)-99.3 F (37.4 C)] 98.6 F (37 C) (09/27 0827) Pulse Rate:  [95-113] 105 (09/27 0827) Resp:  [16-32] 16 (09/27 0827) BP: (89-141)/(51-88) 138/66 mmHg (09/27 0827) SpO2:  [95 %-100 %] 98 % (09/27 0827) FiO2 (%):  [28 %-35 %] 28 % (09/27 0757)  HEMODYNAMICS:    VENTILATOR  SETTINGS: Vent Mode:  [-]  FiO2 (%):  [28 %-35 %] 28 %  INTAKE / OUTPUT:  Intake/Output Summary (Last 24 hours) at 07/24/14 1008 Last data filed at 07/24/14 1002  Gross per 24 hour  Intake    530 ml  Output    750 ml  Net   -220 ml   PHYSICAL EXAMINATION: General: Obese male, in NAD  Neuro: awake, alert, follows commands. Initially drowsy in chair. Alert to voice and cooperative. HEENT No clear JVD Cardiovascular: Distant, RRR, Nl S1/S2, -M/R/G. Sternal incision scar Lungs:  resp's even/non-labored on 28%ATC, lungs bilaterally distant but clear Abdomen: Soft, non-tender. + bowel sounds, obese Musculoskeletal:  Intact  Skin:  LE edema, slight, mild erythema and warmth to left LE and LUE . Pressure sore- dressing on sacrum  LABS:  CBC  Recent Labs Lab 07/22/14 0355 07/23/14 0340 07/24/14 0500  WBC 12.2* 17.7* 14.7*  HGB 12.8* 15.4 12.9*  HCT 41.5 48.7 40.9  PLT 364 351 363     Coag's  Recent Labs Lab 07/19/14 0409 07/21/14 1050  APTT  --  26  INR 1.30  --    BMET  Recent Labs Lab 07/19/14 0950 07/21/14 1050 07/23/14 0340  NA 153* 148* 151*  K 3.7 3.9 3.9  CL 104 102 106  CO2 38* 33* 27  BUN 65* 44* 42*  CREATININE 1.07 0.93 1.05  GLUCOSE 135* 99 97   Electrolytes  Recent Labs Lab 07/19/14 0950 07/21/14 1050 07/23/14 0340  CALCIUM 9.3 9.5 9.6  Sepsis Markers No results found for this basename: LATICACIDVEN, PROCALCITON, O2SATVEN,  in the last 168 hours ABG   Recent Labs Lab 07/20/14 1245  PHART 7.441  PCO2ART 54.8*  PO2ART 303.0*   Liver Enzymes No results found for this basename: AST, ALT, ALKPHOS, BILITOT, ALBUMIN,  in the last 168 hours  Cardiac Enzymes No results found for this basename: TROPONINI, PROBNP,  in the last 168 hours  Glucose  Recent Labs Lab 07/23/14 1216 07/23/14 1649 07/23/14 1935 07/24/14 0008 07/24/14 0411 07/24/14 0824  GLUCAP 123* 399* 374* 215* 232* 147*   Imaging Dg Chest Port 1  View  07/23/2014   CLINICAL DATA:  Evaluate airspace disease.  EXAM: PORTABLE CHEST - 1 VIEW  COMPARISON:  07/21/2014  FINDINGS: Tracheostomy tooth is unchanged. Lungs are hypoinflated without focal consolidation. Is mild blunting of the costophrenic angles bilaterally without significant change. Cardiomediastinal silhouette and remainder the exam is unchanged.  IMPRESSION: Mild stable blunting of the costophrenic angles which may represent a small amount of pleural fluid versus pleural parenchymal scarring.  Tracheostomy tube unchanged.   Electronically Signed   By: Elberta Fortis M.D.   On: 07/23/2014 07:10     ASSESSMENT / PLAN:  PULMONARY OETT 9/14>>>9/18, 9/18>>9/24 Janina Mayo 9/24 >>  A:  Acute respiratory failure s/p cardiopulm arrest.  Pulmonary Edema Pulmonary infiltrates R>L  > favor pulm edema but with leukocytosis increased as of 9/26/ afebrile. CXR stable w/o acute infiltrate PAH - noted on ECHO, PA peak 71, R/O PE Tobacco Abuse PE - ruled out, CTA chest negative, LE dopplers negative P:   ATC wean as tolerated Wean oxygen for sats 90-95% Pulmonary hygiene:  Mobilize as able  He has refused cardiac cath to eval his CAD and pHTN at this time.  Patient at this time is back and forth about procedures that are life saving and procedures that will direct therapy and management of his underlying conditions.   CARDIOVASCULAR R CVL 9/14>>9/21 R radial Aline 9/14>>9/23 (patient pulled out line) A:  VF/VT arrest. 10-15 minutes to ROSC  Circulatory shock - cardiogenic, resolved.   Pulmonary Edema  R/O PE - see above P:  PRN IV labetalol (discussed with cards).  Lasix as below  RENAL A:   Acute on chronic renal failure (baseline scr 1.5-2.0) Hyperkalemia > improving Hypernatremia 151 on 9/26 P:   Follow up BMP Lasix reduced to  qd (9/21)-watch Na & BUN Once Na normalized, consider a standing dose of lasix for discharge Replace electrolytes as indicated. 9/24 patient  refused blood draws in the AM, will re-address  GASTROINTESTINAL A:   Obesity  Dysphagia  P:   Restarted TF 9/19 PPI  SLP eval  HEMATOLOGIC A:   Anemia  R/O PE, DVT - ruled out.   P:  Trend CBC and coags  [place] heparin   INFECTIOUS A:   Empricially treated for CAP on admission Sinusitis - CT neck\head showed extensive sinus disease and left mastoid effusion P:   BCx2 9/14>>>neg Abx: levaquin, start date 9/14>>>9/14 ABX: doxy 9/14>>>9/18          rocephin 9/14>>>9/21            Unasyn 9/24>>>  Monitor fever curve / leukocytosis    ENDOCRINE A:   DM - on 65 bid lantus at home P:   SSI protocol  NEUROLOGIC A:   Acute encephalopathy s/p cardiac arrest > improving by hx P:   Fentanyl prn Haldol if agitated, caution with QT  GLOBAL: Patient  with complex situation socially, his children are mentally disabled and are being taken care of by other individuals, his parents also do have the mental capacity to make decisions for him, leaving his sister to make medical decisions on his behalf.  Currently he is unsure about having procedures done that can assist with management and treatment of his underlying conditions.  Including such procedures as cardiac catherization.    He has completely refused cardiac cath at this time.  He did agree to tracheostomy and is now weaning on ATC 28%. Likely will need SNF placement.   CD Maple Hudson, MD [facility] Pulmonary & Critical [facility] Pgr: [phone] or [phone]

## 2014-07-24 NOTE — Progress Notes (Signed)
Pharmacy: Unasyn  52yom s/p treatment for CAP was started on unasyn for sinusitis. He is now on day #4 unasyn. Renal function has remained stable.  Unasyn 9/24>> Doxy 9/14 >> 9/18 CTX 9/14 >> 9/21   9/14 blood cx x2>>negative  Plan: 1) Continue unasyn 1.5g IV q6 2) Please define length of therapy 3) Pharmacy will sign off  Louie Casa, PharmD, BCPS 07/24/2014, 10:33AM

## 2014-07-25 DIAGNOSIS — I469 Cardiac arrest, cause unspecified: Secondary | ICD-10-CM

## 2014-07-25 DIAGNOSIS — R5381 Other malaise: Secondary | ICD-10-CM

## 2014-07-25 LAB — CBC
HCT: 38.7 % — ABNORMAL LOW (ref 39.0–52.0)
Hemoglobin: 12.1 g/dL — ABNORMAL LOW (ref 13.0–17.0)
MCH: 28.7 pg (ref 26.0–34.0)
MCHC: 31.3 g/dL (ref 30.0–36.0)
MCV: 91.9 fL (ref 78.0–100.0)
PLATELETS: 360 10*3/uL (ref 150–400)
RBC: 4.21 MIL/uL — AB (ref 4.22–5.81)
RDW: 16.8 % — AB (ref 11.5–15.5)
WBC: 11.5 10*3/uL — ABNORMAL HIGH (ref 4.0–10.5)

## 2014-07-25 LAB — URINALYSIS, ROUTINE W REFLEX MICROSCOPIC
BILIRUBIN URINE: NEGATIVE
GLUCOSE, UA: 100 mg/dL — AB
HGB URINE DIPSTICK: NEGATIVE
KETONES UR: NEGATIVE mg/dL
Leukocytes, UA: NEGATIVE
Nitrite: NEGATIVE
Protein, ur: 100 mg/dL — AB
SPECIFIC GRAVITY, URINE: 1.021 (ref 1.005–1.030)
Urobilinogen, UA: 1 mg/dL (ref 0.0–1.0)
pH: 5.5 (ref 5.0–8.0)

## 2014-07-25 LAB — GLUCOSE, CAPILLARY
GLUCOSE-CAPILLARY: 218 mg/dL — AB (ref 70–99)
GLUCOSE-CAPILLARY: 289 mg/dL — AB (ref 70–99)
Glucose-Capillary: 125 mg/dL — ABNORMAL HIGH (ref 70–99)
Glucose-Capillary: 127 mg/dL — ABNORMAL HIGH (ref 70–99)
Glucose-Capillary: 240 mg/dL — ABNORMAL HIGH (ref 70–99)
Glucose-Capillary: 310 mg/dL — ABNORMAL HIGH (ref 70–99)

## 2014-07-25 LAB — URINE MICROSCOPIC-ADD ON

## 2014-07-25 MED ORDER — AMOXICILLIN-POT CLAVULANATE 875-125 MG PO TABS
1.0000 | ORAL_TABLET | Freq: Two times a day (BID) | ORAL | Status: DC
  Administered 2014-07-25 – 2014-07-26 (×3): 1 via ORAL
  Filled 2014-07-25 (×4): qty 1

## 2014-07-25 NOTE — Progress Notes (Signed)
07/25/2014 1345 Plan dc to SNF when medically stable. Referral to CSW. Isidoro Donning RN Case Mgmt phone [phone]

## 2014-07-25 NOTE — Progress Notes (Signed)
PULMONARY / CRITICAL CARE MEDICINE   Name: Richard [name] MRN: [id number] DOB: 30-May-1961    ADMISSION DATE:  07/11/2014 CONSULTATION DATE: 9/14  REFERRING MD :  Benjamine Mola  CHIEF COMPLAINT:  Cardiac arrest.   INITIAL PRESENTATION:  53 year old male transferred [facility] [place] on 9/14 w/ working dx of acute on chronic renal failure, hyperkalemia, encephalopathy and possible CAP. He was in ECHO when suddenly developed VF/VT arrest. ROSC estimated at 10-15 minutes. PCCM asked to assist w/ care s/p arrest.   STUDIES:  9/14 ECHO limited >> stopped due to cardiac arrest 9/15 ECHO >> EF 65-70%, no rwma, RV mildly dilated, PA peak 71 9/14 renal US >> no hydro, 5 mm non-obs renal calculi in R lower pole, small ascites in RUQ 9/19 LE Doppler >>no DVT 9/23 CTA Chest>>no PE, Mixed ground-glass and linear opacity throughout the lungs. Trace bilateral pleural fluid.9/23 CT Neck>>no masses.  Has left mastoid effusion, and sinus disease  SIGNIFICANT EVENTS: 9/14  Admitted w/ acute on chronic renal failure, hyperkalemia and encephalopathy  9/14  Cardiac arrest (VF/VT) while in echo 9/18  Failed extubation with immediate need for re-intubation, started on Steroids 9/23  Extubated>>>developed stridor and wheezing, reintubated within 2 hours of extubation 9/24  Percutaneous tracheostomy (Dr. Molli Knock).  Pt refused lab draws.   9/25  Weaning on ATC 40%, no distress  SUBJECTIVE:  Pt up to chair,   No distress on ATC 40%.   More cooperative and interactive today. Tolerating Dysphagia 2 diet/ PMV. Afebrile Denies pain RN reports odor in urine  VITAL SIGNS: Temp:  [97.9 F (36.6 C)-98.6 F (37 C)] 98.3 F (36.8 C) (09/28 0812) Pulse Rate:  [85-110] 96 (09/28 0745) Resp:  [16-20] 17 (09/28 0745) BP: (117-138)/(52-62) 122/58 mmHg (09/28 0812) SpO2:  [96 %-99 %] 96 % (09/28 0745) FiO2 (%):  [28 %-35 %] 28 % (09/28 0745) Weight:  [93.1 kg (205 lb 4 oz)] 93.1 kg (205 lb 4 oz) (09/28  0500)  HEMODYNAMICS:    VENTILATOR SETTINGS: Vent Mode:  [-]  FiO2 (%):  [28 %-35 %] 28 %  INTAKE / OUTPUT:  Intake/Output Summary (Last 24 hours) at 07/25/14 1136 Last data filed at 07/25/14 4098  Gross per 24 hour  Intake    530 ml  Output   2000 ml  Net  -1470 ml   PHYSICAL EXAMINATION: General: Obese male, in NAD  Neuro: awake, alert, follows commands. Alert to voice and cooperative. HEENT No clear JVD Cardiovascular: Distant, RRR, Nl S1/S2, -M/R/G. Sternal incision scar Lungs:  resp's even/non-labored on ATC, lungs bilaterally distant but clear Abdomen: Soft, non-tender. + bowel sounds, obese Musculoskeletal:  Intact  Skin:  LE edema, slight, mild erythema and warmth to left LE and LUE . Pressure sore- dressing on sacrum  LABS:  CBC  Recent Labs Lab 07/23/14 0340 07/24/14 0500 07/25/14 0335  WBC 17.7* 14.7* 11.5*  HGB 15.4 12.9* 12.1*  HCT 48.7 40.9 38.7*  PLT 351 363 360     Coag's  Recent Labs Lab 07/19/14 0409 07/21/14 1050  APTT  --  26  INR 1.30  --    BMET  Recent Labs Lab 07/19/14 0950 07/21/14 1050 07/23/14 0340  NA 153* 148* 151*  K 3.7 3.9 3.9  CL 104 102 106  CO2 38* 33* 27  BUN 65* 44* 42*  CREATININE 1.07 0.93 1.05  GLUCOSE 135* 99 97   Electrolytes  Recent Labs Lab 07/19/14 0950 07/21/14 1050 07/23/14 0340  CALCIUM 9.3 9.5 9.6   Sepsis Markers No results found for this basename: LATICACIDVEN, PROCALCITON, O2SATVEN,  in the last 168 hours ABG   Recent Labs Lab 07/20/14 1245  PHART 7.441  PCO2ART 54.8*  PO2ART 303.0*   Liver Enzymes No results found for this basename: AST, ALT, ALKPHOS, BILITOT, ALBUMIN,  in the last 168 hours  Cardiac Enzymes No results found for this basename: TROPONINI, PROBNP,  in the last 168 hours  Glucose  Recent Labs Lab 07/24/14 1226 07/24/14 1635 07/24/14 2018 07/24/14 2354 07/25/14 0520 07/25/14 0813  GLUCAP 301* 292* 395* 240* 127* 125*   Imaging No results  found.   ASSESSMENT / PLAN:  PULMONARY OETT 9/14>>>9/18, 9/18>>9/24 Janina Mayo 9/24 >>  A:  Acute respiratory failure s/p cardiopulm arrest.  Pulmonary Edema Pulmonary infiltrates R>L  > favor pulm edema but with leukocytosis increased as of 9/26/ afebrile. CXR stable w/o acute infiltrate PAH - noted on ECHO, PA peak 71, R/O PE Tobacco Abuse PE - ruled out, CTA chest negative, LE dopplers negative P:   ATC wean as tolerated Wean oxygen to 28% Pulmonary hygiene:  Mobilize as able  He has refused cardiac cath to eval his CAD and pHTN at this time.  Downsize trach 9/31   CARDIOVASCULAR R CVL 9/14>>9/21 R radial Aline 9/14>>9/23 (patient pulled out line) A:  VF/VT arrest. 10-15 minutes to ROSC  Circulatory shock - cardiogenic, resolved.   Pulmonary Edema   P:  PRN IV labetalol (discussed with cards).  Lasix as below  RENAL A:   Acute on chronic renal failure (baseline scr 1.5-2.0) Hyperkalemia > improving Hypernatremia 151 on 9/26 P:   Follow up BMP Lasix reduced to  qd (9/21)-watch Na & BUN Once Na normalized, consider a standing dose of lasix for discharge Replace electrolytes as indicated.   GASTROINTESTINAL A:   Obesity  Dysphagia  P:   PPI  SLP eval  HEMATOLOGIC A:   Anemia  R/O PE, DVT - ruled out.   P:  Trend CBC and coags  [place] heparin   INFECTIOUS A:   Empricially treated for CAP on admission Sinusitis - CT neck\head showed extensive sinus disease and left mastoid effusion P:   BCx2 9/14>>>neg Abx: levaquin, start date 9/14>>>9/14 ABX: doxy 9/14>>>9/18          rocephin 9/14>>>9/21            Unasyn 9/24>>>9/28, augmentin 9/28 >>  Monitor fever curve / leukocytosis    ENDOCRINE A:   DM - on 65 bid lantus at home P:   SSI protocol, on lantus 40 bid  NEUROLOGIC A:   Acute encephalopathy s/p cardiac arrest > improving by hx P:   Fentanyl prn Haldol if agitated, caution with QT  GLOBAL: Patient with complex situation socially,  his children are mentally disabled and are being taken care of by other individuals, his parents also do have the mental capacity to make decisions for him, leaving his sister to make medical decisions on his behalf.  Currently he is unsure about having procedures done that can assist with management and treatment of his underlying conditions.  Including such procedures as cardiac catherization.    He has completely refused cardiac cath at this time.  He did agree to tracheostomy and is now weaning on ATC 28%. Likely will need CIR/ SNF placement.   Cyril Mourning MD. Tonny Bollman. [place] [facility] Pager [phone] If no response call [phone]

## 2014-07-25 NOTE — Clinical Social Work Psychosocial (Signed)
Clinical Social Work Department BRIEF PSYCHOSOCIAL ASSESSMENT 07/25/2014  Patient:  Richard [name], Richard [name]     Account Number:  [id number]     Admit date:  07/11/2014  Clinical Social Worker:  Merlyn Lot, CLINICAL SOCIAL WORKER  Date/Time:  07/25/2014 04:40 PM  Referred by:  Physician  Date Referred:  07/25/2014 Referred for  SNF Placement   Other Referral:   Interview type:  Patient Other interview type:    PSYCHOSOCIAL DATA Living Status:  FAMILY Admitted from facility:   Level of care:   Primary support name:  Richard [name] Primary support relationship to patient:  FAMILY Degree of support available:   Patient reported that he lives with his nephew Richard [name] and has high level of support from him.    CURRENT CONCERNS Current Concerns  Post-Acute Placement   Other Concerns:    SOCIAL WORK ASSESSMENT / PLAN CSW spoke with patient about SNF placement if patient is unable to go to CIR.  Patient stated that he would rather go home than to a SNF- patient denied being at a SNF in the past but stated that his relative had gone to one.   Assessment/plan status:  Psychosocial Support/Ongoing Assessment of Needs Other assessment/ plan:   Information/referral to community resources:   none    PATIENT'S/FAMILY'S RESPONSE TO PLAN OF CARE: Patient is not agreeable to SNF back-up to CIR.    CSW will follow in case patient reconsiders.       Merlyn Lot, LCSWA Clinical Social Worker [phone]

## 2014-07-25 NOTE — Progress Notes (Signed)
Physical Therapy Treatment Patient Details Name: Richard [name] MRN: [id number] DOB: [birth year] Today's Date: 07/25/2014    History of Present Illness 53 y.o. male transferred from [facility] 07/11/14 (presented to hospital with hyperkalemia and altered mental status). 07/11/14 cardiac arrest and intubated; extubated and reintubated 07/15/14. Acute on chronic renal failure. Dopplers of bil LEs negative for DVT. 9/23 CTA negative for PE. 9/24 tracheostomy  PMHx- congestive heart failure, myocardial infarction (S./P. CABG), GERD, COPD, diabetes, chronic kidney disease    PT Comments    Pt eager to attempt ambulation as he wants to go home. On 28% trach collar his O2 saturation remained >90% with short distance of ambulation. RT in to suction patient at end of session and he feels like 'something is stuck down there' so further treatment deferred.   Follow Up Recommendations  CIR     Equipment Recommendations  None recommended by PT    Recommendations for Other Services OT consult     Precautions / Restrictions Precautions Precautions: Fall Precaution Comments: h/o falls PTA per chart    Mobility  Bed Mobility                  Transfers Overall transfer level: Needs assistance Equipment used: Rolling walker (2 wheeled) Transfers: Sit to/from Stand Sit to Stand: Min assist;+2 physical assistance;+2 safety/equipment         General transfer comment: stood total of 3 minutes while pericare performed and linens in chair changed; worked on upright posture  Ambulation/Gait Ambulation/Gait assistance: Management consultant (Feet): 15 Feet Assistive device: Rolling walker (2 wheeled) Gait Pattern/deviations: Step-through pattern;Decreased stride length;Trunk flexed   Gait velocity interpretation: Below normal speed for age/gender General Gait Details: pushed RW much too far ahead of himself and needed assist to stabilize RW while he  stepped himself closer to it   Careers information officer    Modified Rankin (Stroke Patients Only)       Balance                                    Cognition Arousal/Alertness: Awake/alert Behavior During Therapy: WFL for tasks assessed/performed Overall Cognitive Status: Within Functional Limits for tasks assessed                      Exercises      General Comments        Pertinent Vitals/Pain Pain Assessment: No/denies pain    Home Living                      Prior Function            PT Goals (current goals can now be found in the care plan section) Acute Rehab PT Goals Patient Stated Goal: on PMV and states he wants to get well and go home Time For Goal Achievement: 08/01/14 Progress towards PT goals: Progressing toward goals    Frequency  Min 3X/week    PT Plan Current plan remains appropriate    Co-evaluation             End of Session Equipment Utilized During Treatment: Gait belt;Oxygen (28% trach collar) Activity Tolerance: Patient tolerated treatment well Patient left: in chair;with call bell/phone within reach     Time: 7829-5621 PT Time Calculation (min): 24 min  Charges:  $  Gait Training: 8-22 mins $Therapeutic Activity: 8-22 mins                    G Codes:      Jameelah Watts 07-31-14, 3:51 PM Pager [phone]

## 2014-07-25 NOTE — Consult Note (Signed)
Physical Medicine and Rehabilitation Consult Reason for Consult: Debilitated/respiratory failure status post cardiopulmonary arrest Referring Physician: Critical care   HPI: Richard [name] is a 53 y.o. right hand male multi-medical with history of diastolic congestive heart failure, tobacco abuse, coronary artery disease with CABG, COPD, type 2 diabetes mellitus, chronic renal insufficiency with baseline creatinine 1.5-2.0. Independent with a cane due to back pain prior to admission As well his complex social situation as his children are mentally disabled and being taken care of by other individuals at this time. Admitted from [facility] to [facility] [place] 07/11/2014 with generalized weakness findings of hyperkalemia and altered mental status as well white blood cell count 14,000 blood pressure elevated 199/114. He was treated with Kayexalate for hyperkalemia. While undergoing echocardiogram suddenly developedVF/VT arrest. CT angiogram chest negative for pulmonary emboli. Required intubation for respiratory failure with placement of percutaneous tracheostomy 07/21/2014 per critical care medicine. Patient refused cardiac catheterization to evaluate his coronary artery disease. Subcutaneous heparin for DVT prophylaxis. Currently on a dysphagia 2 nectar thick liquids. Physical therapy evaluation completed an ongoing with recommendations of physical medicine rehabilitation consult.   Review of Systems  Respiratory: Positive for shortness of breath.   Cardiovascular: Positive for leg swelling.  Gastrointestinal:       GERD  Genitourinary: Positive for urgency.  Musculoskeletal: Positive for back pain and myalgias.  All other systems reviewed and are negative.  Past Medical History  Diagnosis Date  . CAD (coronary artery disease) of artery bypass graft 07/11/2014  . Cardiac arrest 07/11/2014  . CHF (congestive heart failure) 07/11/2014  . HTN (hypertension) 07/11/2014  . COPD  (chronic obstructive pulmonary disease) 07/11/2014  . DM (diabetes mellitus) 07/11/2014  . GERD (gastroesophageal reflux disease) 07/11/2014  . Acute on chronic kidney failure 07/11/2014   No past surgical history on file. Family History  Problem Relation Age of Onset  . Hypertension Sister   . Heart attack Father    Social History:  has no tobacco, alcohol, and drug history on file. Allergies: No Known Allergies Medications Prior to Admission  Medication Sig Dispense Refill  . albuterol (PROVENTIL HFA;VENTOLIN HFA) 108 (90 BASE) MCG/ACT inhaler Inhale 1-2 puffs into the lungs every 6 (six) hours as needed for wheezing or shortness of breath.      Marland Kitchen atorvastatin (LIPITOR) 40 MG tablet Take 20 mg by mouth daily.      . fluticasone (FLOVENT HFA) 220 MCG/ACT inhaler Inhale 2 puffs into the lungs 2 (two) times daily.      . furosemide (LASIX) 40 MG tablet Take 40 mg by mouth 2 (two) times daily.      Marland Kitchen gemfibrozil (LOPID) 600 MG tablet Take 600 mg by mouth 2 (two) times daily before a meal.      . insulin glargine (LANTUS) 100 UNIT/ML injection Inject 65 Units into the skin 2 (two) times daily.      Marland Kitchen lisinopril-hydrochlorothiazide (PRINZIDE,ZESTORETIC) 20-25 MG per tablet Take 1 tablet by mouth daily.      . metFORMIN (GLUCOPHAGE) 1000 MG tablet Take 1,000 mg by mouth 2 (two) times daily with a meal.      . metoprolol succinate (TOPROL-XL) 50 MG 24 hr tablet Take 50 mg by mouth daily. Take with or immediately following a meal.      . pregabalin (LYRICA) 75 MG capsule Take 75 mg by mouth 3 (three) times daily.      . tamsulosin (FLOMAX) 0.4 MG CAPS capsule Take 0.4  mg by mouth daily.      Marland Kitchen tiotropium (SPIRIVA) 18 MCG inhalation capsule Place 18 mcg into inhaler and inhale daily.        Home: Home Living Family/patient expects to be discharged to:: Unsure Additional Comments: no family present  Functional History: Prior Function Level of Independence: Independent with assistive  device(s) Comments: pt indicates (via yes/no questions) he used a cane PTA due to back pain; had 2 falls PTA per chart; indicates he does not live alone Functional Status:  Mobility: Bed Mobility Overal bed mobility: Needs Assistance Bed Mobility: Supine to Sit Supine to sit: Mod assist;HOB elevated General bed mobility comments: on ICU/air mattress; step by step vc for sequencing, assist to move Legs and raise trunk due to weakness Transfers Overall transfer level: Needs assistance Equipment used: 2 person hand held assist Transfers: Sit to/from [employer] Sit to Stand: Min assist;+2 physical assistance;+2 safety/equipment Stand pivot transfers: +2 physical assistance;Mod assist General transfer comment: to stand x 2; remains flexed at hips due to back pain; pivotal steps with effort and pt reaching for UE support      ADL:    Cognition: Cognition Overall Cognitive Status: Difficult to assess Orientation Level: Oriented to person;Oriented to place;Oriented to time Cognition Arousal/Alertness: Awake/alert Behavior During Therapy: WFL for tasks assessed/performed Overall Cognitive Status: Difficult to assess Difficult to assess due to: Tracheostomy  Blood pressure 124/52, pulse 97, temperature 98.3 F (36.8 C), temperature source Oral, resp. rate 19, height  (1.6 m), weight 93.1 kg (205 lb 4 oz), SpO2 97.00%. Physical Exam  Constitutional:  53 year old male appearing older than stated age. Sitting up at bedside chair.  HENT:  Head: Normocephalic.  Eyes: EOM are normal.  Neck:  #6 cuffed trach in place. Fairly good phonation with PMV. Occasionally has difficulties with swallowing (trying to chew/swallow between words).  Cardiovascular:  Cardiac rate controlled  Respiratory:  Scattered rhonchi at the bases  GI: Soft. Bowel sounds are normal. He exhibits no distension.  Neurological: He is alert.  Patient is able to provide his name, age and date of  birth. Follows simple commands. UE's 3+ delt, 4/5 bicep/tricep/HI. LE's: 3/5 HF, 3+KE, 4- ankles. No gross sensory changes.   Psychiatric:  Flat, cooperative    Results for orders placed during the hospital encounter of 07/11/14 (from the past 24 hour(s))  GLUCOSE, CAPILLARY     Status: Abnormal   Collection Time    07/24/14 12:26 PM      Result Value Ref Range   Glucose-Capillary 301 (*) 70 - 99 mg/dL  GLUCOSE, CAPILLARY     Status: Abnormal   Collection Time    07/24/14  4:35 PM      Result Value Ref Range   Glucose-Capillary 292 (*) 70 - 99 mg/dL  GLUCOSE, CAPILLARY     Status: Abnormal   Collection Time    07/24/14  8:18 PM      Result Value Ref Range   Glucose-Capillary 395 (*) 70 - 99 mg/dL  GLUCOSE, CAPILLARY     Status: Abnormal   Collection Time    07/24/14 11:54 PM      Result Value Ref Range   Glucose-Capillary 240 (*) 70 - 99 mg/dL  CBC     Status: Abnormal   Collection Time    07/25/14  3:35 AM      Result Value Ref Range   WBC 11.5 (*) 4.0 - 10.5 K/uL   RBC 4.21 (*) 4.22 - 5.81  MIL/uL   Hemoglobin 12.1 (*) 13.0 - 17.0 g/dL   HCT 16.1 (*) 09.6 - 04.5 %   MCV 91.9  78.0 - 100.0 fL   MCH 28.7  26.0 - 34.0 pg   MCHC 31.3  30.0 - 36.0 g/dL   RDW 40.9 (*) 81.1 - 91.4 %   Platelets 360  150 - 400 K/uL  GLUCOSE, CAPILLARY     Status: Abnormal   Collection Time    07/25/14  5:20 AM      Result Value Ref Range   Glucose-Capillary 127 (*) 70 - 99 mg/dL  GLUCOSE, CAPILLARY     Status: Abnormal   Collection Time    07/25/14  8:13 AM      Result Value Ref Range   Glucose-Capillary 125 (*) 70 - 99 mg/dL  URINALYSIS, ROUTINE W REFLEX MICROSCOPIC     Status: Abnormal   Collection Time    07/25/14  9:45 AM      Result Value Ref Range   Color, Urine YELLOW  YELLOW   APPearance CLOUDY (*) CLEAR   Specific Gravity, Urine 1.021  1.005 - 1.030   pH 5.5  5.0 - 8.0   Glucose, UA 100 (*) NEGATIVE mg/dL   Hgb urine dipstick NEGATIVE  NEGATIVE   Bilirubin Urine NEGATIVE   NEGATIVE   Ketones, ur NEGATIVE  NEGATIVE mg/dL   Protein, ur 782 (*) NEGATIVE mg/dL   Urobilinogen, UA 1.0  0.0 - 1.0 mg/dL   Nitrite NEGATIVE  NEGATIVE   Leukocytes, UA NEGATIVE  NEGATIVE  URINE MICROSCOPIC-ADD ON     Status: None   Collection Time    07/25/14  9:45 AM      Result Value Ref Range   Squamous Epithelial / LPF RARE  RARE   WBC, UA 0-2  <3 WBC/hpf   RBC / HPF 0-2  <3 RBC/hpf   Bacteria, UA RARE  RARE   No results found.  Assessment/Plan: Diagnosis: deconditioning after cardiac arrest, respiratory failure 1. Does the need for close, 24 hr/day medical supervision in concert with the patient's rehab needs make it unreasonable for this patient to be served in a less intensive setting? Yes 2. Co-Morbidities requiring supervision/potential complications: dm, chf, copd, gerd 3. Due to bladder management, bowel management, safety, skin/wound care, disease management, medication administration, pain management and patient education, does the patient require 24 hr/day rehab nursing? Yes 4. Does the patient require coordinated care of a physician, rehab nurse, PT (1-2 hrs/day, 5 days/week), OT (1-2 hrs/day, 5 days/week) and SLP (1-2 hrs/day, 5 days/week) to address physical and functional deficits in the context of the above medical diagnosis(es)? Yes Addressing deficits in the following areas: balance, endurance, locomotion, strength, transferring, bowel/bladder control, bathing, dressing, feeding, grooming, toileting, cognition, speech and psychosocial support 5. Can the patient actively participate in an intensive therapy program of at least 3 hrs of therapy per day at least 5 days per week? Yes 6. The potential for patient to make measurable gains while on inpatient rehab is excellent 7. Anticipated functional outcomes upon discharge from inpatient rehab are modified independent and supervision  with PT, modified independent and supervision with OT, modified independent with  SLP. 8. Estimated rehab length of stay to reach the above functional goals is: 10-12 days 9. Does the patient have adequate social supports to accommodate these discharge functional goals? Yes 10. Anticipated D/C setting: Home 11. Anticipated post D/C treatments: HH therapy 12. Overall Rehab/Functional Prognosis: excellent  RECOMMENDATIONS: This patient's condition  is appropriate for continued rehabilitative care in the following setting: CIR Patient has agreed to participate in recommended program. Yes Note that insurance prior authorization may be required for reimbursement for recommended care.  Comment: Rehab Admissions Coordinator to follow up.  Thanks,  Ranelle Oyster, MD, [place] [facility]     07/25/2014

## 2014-07-25 NOTE — Progress Notes (Signed)
Inpatient Diabetes Program Recommendations  AACE/ADA: New Consensus Statement on Inpatient Glycemic Control (2013)  Target Ranges:  Prepandial:   less than 140 mg/dL      Peak postprandial:   less than 180 mg/dL (1-2 hours)      Critically ill patients:  140 - 180 mg/dL   Results for JMARI, PELC (MRN [id number]) as of 07/25/2014 09:05  Ref. Range 07/24/2014 00:08 07/24/2014 04:11 07/24/2014 08:24 07/24/2014 12:26 07/24/2014 16:35 07/24/2014 20:18 07/24/2014 23:54 07/25/2014 05:20  Glucose-Capillary Latest Range: 70-99 mg/dL 725 (H) 366 (H) 440 (H) 301 (H) 292 (H) 395 (H) 240 (H) 127 (H)   Diabetes history: DM2 Outpatient Diabetes medications: Lantus 65 units BID, Metformin 1000 mg BID Current orders for Inpatient glycemic control: Lantus 40 units BID, Novolog 0-20 units Q4H  Inpatient Diabetes Program Recommendations Insulin - Basal: Please increase Lantus to 50 units BID.  Thanks, Orlando Penner, RN, MSN, CCRN Diabetes Coordinator Inpatient Diabetes Program [phone] (Team Pager) [phone] ([facility] office) [phone] [place] [facility] office)

## 2014-07-26 ENCOUNTER — Inpatient Hospital Stay (HOSPITAL_COMMUNITY)
Admission: RE | Admit: 2014-07-26 | Discharge: 2014-08-23 | DRG: 948 | Disposition: A | Discharge status: Skilled Nursing Facility | Payer: Medicaid Other | Source: Intra-hospital | Attending: Physical Medicine & Rehabilitation | Admitting: Physical Medicine & Rehabilitation

## 2014-07-26 DIAGNOSIS — I5032 Chronic diastolic (congestive) heart failure: Secondary | ICD-10-CM | POA: Diagnosis present

## 2014-07-26 DIAGNOSIS — Z79899 Other long term (current) drug therapy: Secondary | ICD-10-CM | POA: Diagnosis not present

## 2014-07-26 DIAGNOSIS — F329 Major depressive disorder, single episode, unspecified: Secondary | ICD-10-CM

## 2014-07-26 DIAGNOSIS — J438 Other emphysema: Secondary | ICD-10-CM

## 2014-07-26 DIAGNOSIS — J9600 Acute respiratory failure, unspecified whether with hypoxia or hypercapnia: Secondary | ICD-10-CM

## 2014-07-26 DIAGNOSIS — K219 Gastro-esophageal reflux disease without esophagitis: Secondary | ICD-10-CM | POA: Diagnosis present

## 2014-07-26 DIAGNOSIS — Z716 Tobacco abuse counseling: Secondary | ICD-10-CM | POA: Diagnosis present

## 2014-07-26 DIAGNOSIS — E1142 Type 2 diabetes mellitus with diabetic polyneuropathy: Secondary | ICD-10-CM | POA: Diagnosis present

## 2014-07-26 DIAGNOSIS — N189 Chronic kidney disease, unspecified: Secondary | ICD-10-CM | POA: Diagnosis present

## 2014-07-26 DIAGNOSIS — Z794 Long term (current) use of insulin: Secondary | ICD-10-CM

## 2014-07-26 DIAGNOSIS — E1165 Type 2 diabetes mellitus with hyperglycemia: Secondary | ICD-10-CM

## 2014-07-26 DIAGNOSIS — Z930 Tracheostomy status: Secondary | ICD-10-CM | POA: Diagnosis not present

## 2014-07-26 DIAGNOSIS — I10 Essential (primary) hypertension: Secondary | ICD-10-CM

## 2014-07-26 DIAGNOSIS — Z5189 Encounter for other specified aftercare: Secondary | ICD-10-CM

## 2014-07-26 DIAGNOSIS — E875 Hyperkalemia: Secondary | ICD-10-CM | POA: Diagnosis present

## 2014-07-26 DIAGNOSIS — I5033 Acute on chronic diastolic (congestive) heart failure: Secondary | ICD-10-CM

## 2014-07-26 DIAGNOSIS — R5381 Other malaise: Secondary | ICD-10-CM

## 2014-07-26 DIAGNOSIS — I509 Heart failure, unspecified: Secondary | ICD-10-CM

## 2014-07-26 DIAGNOSIS — I2510 Atherosclerotic heart disease of native coronary artery without angina pectoris: Secondary | ICD-10-CM | POA: Diagnosis present

## 2014-07-26 DIAGNOSIS — I129 Hypertensive chronic kidney disease with stage 1 through stage 4 chronic kidney disease, or unspecified chronic kidney disease: Secondary | ICD-10-CM | POA: Diagnosis present

## 2014-07-26 DIAGNOSIS — N179 Acute kidney failure, unspecified: Secondary | ICD-10-CM

## 2014-07-26 DIAGNOSIS — Z8674 Personal history of sudden cardiac arrest: Secondary | ICD-10-CM | POA: Diagnosis not present

## 2014-07-26 DIAGNOSIS — E119 Type 2 diabetes mellitus without complications: Secondary | ICD-10-CM

## 2014-07-26 DIAGNOSIS — R1314 Dysphagia, pharyngoesophageal phase: Secondary | ICD-10-CM

## 2014-07-26 DIAGNOSIS — R531 Weakness: Principal | ICD-10-CM | POA: Diagnosis present

## 2014-07-26 DIAGNOSIS — R1313 Dysphagia, pharyngeal phase: Secondary | ICD-10-CM | POA: Diagnosis present

## 2014-07-26 DIAGNOSIS — I25810 Atherosclerosis of coronary artery bypass graft(s) without angina pectoris: Secondary | ICD-10-CM | POA: Diagnosis present

## 2014-07-26 DIAGNOSIS — IMO0002 Reserved for concepts with insufficient information to code with codable children: Secondary | ICD-10-CM

## 2014-07-26 DIAGNOSIS — Z720 Tobacco use: Secondary | ICD-10-CM

## 2014-07-26 DIAGNOSIS — J449 Chronic obstructive pulmonary disease, unspecified: Secondary | ICD-10-CM | POA: Diagnosis present

## 2014-07-26 DIAGNOSIS — J189 Pneumonia, unspecified organism: Secondary | ICD-10-CM

## 2014-07-26 DIAGNOSIS — J0410 Acute tracheitis without obstruction: Secondary | ICD-10-CM

## 2014-07-26 DIAGNOSIS — I469 Cardiac arrest, cause unspecified: Secondary | ICD-10-CM

## 2014-07-26 DIAGNOSIS — G9340 Encephalopathy, unspecified: Secondary | ICD-10-CM

## 2014-07-26 LAB — BASIC METABOLIC PANEL
ANION GAP: 12 (ref 5–15)
BUN: 27 mg/dL — ABNORMAL HIGH (ref 6–23)
CHLORIDE: 101 meq/L (ref 96–112)
CO2: 28 mEq/L (ref 19–32)
CREATININE: 0.87 mg/dL (ref 0.50–1.35)
Calcium: 8.5 mg/dL (ref 8.4–10.5)
GFR calc non Af Amer: >90 mL/min (ref >90)
Glucose, Bld: 183 mg/dL — ABNORMAL HIGH (ref 70–99)
POTASSIUM: 3.6 meq/L — AB (ref 3.7–5.3)
Sodium: 141 mEq/L (ref 137–147)

## 2014-07-26 LAB — CBC
HCT: 38.4 % — ABNORMAL LOW (ref 39.0–52.0)
HCT: 38.1 % — ABNORMAL LOW (ref 39.0–52.0)
Hemoglobin: 12.4 g/dL — ABNORMAL LOW (ref 13.0–17.0)
Hemoglobin: 12.1 g/dL — ABNORMAL LOW (ref 13.0–17.0)
MCH: 28.4 pg (ref 26.0–34.0)
MCH: 28.1 pg (ref 26.0–34.0)
MCHC: 32.3 g/dL (ref 30.0–36.0)
MCHC: 31.8 g/dL (ref 30.0–36.0)
MCV: 88.1 fL (ref 78.0–100.0)
MCV: 88.4 fL (ref 78.0–100.0)
PLATELETS: 370 10*3/uL (ref 150–400)
Platelets: 427 10*3/uL — ABNORMAL HIGH (ref 150–400)
RBC: 4.36 MIL/uL (ref 4.22–5.81)
RBC: 4.31 MIL/uL (ref 4.22–5.81)
RDW: 16.1 % — AB (ref 11.5–15.5)
RDW: 16 % — AB (ref 11.5–15.5)
WBC: 9.4 10*3/uL (ref 4.0–10.5)
WBC: 12.2 10*3/uL — ABNORMAL HIGH (ref 4.0–10.5)

## 2014-07-26 LAB — GLUCOSE, CAPILLARY
GLUCOSE-CAPILLARY: 185 mg/dL — AB (ref 70–99)
GLUCOSE-CAPILLARY: 130 mg/dL — AB (ref 70–99)
GLUCOSE-CAPILLARY: 185 mg/dL — AB (ref 70–99)
Glucose-Capillary: 146 mg/dL — ABNORMAL HIGH (ref 70–99)
Glucose-Capillary: 263 mg/dL — ABNORMAL HIGH (ref 70–99)

## 2014-07-26 LAB — CREATININE, SERUM
Creatinine, Ser: 0.84 mg/dL (ref 0.50–1.35)
GFR calc Af Amer: >90 mL/min (ref >90)
GFR calc non Af Amer: >90 mL/min (ref >90)

## 2014-07-26 MED ORDER — PRO-STAT SUGAR FREE PO LIQD
60.0000 mL | Freq: Two times a day (BID) | ORAL | Status: DC
  Administered 2014-07-27: 60 mL via ORAL
  Filled 2014-07-26 (×4): qty 60

## 2014-07-26 MED ORDER — INSULIN ASPART 100 UNIT/ML ~~LOC~~ SOLN: 0.0000 [IU] | SUBCUTANEOUS | Status: AC

## 2014-07-26 MED ORDER — ATORVASTATIN CALCIUM 80 MG PO TABS: 80.0000 mg | ORAL_TABLET | Freq: Every day | ORAL | Status: AC

## 2014-07-26 MED ORDER — CETYLPYRIDINIUM CHLORIDE 0.05 % MT LIQD
7.0000 mL | Freq: Four times a day (QID) | OROMUCOSAL | Status: DC
  Administered 2014-07-27 – 2014-08-23 (×88): 7 mL via OROMUCOSAL

## 2014-07-26 MED ORDER — ONDANSETRON HCL 4 MG/2ML IJ SOLN: 4.0000 mg | Freq: Four times a day (QID) | INTRAMUSCULAR | Status: DC | PRN

## 2014-07-26 MED ORDER — AMOXICILLIN-POT CLAVULANATE 875-125 MG PO TABS
1.0000 | ORAL_TABLET | Freq: Two times a day (BID) | ORAL | Status: DC
  Administered 2014-07-26 – 2014-07-27 (×2): 1 via ORAL
  Filled 2014-07-26 (×6): qty 1

## 2014-07-26 MED ORDER — PANTOPRAZOLE SODIUM 40 MG PO TBEC: 40.0000 mg | DELAYED_RELEASE_TABLET | Freq: Every day | ORAL | Status: AC

## 2014-07-26 MED ORDER — HEPARIN SODIUM (PORCINE) 5000 UNIT/ML IJ SOLN
5000.0000 [IU] | Freq: Three times a day (TID) | INTRAMUSCULAR | Status: DC
  Administered 2014-07-27 – 2014-08-23 (×78): 5000 [IU] via SUBCUTANEOUS
  Filled 2014-07-26 (×85): qty 1

## 2014-07-26 MED ORDER — HEPARIN SODIUM (PORCINE) 5000 UNIT/ML IJ SOLN: 5000.0000 [IU] | Freq: Three times a day (TID) | INTRAMUSCULAR | Status: AC

## 2014-07-26 MED ORDER — INSULIN GLARGINE 100 UNIT/ML ~~LOC~~ SOLN
40.0000 [IU] | Freq: Two times a day (BID) | SUBCUTANEOUS | Status: DC
  Administered 2014-07-26: 40 [IU] via SUBCUTANEOUS
  Filled 2014-07-26 (×2): qty 0.4

## 2014-07-26 MED ORDER — RESOURCE THICKENUP CLEAR PO POWD
ORAL | Status: DC | PRN
  Filled 2014-07-26: qty 125

## 2014-07-26 MED ORDER — SORBITOL 70 % SOLN
30.0000 mL | Freq: Every day | Status: DC | PRN
  Filled 2014-07-26: qty 30

## 2014-07-26 MED ORDER — CHLORHEXIDINE GLUCONATE 0.12 % MT SOLN
15.0000 mL | Freq: Two times a day (BID) | OROMUCOSAL | Status: DC
  Administered 2014-07-26 – 2014-08-23 (×49): 15 mL via OROMUCOSAL
  Filled 2014-07-26 (×58): qty 15

## 2014-07-26 MED ORDER — ATORVASTATIN CALCIUM 80 MG PO TABS
80.0000 mg | ORAL_TABLET | Freq: Every day | ORAL | Status: DC
  Administered 2014-07-29 – 2014-08-22 (×23): 80 mg
  Filled 2014-07-26 (×30): qty 1

## 2014-07-26 MED ORDER — INSULIN ASPART 100 UNIT/ML ~~LOC~~ SOLN
0.0000 [IU] | SUBCUTANEOUS | Status: DC
  Administered 2014-07-26: 11 [IU] via SUBCUTANEOUS
  Administered 2014-07-27: 4 [IU] via SUBCUTANEOUS
  Administered 2014-07-27: 7 [IU] via SUBCUTANEOUS
  Administered 2014-07-27 (×2): 4 [IU] via SUBCUTANEOUS
  Administered 2014-07-27: 3 [IU] via SUBCUTANEOUS
  Administered 2014-07-27: 7 [IU] via SUBCUTANEOUS
  Administered 2014-07-28 – 2014-07-30 (×2): 3 [IU] via SUBCUTANEOUS
  Administered 2014-07-30: 4 [IU] via SUBCUTANEOUS
  Administered 2014-07-30: 7 [IU] via SUBCUTANEOUS
  Administered 2014-07-30 (×2): 3 [IU] via SUBCUTANEOUS
  Administered 2014-07-30: 7 [IU] via SUBCUTANEOUS
  Administered 2014-07-31: 11 [IU] via SUBCUTANEOUS
  Administered 2014-07-31 (×2): 7 [IU] via SUBCUTANEOUS
  Administered 2014-07-31: 4 [IU] via SUBCUTANEOUS
  Administered 2014-07-31 (×2): 7 [IU] via SUBCUTANEOUS
  Administered 2014-08-01: 3 [IU] via SUBCUTANEOUS
  Administered 2014-08-01: 7 [IU] via SUBCUTANEOUS
  Administered 2014-08-01: 3 [IU] via SUBCUTANEOUS
  Administered 2014-08-01: 4 [IU] via SUBCUTANEOUS
  Administered 2014-08-01: 7 [IU] via SUBCUTANEOUS
  Administered 2014-08-01: 4 [IU] via SUBCUTANEOUS
  Administered 2014-08-02: 7 [IU] via SUBCUTANEOUS
  Administered 2014-08-02 (×2): 4 [IU] via SUBCUTANEOUS
  Administered 2014-08-02 (×2): 7 [IU] via SUBCUTANEOUS
  Administered 2014-08-02: 4 [IU] via SUBCUTANEOUS
  Administered 2014-08-03: 7 [IU] via SUBCUTANEOUS
  Administered 2014-08-05 (×2): 3 [IU] via SUBCUTANEOUS
  Administered 2014-08-06: 4 [IU] via SUBCUTANEOUS
  Administered 2014-08-06: 7 [IU] via SUBCUTANEOUS
  Administered 2014-08-06 (×4): 4 [IU] via SUBCUTANEOUS
  Administered 2014-08-07: 7 [IU] via SUBCUTANEOUS
  Administered 2014-08-07 (×2): 4 [IU] via SUBCUTANEOUS
  Administered 2014-08-07: 11 [IU] via SUBCUTANEOUS
  Administered 2014-08-07 – 2014-08-08 (×3): 7 [IU] via SUBCUTANEOUS
  Administered 2014-08-08: 4 [IU] via SUBCUTANEOUS
  Administered 2014-08-08 (×2): 11 [IU] via SUBCUTANEOUS
  Administered 2014-08-08 – 2014-08-09 (×4): 7 [IU] via SUBCUTANEOUS
  Administered 2014-08-09: 4 [IU] via SUBCUTANEOUS
  Administered 2014-08-09 (×2): 7 [IU] via SUBCUTANEOUS
  Administered 2014-08-09 – 2014-08-10 (×2): 4 [IU] via SUBCUTANEOUS
  Administered 2014-08-10 (×2): 3 [IU] via SUBCUTANEOUS
  Administered 2014-08-10: 4 [IU] via SUBCUTANEOUS
  Administered 2014-08-10: 7 [IU] via SUBCUTANEOUS
  Administered 2014-08-11: 4 [IU] via SUBCUTANEOUS
  Administered 2014-08-11: 7 [IU] via SUBCUTANEOUS
  Administered 2014-08-11: 4 [IU] via SUBCUTANEOUS
  Administered 2014-08-11 (×3): 7 [IU] via SUBCUTANEOUS
  Administered 2014-08-12 (×3): 4 [IU] via SUBCUTANEOUS
  Administered 2014-08-12: 3 [IU] via SUBCUTANEOUS
  Administered 2014-08-12: 4 [IU] via SUBCUTANEOUS
  Administered 2014-08-12: 7 [IU] via SUBCUTANEOUS
  Administered 2014-08-13: 4 [IU] via SUBCUTANEOUS
  Administered 2014-08-13: 11 [IU] via SUBCUTANEOUS
  Administered 2014-08-13: 7 [IU] via SUBCUTANEOUS
  Administered 2014-08-13: 4 [IU] via SUBCUTANEOUS
  Administered 2014-08-13: 11 [IU] via SUBCUTANEOUS
  Administered 2014-08-13: 4 [IU] via SUBCUTANEOUS
  Administered 2014-08-14: 7 [IU] via SUBCUTANEOUS
  Administered 2014-08-14: 11 [IU] via SUBCUTANEOUS
  Administered 2014-08-14: 7 [IU] via SUBCUTANEOUS
  Administered 2014-08-14: 4 [IU] via SUBCUTANEOUS
  Administered 2014-08-14: 7 [IU] via SUBCUTANEOUS
  Administered 2014-08-14: 4 [IU] via SUBCUTANEOUS
  Administered 2014-08-15 (×3): 7 [IU] via SUBCUTANEOUS
  Administered 2014-08-15 (×2): 11 [IU] via SUBCUTANEOUS
  Administered 2014-08-15: 4 [IU] via SUBCUTANEOUS
  Administered 2014-08-16: 7 [IU] via SUBCUTANEOUS
  Administered 2014-08-16: 11 [IU] via SUBCUTANEOUS
  Administered 2014-08-16: 3 [IU] via SUBCUTANEOUS
  Administered 2014-08-16 – 2014-08-17 (×2): 4 [IU] via SUBCUTANEOUS
  Administered 2014-08-17: 7 [IU] via SUBCUTANEOUS
  Administered 2014-08-17: 4 [IU] via SUBCUTANEOUS
  Administered 2014-08-17: 3 [IU] via SUBCUTANEOUS
  Administered 2014-08-17 – 2014-08-18 (×3): 4 [IU] via SUBCUTANEOUS
  Administered 2014-08-18 – 2014-08-19 (×9): 7 [IU] via SUBCUTANEOUS
  Administered 2014-08-19: 05:00:00 via SUBCUTANEOUS
  Administered 2014-08-20 (×2): 4 [IU] via SUBCUTANEOUS
  Administered 2014-08-20 (×4): 7 [IU] via SUBCUTANEOUS
  Administered 2014-08-21: 4 [IU] via SUBCUTANEOUS
  Administered 2014-08-21 (×5): 7 [IU] via SUBCUTANEOUS
  Administered 2014-08-21: 3 [IU] via SUBCUTANEOUS
  Administered 2014-08-22: 11 [IU] via SUBCUTANEOUS
  Administered 2014-08-22: 3 [IU] via SUBCUTANEOUS
  Administered 2014-08-22: 7 [IU] via SUBCUTANEOUS
  Administered 2014-08-22: 3 [IU] via SUBCUTANEOUS
  Administered 2014-08-22 – 2014-08-23 (×4): 7 [IU] via SUBCUTANEOUS

## 2014-07-26 MED ORDER — ACETAMINOPHEN 325 MG PO TABS: 325.0000 mg | ORAL_TABLET | ORAL | Status: DC | PRN

## 2014-07-26 MED ORDER — PANTOPRAZOLE SODIUM 40 MG PO TBEC
40.0000 mg | DELAYED_RELEASE_TABLET | Freq: Every day | ORAL | Status: DC
  Administered 2014-07-27 – 2014-08-02 (×5): 40 mg via ORAL
  Filled 2014-07-26 (×5): qty 1

## 2014-07-26 MED ORDER — FUROSEMIDE 10 MG/ML IJ SOLN: 20.0000 mg | Freq: Every day | INTRAMUSCULAR | Status: AC

## 2014-07-26 MED ORDER — HEPARIN SODIUM (PORCINE) 5000 UNIT/ML IJ SOLN: 5000.0000 [IU] | Freq: Three times a day (TID) | INTRAMUSCULAR | Status: DC

## 2014-07-26 MED ORDER — ONDANSETRON HCL 4 MG PO TABS: 4.0000 mg | ORAL_TABLET | Freq: Four times a day (QID) | ORAL | Status: DC | PRN

## 2014-07-26 MED ORDER — CETYLPYRIDINIUM CHLORIDE 0.05 % MT LIQD: 7.0000 mL | Freq: Four times a day (QID) | OROMUCOSAL | Status: AC

## 2014-07-26 MED ORDER — RESOURCE THICKENUP CLEAR PO POWD: ORAL | Status: AC

## 2014-07-26 MED ORDER — FUROSEMIDE 20 MG PO TABS
20.0000 mg | ORAL_TABLET | Freq: Every day | ORAL | Status: DC
  Administered 2014-07-27: 20 mg via ORAL
  Filled 2014-07-26 (×3): qty 1

## 2014-07-26 MED ORDER — CHLORHEXIDINE GLUCONATE 0.12 % MT SOLN: 15.0000 mL | Freq: Two times a day (BID) | OROMUCOSAL | Status: AC

## 2014-07-26 MED ORDER — AMOXICILLIN-POT CLAVULANATE 875-125 MG PO TABS: 1.0000 | ORAL_TABLET | Freq: Two times a day (BID) | ORAL | Status: AC

## 2014-07-26 MED ORDER — INSULIN GLARGINE 100 UNIT/ML ~~LOC~~ SOLN: 40.0000 [IU] | Freq: Two times a day (BID) | SUBCUTANEOUS | Status: AC

## 2014-07-26 MED ORDER — PRO-STAT SUGAR FREE PO LIQD: 60.0000 mL | Freq: Two times a day (BID) | ORAL | Status: AC

## 2014-07-26 NOTE — Discharge Summary (Signed)
Physician Discharge Summary       Patient ID: Richard [name] MRN: [id number] DOB/AGE: 55/05/1961 55 y.o.  Admit date: 07/11/2014 Discharge date: 07/26/2014  Discharge Diagnoses:  Acute respiratory failure  VF/VT  cardiopulm arrest.  Cardiogenic shock Pulmonary Edema  Possible CAP (NOS) PAH - noted on ECHO, PA peak 71, R/O PE  Tobacco Abuse Acute on chronic renal failure (baseline scr 1.5 to 2) Hyperkalemia (resolved) Hypernatremia (resolved) Dysphagia  Anemia of critical illness  Sinusitis (maxillary) Diabetes w/ hyperglycemia  Acute encephalopathy   Detailed Hospital Course:    53 y.o. 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 CABG), HLD, GERD, COPD, type 2 diabetes, chronic kidney disease (baseline creatinine 1.5-2.0), who presented 9/14 with generalized weakness, altered mental status and hyperkalemia. Patient was transferred from [facility] to [place]. Patient has altered mental status. Per records pt started feeling very weak 2 days ago without much complaints for other symptoms. Patient did not have fever, chills. He has a mild cough and wheezing which might be in his baseline due to his COPD. He did not have chest pain, abdominal pain, diarrhea. Patient went into [place] emergency room, he was found to have acute renal injury, with creatinine increased from baseline of 1.5-2.0 to 5.0, and hyperkalemia with potassium 6.9, without acute T wave peaking. His blood pressure was initially 199/114 which decreased to 109/55 after nitroglycerin patch. EKG showed old right bundle blockage Patient was treated with IV calcium chloride, bicarbonate, insulin, and one dose of Kayexalate (30 g by mouth). ABG showed pH 7.15, bicarbonate drip was started in [facility]. Of note patient was accidentally and wrongly given one dose of Lovenox subcutaneously, 100 mg in [facility]. When patient came to the floor, he is not oriented x3,  very drowsy, does not follow commands appropriately. Patient reports that he had 2 falls, without injury. He seems to move all his extremities. Blood pressure 115/90, heart rate 88, temperature 98.6. He is adimtted to telemetry bed for further evaluation and treatment. He was admitted to hospital ist service w/ working dx of acute on chronic renal failure, Hyperkalemia, acute encephalopathy, Presumed heart failure and possible CAP. Was in ECHO lab the am of 9/14 when suddenly became unresponsive and suffered a VF/VT arrest. ACLS was initiated defibrillated. ROSC estimated 10-15 minutes. Transferred to the CICU s/p arrest.   He was admitted to the CICU. Treatment included: full ventilatory support, empiric antibiotics for possible CAP, volume resuscitation, bicarb gtt, renal consult, and vasopressor support for shock. We initially considered hypothermia protocol but he was purposeful on arrival to ICU so it was felt unnecessary. His ICU course of events were as follows: 9/14 renal US >> no hydro, 5 mm non-obs renal calculi in R lower pole, small ascites in RUQ. 9/15 ECHO >> EF 65-70%, no rwma, RV mildly dilated, PA peak 71. He was weaned off pressors as of 9/16. Aggressive diuresis was initiated and renal function was beginning to improve. Weaning efforts were started on 9/17 and he was extubated on 9/18. Failed extubation with immediate need for re-intubation, started on Steroids. We continued to treat him medically and he underwent trach on 9/24 by Dr Molli Knock. He refused cardiac cath and further cards work-up so cardiology signed off. He was eventually weaned off the vent. Moved to stepdown and rehab efforts and titration of diuretics were continued. As of 9/29 he continues to make slow but steady improvement to point it was felt he was ready  for d/c to  CIR where he can continue rehab efforts.     Discharge Plan by active problems   VF/VT arrest. 10-15 minutes to ROSC -->refused cardiac cath and all cardiac  evaluation  Circulatory shock - cardiogenic, resolved.  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 If tolerated he may be able to be decannulated prior to d/c. If he does not tolerate this, then long term trach may be best option as sleep apnea component would be cured as long as trach remains in place.  We will follow as consult to assist with this decision.   Pulmonary infiltrates R>L > favor pulm edema  PAH - noted on ECHO, PA peak 71, R/O PE  Tobacco Abuse  Plan:  Wean O2  Cont diuresis as tolerated  Smoking cessation  Pulm hygiene   Acute on chronic renal failure (baseline scr 1.5-2.0)  Hypernatremia 151 on 9/26  Plan  Follow up BMP  Lasix reduced to 20mg  qd (9/21)-watch Na & BUN   Obesity  Dysphagia  PLAN  Dysphagia diet   Anemia  Plan  F/u CBC   Sinusitis - CT neck\head showed extensive sinus disease and left mastoid effusion  Plan  Complete day 5/10 abx, currently on augmentin as of 9/28   DM  plan  SSI protocol, on lantus 40 bid   Deconditioning  Plan   in-patient rehab.   Significant Hospital tests/ studies  STUDIES:  9/14 ECHO limited >> stopped due to cardiac arrest  9/15 ECHO >> EF 65-70%, no rwma, RV mildly dilated, PA peak 71  9/14 renal US >> no hydro, 5 mm non-obs renal calculi in R lower pole, small ascites in RUQ  9/19 LE Doppler >>no DVT  9/23 CTA Chest>>no PE, Mixed ground-glass and linear opacity throughout the lungs. Trace bilateral pleural fluid.9/23 CT Neck>>no masses. Has left mastoid effusion, and sinus disease   SIGNIFICANT EVENTS:  9/14 Admitted w/ acute on chronic renal failure, hyperkalemia  and encephalopathy  9/14 Cardiac arrest (VF/VT) while in echo  9/18 Failed extubation with immediate need for re-intubation, started on Steroids  9/23 Extubated>>>developed stridor and wheezing, reintubated within 2 hours of extubation  9/24 Percutaneous tracheostomy (Dr. Molli KnockYacoub). Pt refused lab draws.  9/25 Weaning on ATC 40%, no distress   Discharge Exam: BP 135/66  Pulse 105  Temp(Src) 98.3 F (36.8 C) (Oral)  Resp 16  Ht 5\' 3"  (1.6 m)  Wt 93 kg (205 lb 0.4 oz)  BMI 36.33 kg/m2  SpO2 97%  General: Obese male, in NAD  Neuro: awake, alert, follows commands. Alert to voice and cooperative.  HEENT No clear JVD  Cardiovascular: Distant, RRR, Nl S1/S2, -M/R/G. Sternal incision scar  Lungs: resp's even/non-labored on ATC, lungs bilaterally distant but clear  Abdomen: Soft, non-tender. + bowel sounds, obese  Musculoskeletal: Intact  Skin: LE edema, slight, mild erythema and warmth to left LE and LUE . Pressure sore- dressing on sacrum   Labs at discharge Lab Results  Component Value Date   CREATININE 0.87 07/26/2014   BUN 27* 07/26/2014   NA 141 07/26/2014   K 3.6* 07/26/2014   CL 101 07/26/2014   CO2 28 07/26/2014  Lab Results  Component Value Date   WBC 9.4 07/26/2014   HGB 12.4* 07/26/2014   HCT 38.4* 07/26/2014   MCV 88.1 07/26/2014   PLT 427* 07/26/2014   Lab Results  Component Value Date   ALT 34 07/12/2014   AST 20 07/12/2014   ALKPHOS 89 07/12/2014   BILITOT 0.3 07/12/2014   Lab Results  Component Value Date   INR 1.30 07/19/2014   INR 1.10 07/11/2014    Current radiology studies No results found.  Disposition:       Discharge Instructions   Diet - low sodium heart healthy    Complete by:  As directed      Increase activity slowly    Complete by:  As directed             Medication List    STOP taking these medications       fluticasone 220 MCG/ACT inhaler  Commonly known as:  FLOVENT HFA     furosemide 40 MG tablet  Commonly known as:  LASIX    Replaced by:  furosemide 10 MG/ML injection     gemfibrozil 600 MG tablet  Commonly known as:  LOPID     lisinopril-hydrochlorothiazide 20-25 MG per tablet  Commonly known as:  PRINZIDE,ZESTORETIC     metFORMIN 1000 MG tablet  Commonly known as:  GLUCOPHAGE     pregabalin 75 MG capsule  Commonly known as:  LYRICA     tiotropium 18 MCG inhalation capsule  Commonly known as:  SPIRIVA      TAKE these medications       albuterol 108 (90 BASE) MCG/ACT inhaler  Commonly known as:  PROVENTIL HFA;VENTOLIN HFA  Inhale 1-2 puffs into the lungs every 6 (six) hours as needed for wheezing or shortness of breath.     amoxicillin-clavulanate 875-125 MG per tablet  Commonly known as:  AUGMENTIN  Take 1 tablet by mouth every 12 (twelve) hours.     antiseptic oral rinse 0.05 % Liqd solution  Commonly known as:  CPC / CETYLPYRIDINIUM CHLORIDE 0.05%  7 mLs by Mouth Rinse route QID.     atorvastatin 80 MG tablet  Commonly known as:  LIPITOR  Place 1 tablet (80 mg total) into feeding tube daily at 6 PM.     chlorhexidine 0.12 % solution  Commonly known as:  PERIDEX  15 mLs by Mouth Rinse route 2 (two) times daily.     feeding supplement (PRO-STAT SUGAR FREE 64) Liqd  Take 60 mLs by mouth 2 (two) times daily at 10 am and 4 pm.     furosemide 10 MG/ML injection  Commonly known as:  LASIX  Inject 2 mLs (20 mg total) into the vein daily.     heparin 5000 UNIT/ML injection  Inject 1 mL (5,000 Units total) into the skin every 8 (eight) hours.     insulin aspart 100 UNIT/ML injection  Commonly known as:  novoLOG  Inject 0-20 Units into the skin every 4 (four) hours.     insulin glargine 100 UNIT/ML injection  Commonly known as:  LANTUS  Inject 0.4 mLs (40 Units total) into the skin 2 (two) times daily.     metoprolol succinate 50 MG 24 hr tablet  Commonly known as:  TOPROL-XL  Take 50 mg by mouth daily. Take with or immediately following a meal.     pantoprazole 40 MG tablet   Commonly known as:  PROTONIX  Take 1 tablet (40 mg total) by mouth  daily.     RESOURCE THICKENUP CLEAR Powd  Thicken     tamsulosin 0.4 MG Caps capsule  Commonly known as:  FLOMAX  Take 0.4 mg by mouth daily.         Discharged Condition: good   Signed: BABCOCK,PETE 07/26/2014, 12:03 PM   Physician Statement:   The Patient was personally examined, the discharge assessment and plan has been personally reviewed and I agree with ACNP Babcock's assessment and plan. > 30 minutes of time have been dedicated to discharge assessment, planning and discharge instructions.   Oretha Milch MD

## 2014-07-26 NOTE — Progress Notes (Signed)
Rehab admissions - Evaluated for possible admission.  I met with patient, his sister and his nephew.  Patient would like to admit to acute inpatient rehab.  I spoke with case Freight forwarder and with Laurey Arrow.  Patient medically ready and will admit to acute inpatient rehab today.  Call me for questions.  #[phone]

## 2014-07-26 NOTE — Progress Notes (Signed)
Physical Medicine and Rehabilitation Consult  Reason for Consult: Debilitated/respiratory failure status post cardiopulmonary arrest  Referring Physician: Critical care  HPI: Richard [name] is a 53 y.o. right hand male multi-medical with history of diastolic congestive heart failure, tobacco abuse, coronary artery disease with CABG, COPD, type 2 diabetes mellitus, chronic renal insufficiency with baseline creatinine 1.5-2.0. Independent with a cane due to back pain prior to admission As well his complex social situation as his children are mentally disabled and being taken care of by other individuals at this time. Admitted from [facility] to [facility] 07/11/2014 with generalized weakness findings of hyperkalemia and altered mental status as well white blood cell count 14,000 blood pressure elevated 199/114. He was treated with Kayexalate for hyperkalemia. While undergoing echocardiogram suddenly developedVF/VT arrest. CT angiogram chest negative for pulmonary emboli. Required intubation for respiratory failure with placement of percutaneous tracheostomy 07/21/2014 per critical care medicine. Patient refused cardiac catheterization to evaluate his coronary artery disease. Subcutaneous heparin for DVT prophylaxis. Currently on a dysphagia 2 nectar thick liquids. Physical therapy evaluation completed an ongoing with recommendations of physical medicine rehabilitation consult.  Review of Systems  Respiratory: Positive for shortness of breath.  Cardiovascular: Positive for leg swelling.  Gastrointestinal:  GERD  Genitourinary: Positive for urgency.  Musculoskeletal: Positive for back pain and myalgias.  All other systems reviewed and are negative.   Past Medical History   Diagnosis  Date   .  CAD (coronary artery disease) of artery bypass graft  07/11/2014   .  Cardiac arrest  07/11/2014   .  CHF (congestive heart failure)  07/11/2014   .  HTN (hypertension)  07/11/2014   .  COPD  (chronic obstructive pulmonary disease)  07/11/2014   .  DM (diabetes mellitus)  07/11/2014   .  GERD (gastroesophageal reflux disease)  07/11/2014   .  Acute on chronic kidney failure  07/11/2014    No past surgical history on file.  Family History   Problem  Relation  Age of Onset   .  Hypertension  Sister    .  Heart attack  Father     Social History: has no tobacco, alcohol, and drug history on file.  Allergies: No Known Allergies  Medications Prior to Admission   Medication  Sig  Dispense  Refill   .  albuterol (PROVENTIL HFA;VENTOLIN HFA) 108 (90 BASE) MCG/ACT inhaler  Inhale 1-2 puffs into the lungs every 6 (six) hours as needed for wheezing or shortness of breath.     Marland Kitchen  atorvastatin (LIPITOR) 40 MG tablet  Take 20 mg by mouth daily.     .  fluticasone (FLOVENT HFA) 220 MCG/ACT inhaler  Inhale 2 puffs into the lungs 2 (two) times daily.     .  furosemide (LASIX) 40 MG tablet  Take 40 mg by mouth 2 (two) times daily.     Marland Kitchen  gemfibrozil (LOPID) 600 MG tablet  Take 600 mg by mouth 2 (two) times daily before a meal.     .  insulin glargine (LANTUS) 100 UNIT/ML injection  Inject 65 Units into the skin 2 (two) times daily.     Marland Kitchen  lisinopril-hydrochlorothiazide (PRINZIDE,ZESTORETIC) 20-25 MG per tablet  Take 1 tablet by mouth daily.     .  metFORMIN (GLUCOPHAGE) 1000 MG tablet  Take 1,000 mg by mouth 2 (two) times daily with a meal.     .  metoprolol succinate (TOPROL-XL) 50 MG 24 hr tablet  Take 50  mg by mouth daily. Take with or immediately following a meal.     .  pregabalin (LYRICA) 75 MG capsule  Take 75 mg by mouth 3 (three) times daily.     .  tamsulosin (FLOMAX) 0.4 MG CAPS capsule  Take 0.4 mg by mouth daily.     Marland Kitchen  tiotropium (SPIRIVA) 18 MCG inhalation capsule  Place 18 mcg into inhaler and inhale daily.      Home:  Home Living  Family/patient expects to be discharged to:: Unsure  Additional Comments: no family present  Functional History:  Prior Function  Level of  Independence: Independent with assistive device(s)  Comments: pt indicates (via yes/no questions) he used a cane PTA due to back pain; had 2 falls PTA per chart; indicates he does not live alone  Functional Status:  Mobility:  Bed Mobility  Overal bed mobility: Needs Assistance  Bed Mobility: Supine to Sit  Supine to sit: Mod assist;HOB elevated  General bed mobility comments: on ICU/air mattress; step by step vc for sequencing, assist to move Legs and raise trunk due to weakness  Transfers  Overall transfer level: Needs assistance  Equipment used: 2 person hand held assist  Transfers: Sit to/from [employer]  Sit to Stand: Min assist;+2 physical assistance;+2 safety/equipment  Stand pivot transfers: +2 physical assistance;Mod assist  General transfer comment: to stand x 2; remains flexed at hips due to back pain; pivotal steps with effort and pt reaching for UE support    ADL:   Cognition:  Cognition  Overall Cognitive Status: Difficult to assess  Orientation Level: Oriented to person;Oriented to place;Oriented to time  Cognition  Arousal/Alertness: Awake/alert  Behavior During Therapy: WFL for tasks assessed/performed  Overall Cognitive Status: Difficult to assess  Difficult to assess due to: Tracheostomy  Blood pressure 124/52, pulse 97, temperature 98.3 F (36.8 C), temperature source Oral, resp. rate 19, height 5\' 3"  (1.6 m), weight 93.1 kg (205 lb 4 oz), SpO2 97.00%.  Physical Exam  Constitutional:  53 year old male appearing older than stated age. Sitting up at bedside chair.  HENT:  Head: Normocephalic.  Eyes: EOM are normal.  Neck:  #6 cuffed trach in place. Fairly good phonation with PMV. Occasionally has difficulties with swallowing (trying to chew/swallow between words).  Cardiovascular:  Cardiac rate controlled  Respiratory:  Scattered rhonchi at the bases  GI: Soft. Bowel sounds are normal. He exhibits no distension.  Neurological: He is  alert.  Patient is able to provide his name, age and date of birth. Follows simple commands. UE's 3+ delt, 4/5 bicep/tricep/HI. LE's: 3/5 HF, 3+KE, 4- ankles. No gross sensory changes.  Psychiatric:  Flat, cooperative   Results for orders placed during the hospital encounter of 07/11/14 (from the past 24 hour(s))   GLUCOSE, CAPILLARY Status: Abnormal    Collection Time    07/24/14 12:26 PM   Result  Value  Ref Range    Glucose-Capillary  301 (*)  70 - 99 mg/dL   GLUCOSE, CAPILLARY Status: Abnormal    Collection Time    07/24/14 4:35 PM   Result  Value  Ref Range    Glucose-Capillary  292 (*)  70 - 99 mg/dL   GLUCOSE, CAPILLARY Status: Abnormal    Collection Time    07/24/14 8:18 PM   Result  Value  Ref Range    Glucose-Capillary  395 (*)  70 - 99 mg/dL   GLUCOSE, CAPILLARY Status: Abnormal    Collection Time    07/24/14  11:54 PM   Result  Value  Ref Range    Glucose-Capillary  240 (*)  70 - 99 mg/dL   CBC Status: Abnormal    Collection Time    07/25/14 3:35 AM   Result  Value  Ref Range    WBC  11.5 (*)  4.0 - 10.5 K/uL    RBC  4.21 (*)  4.22 - 5.81 MIL/uL    Hemoglobin  12.1 (*)  13.0 - 17.0 g/dL    HCT  16.138.7 (*)  09.639.0 - 52.0 %    MCV  91.9  78.0 - 100.0 fL    MCH  28.7  26.0 - 34.0 pg    MCHC  31.3  30.0 - 36.0 g/dL    RDW  04.516.8 (*)  40.911.5 - 15.5 %    Platelets  360  150 - 400 K/uL   GLUCOSE, CAPILLARY Status: Abnormal    Collection Time    07/25/14 5:20 AM   Result  Value  Ref Range    Glucose-Capillary  127 (*)  70 - 99 mg/dL   GLUCOSE, CAPILLARY Status: Abnormal    Collection Time    07/25/14 8:13 AM   Result  Value  Ref Range    Glucose-Capillary  125 (*)  70 - 99 mg/dL   URINALYSIS, ROUTINE W REFLEX MICROSCOPIC Status: Abnormal    Collection Time    07/25/14 9:45 AM   Result  Value  Ref Range    Color, Urine  YELLOW  YELLOW    APPearance  CLOUDY (*)  CLEAR    Specific Gravity, Urine  1.021  1.005 - 1.030    pH  5.5  5.0 - 8.0    Glucose, UA  100 (*)   NEGATIVE mg/dL    Hgb urine dipstick  NEGATIVE  NEGATIVE    Bilirubin Urine  NEGATIVE  NEGATIVE    Ketones, ur  NEGATIVE  NEGATIVE mg/dL    Protein, ur  811100 (*)  NEGATIVE mg/dL    Urobilinogen, UA  1.0  0.0 - 1.0 mg/dL    Nitrite  NEGATIVE  NEGATIVE    Leukocytes, UA  NEGATIVE  NEGATIVE   URINE MICROSCOPIC-ADD ON Status: None    Collection Time    07/25/14 9:45 AM   Result  Value  Ref Range    Squamous Epithelial / LPF  RARE  RARE    WBC, UA  0-2  <3 WBC/hpf    RBC / HPF  0-2  <3 RBC/hpf    Bacteria, UA  RARE  RARE    No results found.  Assessment/Plan:  Diagnosis: deconditioning after cardiac arrest, respiratory failure  1. Does the need for close, 24 hr/day medical supervision in concert with the patient's rehab needs make it unreasonable for this patient to be served in a less intensive setting? Yes 2. Co-Morbidities requiring supervision/potential complications: dm, chf, copd, gerd 3. Due to bladder management, bowel management, safety, skin/wound care, disease management, medication administration, pain management and patient education, does the patient require 24 hr/day rehab nursing? Yes 4. Does the patient require coordinated care of a physician, rehab nurse, PT (1-2 hrs/day, 5 days/week), OT (1-2 hrs/day, 5 days/week) and SLP (1-2 hrs/day, 5 days/week) to address physical and functional deficits in the context of the above medical diagnosis(es)? Yes Addressing deficits in the following areas: balance, endurance, locomotion, strength, transferring, bowel/bladder control, bathing, dressing, feeding, grooming, toileting, cognition, speech and psychosocial support 5. Can the patient actively participate in an  intensive therapy program of at least 3 hrs of therapy per day at least 5 days per week? Yes 6. The potential for patient to make measurable gains while on inpatient rehab is excellent 7. Anticipated functional outcomes upon discharge from inpatient rehab are modified independent  and supervision with PT, modified independent and supervision with OT, modified independent with SLP. 8. Estimated rehab length of stay to reach the above functional goals is: 10-12 days 9. Does the patient have adequate social supports to accommodate these discharge functional goals? Yes 10. Anticipated D/C setting: Home 11. Anticipated post D/C treatments: HH therapy 12. Overall Rehab/Functional Prognosis: excellent RECOMMENDATIONS:  This patient's condition is appropriate for continued rehabilitative care in the following setting: CIR  Patient has agreed to participate in recommended program. Yes  Note that insurance prior authorization may be required for reimbursement for recommended care.  Comment: Rehab Admissions Coordinator to follow up.  Thanks,  Ranelle Oyster, MD, [place] [facility]  07/25/2014  Revision History...      Date/Time User Action    07/25/2014 5:12 PM Ranelle Oyster, MD Sign    07/25/2014 12:24 PM Charlton Amor, PA-C Pend   View Details Report    Routing History.Marland KitchenMarland Kitchen

## 2014-07-26 NOTE — Progress Notes (Signed)
Nursing Note: Pt suctioned for moderate amount of thin clear secretions. Pt tolerated well.wbb

## 2014-07-26 NOTE — Progress Notes (Signed)
PMR Admission Coordinator Pre-Admission Assessment  Patient: Richard [name] is an 53 y.o., male  MRN: [id number]  DOB: 1961/04/10  Height: 5\' 3"  (160 cm)  Weight: 93 kg (205 lb 0.4 oz)  Insurance Information  HMO: PPO: PCP: IPA: 80/20: OTHER:  PRIMARY: Medicaid [place] access Policy#: [id number] n Subscriber: Jadene Pierini  CM Name: Phone#: Fax#:  Pre-Cert#: Employer: Disabled  Benefits: Phone #: [phone] Name: Automated  Eff. Date: 07/26/14 eligibility verified Deduct: Out of Pocket Max: Life Max: CIR: SNF:  Outpatient: Co-Pay:  Home Health: Co-Pay:  DME: Co-Pay:  Providers:  Emergency Contact Information  Contact Information    Name  Relation  Home  Work  Mobile    [place]  Sister    [phone]    Estil Daft  Sister  [phone]      Nathanal, Hermiz  [phone]        Current Medical History  Patient Admitting Diagnosis: Deconditioned after cardiac arrest, resp failure  History of Present Illness: A 53 y.o. right hand male multi-medical with history of diastolic congestive heart failure, tobacco abuse, coronary artery disease with CABG, COPD, type 2 diabetes mellitus, chronic renal insufficiency with baseline creatinine 1.5-2.0. Independent with a cane due to back pain prior to admission As well his complex social situation as his children are mentally disabled and being taken care of by other individuals at this time. Admitted from [facility] to [facility] 07/11/2014 with generalized weakness findings of hyperkalemia and altered mental status as well white blood cell count 14,000 blood pressure elevated 199/114. He was treated with Kayexalate for hyperkalemia. While undergoing echocardiogram suddenly developedVF/VT arrest. CT angiogram chest negative for pulmonary emboli. Required intubation for respiratory failure with placement of percutaneous tracheostomy 07/21/2014 per critical care medicine. Patient refused cardiac catheterization to  evaluate his coronary artery disease. Subcutaneous heparin for DVT prophylaxis. Currently on a dysphagia 2 nectar thick liquids. Physical therapy evaluation completed an ongoing with recommendations of physical medicine rehabilitation consult.  Past Medical History  Past Medical History   Diagnosis  Date   .  CAD (coronary artery disease) of artery bypass graft  07/11/2014   .  Cardiac arrest  07/11/2014   .  CHF (congestive heart failure)  07/11/2014   .  HTN (hypertension)  07/11/2014   .  COPD (chronic obstructive pulmonary disease)  07/11/2014   .  DM (diabetes mellitus)  07/11/2014   .  GERD (gastroesophageal reflux disease)  07/11/2014   .  Acute on chronic kidney failure  07/11/2014    Family History  family history includes Heart attack in his father; Hypertension in his sister.  Prior Rehab/Hospitalizations: Had [facility] therapies after CABG surgery  Current Medications  Current facility-administered medications:0.9 % sodium chloride infusion, , Intravenous, PRN, Stephanie Acre, MD, Last Rate: 10 mL/hr at 07/25/14 0527, 500 mL at 07/25/14 0527; amoxicillin-clavulanate (AUGMENTIN) 875-125 MG per tablet 1 tablet, 1 tablet, Oral, Q12H, Cyril Mourning V, MD, 1 tablet at 07/26/14 0919  antiseptic oral rinse (CPC / CETYLPYRIDINIUM CHLORIDE 0.05%) solution 7 mL, 7 mL, Mouth Rinse, QID, Lupita Leash, MD, 7 mL at 07/26/14 0400; atorvastatin (LIPITOR) tablet 80 mg, 80 mg, Per Tube, q1800, Runell Gess, MD, 80 mg at 07/25/14 1703; chlorhexidine (PERIDEX) 0.12 % solution 15 mL, 15 mL, Mouth Rinse, BID, Lupita Leash, MD, 15 mL at 07/26/14 0919  feeding supplement (PRO-STAT SUGAR FREE 64) liquid 60 mL, 60 mL, Oral, BID, Normand Sloop, RD; fentaNYL (SUBLIMAZE) bolus via infusion 50-100  mcg, 50-100 mcg, Intravenous, Q1H PRN, Leslye Peer, MD; furosemide (LASIX) injection 20 mg, 20 mg, Intravenous, Daily, Vishal Mungal, MD, 20 mg at 07/26/14 0922; heparin injection 5,000 Units, 5,000 Units, Subcutaneous,  3 times per day, Stephanie Acre, MD, 5,000 Units at 07/26/14 0539  hydrALAZINE (APRESOLINE) injection 10 mg, 10 mg, Intravenous, 3 times per day, Lupita Leash, MD, 10 mg at 07/26/14 0539; Influenza vac split quadrivalent PF (FLUARIX) injection 0.5 mL, 0.5 mL, Intramuscular, Prior to discharge, Lupita Leash, MD; insulin aspart (novoLOG) injection 0-20 Units, 0-20 Units, Subcutaneous, 6 times per day, Merwyn Katos, MD, 3 Units at 07/26/14 0920  insulin glargine (LANTUS) injection 40 Units, 40 Units, Subcutaneous, BID, Merwyn Katos, MD, 40 Units at 07/26/14 [phone]; labetalol (NORMODYNE,TRANDATE) injection 10 mg, 10 mg, Intravenous, Q4H PRN, Stephanie Acre, MD, 10 mg at 07/18/14 1206; midazolam (VERSED) injection 2 mg, 2 mg, Intravenous, Q15 min PRN, Leslye Peer, MD, 2 mg at 07/20/14 0415  midazolam (VERSED) injection 2 mg, 2 mg, Intravenous, Q2H PRN, Leslye Peer, MD, 2 mg at 07/20/14 1849; pantoprazole (PROTONIX) EC tablet 40 mg, 40 mg, Oral, Daily, Nelda Bucks, MD, 40 mg at 07/26/14 0919; RESOURCE THICKENUP CLEAR, , Oral, PRN, Kalman Shan, MD  Patients Current Diet: Dysphagia  Precautions / Restrictions  Precautions  Precautions: Fall  Precaution Comments: h/o falls PTA per chart  Restrictions  Weight Bearing Restrictions: No  Prior Activity Level  Household: Mostly homebound, but went to [place] twice a month.  Home Assistive Devices / Equipment  Home Assistive Devices/Equipment: None  Prior Functional Level  Prior Function  Level of Independence: Independent with assistive device(s)  Comments: pt indicates (via yes/no questions) he used a cane PTA due to back pain; had 2 falls PTA per chart; indicates he does not live alone  Current Functional Level  Cognition  Overall Cognitive Status: Within Functional Limits for tasks assessed  Difficult to assess due to: Tracheostomy  Orientation Level: Oriented to person;Oriented to place;Oriented to time   Extremity Assessment   (includes Sensation/Coordination)  Upper Extremity Assessment: Overall WFL for tasks assessed  Lower Extremity Assessment: Overall WFL for tasks assessed [facility] and noted Rt ankle DF -20)   ADLs    Mobility  Overal bed mobility: Needs Assistance  Bed Mobility: Supine to Sit  Supine to sit: Mod assist;HOB elevated  General bed mobility comments: on ICU/air mattress; step by step vc for sequencing, assist to move Legs and raise trunk due to weakness   Transfers  Overall transfer level: Needs assistance  Equipment used: Rolling walker (2 wheeled)  Transfers: Sit to/from Stand  Sit to Stand: Min assist;+2 physical assistance;+2 safety/equipment  Stand pivot transfers: +2 physical assistance;Mod assist  General transfer comment: stood total of 3 minutes while pericare performed and linens in chair changed; worked on upright posture   Ambulation / Gait / Stairs / Psychologist, prison and probation services  Ambulation/Gait  Ambulation/Gait assistance: Producer, television/film/video (Feet): 15 Feet  Assistive device: Rolling walker (2 wheeled)  Gait Pattern/deviations: Step-through pattern;Decreased stride length;Trunk flexed  Gait velocity interpretation: Below normal speed for age/gender  General Gait Details: pushed RW much too far ahead of himself and needed assist to stabilize RW while he stepped himself closer to it   Posture / Balance  Overall balance assessment: Needs assistance  Sitting-balance support: No upper extremity supported;Feet supported  Sitting balance-Leahy Scale: Fair  Standing balance support: Bilateral upper extremity supported  Standing balance-Leahy Scale: Poor  Special needs/care consideration  BiPAP/CPAP Had CPAP at one time, but medicaid does not cover it and he could not afford it.  CPM No  Continuous Drip IV No  Dialysis No  Life Vest No  Oxygen Yes, trach collar. Wears O2 at night at home.  Special Bed No  Trach Size Yes, #6 Shiley cuffed with PMV  Wound Vac  (area) No  Skin Skin tear right lower leg, blister on lip, and buttocks are reddened  Bowel mgmt: Last BM 07/26/14  Bladder mgmt: [facility]  Diabetic mgmt Yes, on insulin and oral medications at home. Patient says that he has diabetic shoes.   Previous Home Environment  Living Arrangements: Other relatives  Home Care Services: No  Additional Comments: no family present  Discharge Living Setting  Plans for Discharge Living Setting: Lives with (comment);Mobile Home (Lives with nephew.) Aurther Loftephew appears mentally challenged, but answers questions appropriately.  Type of Home at Discharge: Mobile home  Discharge Home Layout: One level  Discharge Home Access: Stairs to enter  Entrance Stairs-Number of Steps: 5-7 steps at front entrance.  Does the patient have any problems obtaining your medications?: No  Social/Family/Support Systems  Contact Information: Estil DaftMamie Ledford - sister [phone]  Anticipated Caregiver: self and nephew  Ability/Limitations of Caregiver: Nephew lives with patient. Sister says they cannot help each other much.  Caregiver Availability: Intermittent  Discharge Plan Discussed with Primary Caregiver: Yes  Is Caregiver In Agreement with Plan?: Yes  Does Caregiver/Family have Issues with Lodging/Transportation while Pt is in Rehab?: No  Goals/Additional Needs  Patient/Family Goal for Rehab: PT/OT mod I and supervision, ST mod I goals  Expected length of stay: 10-12 days  Cultural Considerations: None  Dietary Needs: Dys II, nectar thick, carb modified diet  Equipment Needs: TBD  Additional Information: Anders SimmondsPete Babcock, NP, will be up on 10/01 to downsize trach to #4 or to cuffless.  Pt/Family Agrees to Admission and willing to participate: Yes  Program Orientation Provided & Reviewed with Pt/Caregiver Including Roles & Responsibilities: Yes  Decrease burden of Care through IP rehab admission: N/A  Possible need for SNF placement upon discharge: Not  planned  Patient Condition: This patient's condition remains as documented in the consult dated 07/25/14, in which the Rehabilitation Physician determined and documented that the patient's condition is appropriate for intensive rehabilitative care in an inpatient rehabilitation facility. Will admit to inpatient rehab today.  Preadmission Screen Completed By: Trish MageLogue, Faithe Ariola M, 07/26/2014 12:05 PM  ______________________________________________________________________  Discussed status with Dr. Riley KillSwartz on 07/26/14 at 1204 and received telephone approval for admission today.  Admission Coordinator: Trish MageLogue, Sayra Frisby M, time1204/Date09/29/15  Cosigned by: Ranelle OysterZachary T Swartz, MD [07/26/2014 12:40 PM]

## 2014-07-26 NOTE — H&P (View-Only) (Signed)
Physical Medicine and Rehabilitation Admission H&P    Chief complaint: Weakness  HPI: Richard [name] is a 53 y.o. right hand male multi-medical with history of diastolic congestive heart failure, tobacco abuse, coronary artery disease with CABG, COPD, type 2 diabetes mellitus, chronic renal insufficiency with baseline creatinine 1.5-2.0. Independent with a cane due to back pain prior to admission but has a complex social situation as his children are mentally disabled and being taken care of by other individuals at this time. Admitted from [facility] to [facility] 07/11/2014 with generalized weakness findings of hyperkalemia and altered mental status as well white blood cell count 14,000 blood pressure elevated 199/114. He was treated with Kayexalate for hyperkalemia. While undergoing echocardiogram suddenly developed VF/VT arrest. CT angiogram chest negative for pulmonary emboli. Required intubation for respiratory failure with placement of percutaneous tracheostomy 07/21/2014 per critical care medicine. Patient refused cardiac catheterization to evaluate his coronary artery disease. Plan is to downsize tracheostomy tube 07/28/2014. Subcutaneous heparin for DVT prophylaxis. Maintained on Augmentin x10 days total for findings of extensive sinus disease. Currently on a dysphagia 2 nectar thick liquids. Physical therapy evaluation completed an ongoing with recommendations of physical medicine rehabilitation consult. Patient was admitted for comprehensive rehabilitation program   ROS Review of Systems  Respiratory: Positive for shortness of breath.  Cardiovascular: Positive for leg swelling.  Gastrointestinal:  GERD  Genitourinary: Positive for urgency.  Musculoskeletal: Positive for back pain and myalgias.  All other systems reviewed and are negative  Past Medical History  Diagnosis Date  . CAD (coronary artery disease) of artery bypass graft 07/11/2014  . Cardiac arrest  07/11/2014  . CHF (congestive heart failure) 07/11/2014  . HTN (hypertension) 07/11/2014  . COPD (chronic obstructive pulmonary disease) 07/11/2014  . DM (diabetes mellitus) 07/11/2014  . GERD (gastroesophageal reflux disease) 07/11/2014  . Acute on chronic kidney failure 07/11/2014   No past surgical history on file. Family History  Problem Relation Age of Onset  . Hypertension Sister   . Heart attack Father    Social History:  has no tobacco, alcohol, and drug history on file. Allergies: No Known Allergies Medications Prior to Admission  Medication Sig Dispense Refill  . albuterol (PROVENTIL HFA;VENTOLIN HFA) 108 (90 BASE) MCG/ACT inhaler Inhale 1-2 puffs into the lungs every 6 (six) hours as needed for wheezing or shortness of breath.      Marland Kitchen atorvastatin (LIPITOR) 40 MG tablet Take 20 mg by mouth daily.      . fluticasone (FLOVENT HFA) 220 MCG/ACT inhaler Inhale 2 puffs into the lungs 2 (two) times daily.      . furosemide (LASIX) 40 MG tablet Take 40 mg by mouth 2 (two) times daily.      Marland Kitchen gemfibrozil (LOPID) 600 MG tablet Take 600 mg by mouth 2 (two) times daily before a meal.      . insulin glargine (LANTUS) 100 UNIT/ML injection Inject 65 Units into the skin 2 (two) times daily.      Marland Kitchen lisinopril-hydrochlorothiazide (PRINZIDE,ZESTORETIC) 20-25 MG per tablet Take 1 tablet by mouth daily.      . metFORMIN (GLUCOPHAGE) 1000 MG tablet Take 1,000 mg by mouth 2 (two) times daily with a meal.      . metoprolol succinate (TOPROL-XL) 50 MG 24 hr tablet Take 50 mg by mouth daily. Take with or immediately following a meal.      . pregabalin (LYRICA) 75 MG capsule Take 75 mg by mouth 3 (three) times daily.      Marland Kitchen  tamsulosin (FLOMAX) 0.4 MG CAPS capsule Take 0.4 mg by mouth daily.      Marland Kitchen tiotropium (SPIRIVA) 18 MCG inhalation capsule Place 18 mcg into inhaler and inhale daily.        Home: Home Living Family/patient expects to be discharged to:: Unsure Living Arrangements: Other  relatives Additional Comments: no family present   Functional History: Prior Function Level of Independence: Independent with assistive device(s) Comments: pt indicates (via yes/no questions) he used a cane PTA due to back pain; had 2 falls PTA per chart; indicates he does not live alone  Functional Status:  Mobility: Bed Mobility Overal bed mobility: Needs Assistance Bed Mobility: Supine to Sit Supine to sit: Mod assist;HOB elevated General bed mobility comments: on ICU/air mattress; step by step vc for sequencing, assist to move Legs and raise trunk due to weakness Transfers Overall transfer level: Needs assistance Equipment used: Rolling walker (2 wheeled) Transfers: Sit to/from Stand Sit to Stand: Min assist;+2 physical assistance;+2 safety/equipment Stand pivot transfers: +2 physical assistance;Mod assist General transfer comment: stood total of 3 minutes while pericare performed and linens in chair changed; worked on upright posture Ambulation/Gait Ambulation/Gait assistance: Insurance account manager (Feet): 15 Feet Assistive device: Rolling walker (2 wheeled) Gait Pattern/deviations: Step-through pattern;Decreased stride length;Trunk flexed Gait velocity interpretation: Below normal speed for age/gender General Gait Details: pushed RW much too far ahead of himself and needed assist to stabilize RW while he stepped himself closer to it    ADL: min to mod assist    Cognition: Cognition Overall Cognitive Status: Within Functional Limits for tasks assessed Orientation Level: Oriented to person;Oriented to place;Oriented to time Cognition Arousal/Alertness: Awake/alert Behavior During Therapy: WFL for tasks assessed/performed Overall Cognitive Status: Within Functional Limits for tasks assessed Difficult to assess due to: Tracheostomy  Physical Exam: Blood pressure 135/66, pulse 105, temperature 98.3 F (36.8 C), temperature source Oral, resp.  rate 16, height $RemoveBe'5\' 3"'LjuAVbQei$  (1.6 m), weight 93 kg (205 lb 0.4 oz), SpO2 97.00%. Physical Exam Constitutional:  53 year old male appearing older than stated age.   HENT: orlal mucosa pink/moist Head: Normocephalic.  Eyes: EOM are normal.  Neck:  #6 cuffed trach in place---cuff deflated. Fairly good phonation with PMV.   Cardiovascular:  Cardiac rate controlled. No murmurs. Reg rhythm  Respiratory:  Scattered rhonchi at the bases. No wheezes. Mild distress GI: Soft. Bowel sounds are normal. He exhibits no distension.  Neurological: He is alert.  Patient is able to provide his name, age and date of birth. Follows simple commands. CN exam grossly intact. UE:  3+ delt, 4/5 bicep/tricep/HI. LE's: 3/5 HF, 3+KE, 4- ankles. No gross sensory changes. Good sitting balance.  Psychiatric:  Flat, cooperative  Skin: a few scattered echhymoses on limbs.---mild irritation near gluteal folds---has pad on sacrum apparently for protection---no breakdown underneath.  Results for orders placed during the hospital encounter of 07/11/14 (from the past 48 hour(s))  GLUCOSE, CAPILLARY     Status: Abnormal   Collection Time    07/24/14 12:26 PM      Result Value Ref Range   Glucose-Capillary 301 (*) 70 - 99 mg/dL  GLUCOSE, CAPILLARY     Status: Abnormal   Collection Time    07/24/14  4:35 PM      Result Value Ref Range   Glucose-Capillary 292 (*) 70 - 99 mg/dL  GLUCOSE, CAPILLARY     Status: Abnormal   Collection Time    07/24/14  8:18 PM      Result Value  Ref Range   Glucose-Capillary 395 (*) 70 - 99 mg/dL  GLUCOSE, CAPILLARY     Status: Abnormal   Collection Time    07/24/14 11:54 PM      Result Value Ref Range   Glucose-Capillary 240 (*) 70 - 99 mg/dL  CBC     Status: Abnormal   Collection Time    07/25/14  3:35 AM      Result Value Ref Range   WBC 11.5 (*) 4.0 - 10.5 K/uL   Comment: WHITE COUNT CONFIRMED ON SMEAR   RBC 4.21 (*) 4.22 - 5.81 MIL/uL   Hemoglobin 12.1 (*) 13.0 - 17.0 g/dL   HCT 38.7  (*) 39.0 - 52.0 %   MCV 91.9  78.0 - 100.0 fL   MCH 28.7  26.0 - 34.0 pg   MCHC 31.3  30.0 - 36.0 g/dL   RDW 16.8 (*) 11.5 - 15.5 %   Platelets 360  150 - 400 K/uL  GLUCOSE, CAPILLARY     Status: Abnormal   Collection Time    07/25/14  5:20 AM      Result Value Ref Range   Glucose-Capillary 127 (*) 70 - 99 mg/dL  GLUCOSE, CAPILLARY     Status: Abnormal   Collection Time    07/25/14  8:13 AM      Result Value Ref Range   Glucose-Capillary 125 (*) 70 - 99 mg/dL  URINALYSIS, ROUTINE W REFLEX MICROSCOPIC     Status: Abnormal   Collection Time    07/25/14  9:45 AM      Result Value Ref Range   Color, Urine YELLOW  YELLOW   APPearance CLOUDY (*) CLEAR   Specific Gravity, Urine 1.021  1.005 - 1.030   pH 5.5  5.0 - 8.0   Glucose, UA 100 (*) NEGATIVE mg/dL   Hgb urine dipstick NEGATIVE  NEGATIVE   Bilirubin Urine NEGATIVE  NEGATIVE   Ketones, ur NEGATIVE  NEGATIVE mg/dL   Protein, ur 100 (*) NEGATIVE mg/dL   Urobilinogen, UA 1.0  0.0 - 1.0 mg/dL   Nitrite NEGATIVE  NEGATIVE   Leukocytes, UA NEGATIVE  NEGATIVE  URINE MICROSCOPIC-ADD ON     Status: None   Collection Time    07/25/14  9:45 AM      Result Value Ref Range   Squamous Epithelial / LPF RARE  RARE   WBC, UA 0-2  <3 WBC/hpf   RBC / HPF 0-2  <3 RBC/hpf   Bacteria, UA RARE  RARE  GLUCOSE, CAPILLARY     Status: Abnormal   Collection Time    07/25/14 12:41 PM      Result Value Ref Range   Glucose-Capillary 218 (*) 70 - 99 mg/dL  GLUCOSE, CAPILLARY     Status: Abnormal   Collection Time    07/25/14  5:05 PM      Result Value Ref Range   Glucose-Capillary 310 (*) 70 - 99 mg/dL  GLUCOSE, CAPILLARY     Status: Abnormal   Collection Time    07/25/14  8:26 PM      Result Value Ref Range   Glucose-Capillary 289 (*) 70 - 99 mg/dL   Comment 1 Notify RN    GLUCOSE, CAPILLARY     Status: Abnormal   Collection Time    07/26/14 12:35 AM      Result Value Ref Range   Glucose-Capillary 263 (*) 70 - 99 mg/dL   Comment 1 Notify  RN    CBC  Status: Abnormal   Collection Time    07/26/14  4:28 AM      Result Value Ref Range   WBC 9.4  4.0 - 10.5 K/uL   RBC 4.36  4.22 - 5.81 MIL/uL   Hemoglobin 12.4 (*) 13.0 - 17.0 g/dL   HCT 38.4 (*) 39.0 - 52.0 %   MCV 88.1  78.0 - 100.0 fL   MCH 28.4  26.0 - 34.0 pg   MCHC 32.3  30.0 - 36.0 g/dL   RDW 16.1 (*) 11.5 - 15.5 %   Platelets 427 (*) 150 - 400 K/uL  BASIC METABOLIC PANEL     Status: Abnormal   Collection Time    07/26/14  4:28 AM      Result Value Ref Range   Sodium 141  137 - 147 mEq/L   Potassium 3.6 (*) 3.7 - 5.3 mEq/L   Chloride 101  96 - 112 mEq/L   CO2 28  19 - 32 mEq/L   Glucose, Bld 183 (*) 70 - 99 mg/dL   BUN 27 (*) 6 - 23 mg/dL   Creatinine, Ser 0.87  0.50 - 1.35 mg/dL   Calcium 8.5  8.4 - 10.5 mg/dL   GFR calc non Af Amer >90  >90 mL/min   GFR calc Af Amer >90  >90 mL/min   Comment: (NOTE)     The eGFR has been calculated using the CKD EPI equation.     This calculation has not been validated in all clinical situations.     eGFR's persistently <90 mL/min signify possible Chronic Kidney     Disease.   Anion gap 12  5 - 15  GLUCOSE, CAPILLARY     Status: Abnormal   Collection Time    07/26/14  4:33 AM      Result Value Ref Range   Glucose-Capillary 185 (*) 70 - 99 mg/dL  GLUCOSE, CAPILLARY     Status: Abnormal   Collection Time    07/26/14  8:03 AM      Result Value Ref Range   Glucose-Capillary 130 (*) 70 - 99 mg/dL   No results found.     Medical Problem List and Plan: 1. Functional deficits secondary to debilitation after cardiac arrest/respiratory failure. 2. Pulmonary:  Status post tracheostomy 07/21/2014. Plan to downsize to a #6 cuffless versus #4 trach 07/28/2014 2.  DVT Prophylaxis/Anticoagulation: Subcutaneous heparin. Monitor platelet counts and any signs of bleeding 3. Pain Management: Tylenol as needed 4. Dysphagia. Dysphagia 2 nectar liquids. Followup speech therapy. Monitor for any aspiration   routine5. Neuropsych:  This patient is capable of making decisions on his own behalf. 6. Skin/Wound Care:  Routine skin care. Trach care as directed 7. Fluids/Electrolytes/Nutrition: followup chemistries. Strict I and O.'s 8. CAD/CABG. Patient has refused cardiac catheterization in the past 9. Diastolic congestive heart failure. Continue Lasix as directed. Monitor for any signs of fluid overload 10. Diabetes mellitus with peripheral neuropathy. Lantus insulin 40 units twice a day. Check blood sugars a.c. and at bedtime 11. Tobacco abuse. Provide counseling 12. Chronic renal insufficiency. Baseline creatinine 1.5 did 2.0. Followup chemistries 13. Extensive sinus disease left mastoid. Continue Augmentin x5 more days and stop    Post Admission Physician Evaluation: 1. Functional deficits secondary  to deconditioning after cardiac arrest and associated respiratory failure. 2. Patient is admitted to receive collaborative, interdisciplinary care between the physiatrist, rehab nursing staff, and therapy team. 3. Patient's level of medical complexity and substantial therapy needs in context of that  medical necessity cannot be provided at a lesser intensity of care such as a SNF. 4. Patient has experienced substantial functional loss from his/her baseline which was documented above under the "Functional History" and "Functional Status" headings.  Judging by the patient's diagnosis, physical exam, and functional history, the patient has potential for functional progress which will result in measurable gains while on inpatient rehab.  These gains will be of substantial and practical use upon discharge  in facilitating mobility and self-care at the household level. 5. Physiatrist will provide 24 hour management of medical needs as well as oversight of the therapy plan/treatment and provide guidance as appropriate regarding the interaction of the two. 6. 24 hour rehab nursing will assist with bladder management, bowel management,  safety, skin/wound care, disease management, medication administration, pain management and patient education  and help integrate therapy concepts, techniques,education, etc. 7. PT will assess and treat for/with: Lower extremity strength, range of motion, stamina, balance, functional mobility, safety, adaptive techniques and equipment, pain mgt, activity tolerance, pt education, community reintegration.   Goals are: mod I. 8. OT will assess and treat for/with: ADL's, functional mobility, safety, upper extremity strength, adaptive techniques and equipment, pain mgt, activity tolerance, community reintegration, leisure awareness.   Goals are: mod I. Therapy may not yet proceed with showering this patient. 9. SLP will assess and treat for/with: speech, dysphagia.  Goals are: mod I. 10. Case Management and Social Worker will assess and treat for psychological issues and discharge planning. 11. Team conference will be held weekly to assess progress toward goals and to determine barriers to discharge. 12. Patient will receive at least 3 hours of therapy per day at least 5 days per week. 13. ELOS: 12-14 days       14. Prognosis:  excellent     Meredith Staggers, MD, [facility] 07/26/2014   07/26/2014

## 2014-07-26 NOTE — Progress Notes (Signed)
Inpatient Diabetes Program Recommendations  AACE/ADA: New Consensus Statement on Inpatient Glycemic Control (2013)  Target Ranges:  Prepandial:   less than 140 mg/dL      Peak postprandial:   less than 180 mg/dL (1-2 hours)      Critically ill patients:  140 - 180 mg/dL    Results for Richard LoosenGOURLEY, Jonatan G (MRN [id number]) as of 07/26/2014 09:50  Ref. Range 07/24/2014 23:54 07/25/2014 05:20 07/25/2014 08:13 07/25/2014 12:41 07/25/2014 17:05 07/25/2014 20:26 07/26/2014 00:35 07/26/2014 04:33 07/26/2014 08:03  Glucose-Capillary Latest Range: 70-99 mg/dL 782240 (H) 956127 (H) 213125 (H) 218 (H) 310 (H) 289 (H) 263 (H) 185 (H) 130 (H)   Diabetes history: DM2  Outpatient Diabetes medications: Lantus 65 units BID, Metformin 1000 mg BID  Current orders for Inpatient glycemic control: Lantus 40 units BID, Novolog 0-20 units Q4H  Inpatient Diabetes Program Recommendations Insulin - Basal: Please increase Lantus to 50 units BID.  Note: CBGs have ranged from 125-310 mg/dl on 0/86/579/28/15 and fasting glucose is 130 mg/dl this morning. Patient received a total of Novolog 42 units on 07/25/14 for correction. Please increase Lantus to 50 units BID to improve glycemic control.  Thanks, Orlando PennerMarie Ahmia Colford, RN, MSN, CCRN Diabetes Coordinator Inpatient Diabetes Program [phone] (Team Pager) [phone] ([facility] office) [phone] [facility] office)

## 2014-07-26 NOTE — H&P (Signed)
Physical Medicine and Rehabilitation Admission H&P    Chief complaint: Weakness  HPI: Richard [name] is a 53 y.o. right hand male multi-medical with history of diastolic congestive heart failure, tobacco abuse, coronary artery disease with CABG, COPD, type 2 diabetes mellitus, chronic renal insufficiency with baseline creatinine 1.5-2.0. Independent with a cane due to back pain prior to admission but has a complex social situation as his children are mentally disabled and being taken care of by other individuals at this time. Admitted from [facility] to [facility] 07/11/2014 with generalized weakness findings of hyperkalemia and altered mental status as well white blood cell count 14,000 blood pressure elevated 199/114. He was treated with Kayexalate for hyperkalemia. While undergoing echocardiogram suddenly developed VF/VT arrest. CT angiogram chest negative for pulmonary emboli. Required intubation for respiratory failure with placement of percutaneous tracheostomy 07/21/2014 per critical care medicine. Patient refused cardiac catheterization to evaluate his coronary artery disease. Plan is to downsize tracheostomy tube 07/28/2014. Subcutaneous heparin for DVT prophylaxis. Maintained on Augmentin x10 days total for findings of extensive sinus disease. Currently on a dysphagia 2 nectar thick liquids. Physical therapy evaluation completed an ongoing with recommendations of physical medicine rehabilitation consult. Patient was admitted for comprehensive rehabilitation program   ROS Review of Systems  Respiratory: Positive for shortness of breath.  Cardiovascular: Positive for leg swelling.  Gastrointestinal:  GERD  Genitourinary: Positive for urgency.  Musculoskeletal: Positive for back pain and myalgias.  All other systems reviewed and are negative  Past Medical History  Diagnosis Date  . CAD (coronary artery disease) of artery bypass graft 07/11/2014  . Cardiac arrest  07/11/2014  . CHF (congestive heart failure) 07/11/2014  . HTN (hypertension) 07/11/2014  . COPD (chronic obstructive pulmonary disease) 07/11/2014  . DM (diabetes mellitus) 07/11/2014  . GERD (gastroesophageal reflux disease) 07/11/2014  . Acute on chronic kidney failure 07/11/2014   No past surgical history on file. Family History  Problem Relation Age of Onset  . Hypertension Sister   . Heart attack Father    Social History:  has no tobacco, alcohol, and drug history on file. Allergies: No Known Allergies Medications Prior to Admission  Medication Sig Dispense Refill  . albuterol (PROVENTIL HFA;VENTOLIN HFA) 108 (90 BASE) MCG/ACT inhaler Inhale 1-2 puffs into the lungs every 6 (six) hours as needed for wheezing or shortness of breath.      Marland Kitchen atorvastatin (LIPITOR) 40 MG tablet Take 20 mg by mouth daily.      . fluticasone (FLOVENT HFA) 220 MCG/ACT inhaler Inhale 2 puffs into the lungs 2 (two) times daily.      . furosemide (LASIX) 40 MG tablet Take 40 mg by mouth 2 (two) times daily.      Marland Kitchen gemfibrozil (LOPID) 600 MG tablet Take 600 mg by mouth 2 (two) times daily before a meal.      . insulin glargine (LANTUS) 100 UNIT/ML injection Inject 65 Units into the skin 2 (two) times daily.      Marland Kitchen lisinopril-hydrochlorothiazide (PRINZIDE,ZESTORETIC) 20-25 MG per tablet Take 1 tablet by mouth daily.      . metFORMIN (GLUCOPHAGE) 1000 MG tablet Take 1,000 mg by mouth 2 (two) times daily with a meal.      . metoprolol succinate (TOPROL-XL) 50 MG 24 hr tablet Take 50 mg by mouth daily. Take with or immediately following a meal.      . pregabalin (LYRICA) 75 MG capsule Take 75 mg by mouth 3 (three) times daily.      Marland Kitchen  tamsulosin (FLOMAX) 0.4 MG CAPS capsule Take 0.4 mg by mouth daily.      Marland Kitchen tiotropium (SPIRIVA) 18 MCG inhalation capsule Place 18 mcg into inhaler and inhale daily.        Home: Home Living Family/patient expects to be discharged to:: Unsure Living Arrangements: Other  relatives Additional Comments: no family present   Functional History: Prior Function Level of Independence: Independent with assistive device(s) Comments: pt indicates (via yes/no questions) he used a cane PTA due to back pain; had 2 falls PTA per chart; indicates he does not live alone  Functional Status:  Mobility: Bed Mobility Overal bed mobility: Needs Assistance Bed Mobility: Supine to Sit Supine to sit: Mod assist;HOB elevated General bed mobility comments: on ICU/air mattress; step by step vc for sequencing, assist to move Legs and raise trunk due to weakness Transfers Overall transfer level: Needs assistance Equipment used: Rolling walker (2 wheeled) Transfers: Sit to/from Stand Sit to Stand: Min assist;+2 physical assistance;+2 safety/equipment Stand pivot transfers: +2 physical assistance;Mod assist General transfer comment: stood total of 3 minutes while pericare performed and linens in chair changed; worked on upright posture Ambulation/Gait Ambulation/Gait assistance: Insurance account manager (Feet): 15 Feet Assistive device: Rolling walker (2 wheeled) Gait Pattern/deviations: Step-through pattern;Decreased stride length;Trunk flexed Gait velocity interpretation: Below normal speed for age/gender General Gait Details: pushed RW much too far ahead of himself and needed assist to stabilize RW while he stepped himself closer to it    ADL: min to mod assist    Cognition: Cognition Overall Cognitive Status: Within Functional Limits for tasks assessed Orientation Level: Oriented to person;Oriented to place;Oriented to time Cognition Arousal/Alertness: Awake/alert Behavior During Therapy: WFL for tasks assessed/performed Overall Cognitive Status: Within Functional Limits for tasks assessed Difficult to assess due to: Tracheostomy  Physical Exam: Blood pressure 135/66, pulse 105, temperature 98.3 F (36.8 C), temperature source Oral, resp.  rate 16, height $RemoveBe'5\' 3"'LaRQDUxRj$  (1.6 m), weight 93 kg (205 lb 0.4 oz), SpO2 97.00%. Physical Exam Constitutional:  53 year old male appearing older than stated age.   HENT: orlal mucosa pink/moist Head: Normocephalic.  Eyes: EOM are normal.  Neck:  #6 cuffed trach in place---cuff deflated. Fairly good phonation with PMV.   Cardiovascular:  Cardiac rate controlled. No murmurs. Reg rhythm  Respiratory:  Scattered rhonchi at the bases. No wheezes. Mild distress GI: Soft. Bowel sounds are normal. He exhibits no distension.  Neurological: He is alert.  Patient is able to provide his name, age and date of birth. Follows simple commands. CN exam grossly intact. UE:  3+ delt, 4/5 bicep/tricep/HI. LE's: 3/5 HF, 3+KE, 4- ankles. No gross sensory changes. Good sitting balance.  Psychiatric:  Flat, cooperative  Skin: a few scattered echhymoses on limbs.---mild irritation near gluteal folds---has pad on sacrum apparently for protection---no breakdown underneath.  Results for orders placed during the hospital encounter of 07/11/14 (from the past 48 hour(s))  GLUCOSE, CAPILLARY     Status: Abnormal   Collection Time    07/24/14 12:26 PM      Result Value Ref Range   Glucose-Capillary 301 (*) 70 - 99 mg/dL  GLUCOSE, CAPILLARY     Status: Abnormal   Collection Time    07/24/14  4:35 PM      Result Value Ref Range   Glucose-Capillary 292 (*) 70 - 99 mg/dL  GLUCOSE, CAPILLARY     Status: Abnormal   Collection Time    07/24/14  8:18 PM      Result Value  Ref Range   Glucose-Capillary 395 (*) 70 - 99 mg/dL  GLUCOSE, CAPILLARY     Status: Abnormal   Collection Time    07/24/14 11:54 PM      Result Value Ref Range   Glucose-Capillary 240 (*) 70 - 99 mg/dL  CBC     Status: Abnormal   Collection Time    07/25/14  3:35 AM      Result Value Ref Range   WBC 11.5 (*) 4.0 - 10.5 K/uL   Comment: WHITE COUNT CONFIRMED ON SMEAR   RBC 4.21 (*) 4.22 - 5.81 MIL/uL   Hemoglobin 12.1 (*) 13.0 - 17.0 g/dL   HCT 38.7  (*) 39.0 - 52.0 %   MCV 91.9  78.0 - 100.0 fL   MCH 28.7  26.0 - 34.0 pg   MCHC 31.3  30.0 - 36.0 g/dL   RDW 16.8 (*) 11.5 - 15.5 %   Platelets 360  150 - 400 K/uL  GLUCOSE, CAPILLARY     Status: Abnormal   Collection Time    07/25/14  5:20 AM      Result Value Ref Range   Glucose-Capillary 127 (*) 70 - 99 mg/dL  GLUCOSE, CAPILLARY     Status: Abnormal   Collection Time    07/25/14  8:13 AM      Result Value Ref Range   Glucose-Capillary 125 (*) 70 - 99 mg/dL  URINALYSIS, ROUTINE W REFLEX MICROSCOPIC     Status: Abnormal   Collection Time    07/25/14  9:45 AM      Result Value Ref Range   Color, Urine YELLOW  YELLOW   APPearance CLOUDY (*) CLEAR   Specific Gravity, Urine 1.021  1.005 - 1.030   pH 5.5  5.0 - 8.0   Glucose, UA 100 (*) NEGATIVE mg/dL   Hgb urine dipstick NEGATIVE  NEGATIVE   Bilirubin Urine NEGATIVE  NEGATIVE   Ketones, ur NEGATIVE  NEGATIVE mg/dL   Protein, ur 100 (*) NEGATIVE mg/dL   Urobilinogen, UA 1.0  0.0 - 1.0 mg/dL   Nitrite NEGATIVE  NEGATIVE   Leukocytes, UA NEGATIVE  NEGATIVE  URINE MICROSCOPIC-ADD ON     Status: None   Collection Time    07/25/14  9:45 AM      Result Value Ref Range   Squamous Epithelial / LPF RARE  RARE   WBC, UA 0-2  <3 WBC/hpf   RBC / HPF 0-2  <3 RBC/hpf   Bacteria, UA RARE  RARE  GLUCOSE, CAPILLARY     Status: Abnormal   Collection Time    07/25/14 12:41 PM      Result Value Ref Range   Glucose-Capillary 218 (*) 70 - 99 mg/dL  GLUCOSE, CAPILLARY     Status: Abnormal   Collection Time    07/25/14  5:05 PM      Result Value Ref Range   Glucose-Capillary 310 (*) 70 - 99 mg/dL  GLUCOSE, CAPILLARY     Status: Abnormal   Collection Time    07/25/14  8:26 PM      Result Value Ref Range   Glucose-Capillary 289 (*) 70 - 99 mg/dL   Comment 1 Notify RN    GLUCOSE, CAPILLARY     Status: Abnormal   Collection Time    07/26/14 12:35 AM      Result Value Ref Range   Glucose-Capillary 263 (*) 70 - 99 mg/dL   Comment 1 Notify  RN    CBC  Status: Abnormal   Collection Time    07/26/14  4:28 AM      Result Value Ref Range   WBC 9.4  4.0 - 10.5 K/uL   RBC 4.36  4.22 - 5.81 MIL/uL   Hemoglobin 12.4 (*) 13.0 - 17.0 g/dL   HCT 38.4 (*) 39.0 - 52.0 %   MCV 88.1  78.0 - 100.0 fL   MCH 28.4  26.0 - 34.0 pg   MCHC 32.3  30.0 - 36.0 g/dL   RDW 16.1 (*) 11.5 - 15.5 %   Platelets 427 (*) 150 - 400 K/uL  BASIC METABOLIC PANEL     Status: Abnormal   Collection Time    07/26/14  4:28 AM      Result Value Ref Range   Sodium 141  137 - 147 mEq/L   Potassium 3.6 (*) 3.7 - 5.3 mEq/L   Chloride 101  96 - 112 mEq/L   CO2 28  19 - 32 mEq/L   Glucose, Bld 183 (*) 70 - 99 mg/dL   BUN 27 (*) 6 - 23 mg/dL   Creatinine, Ser 0.87  0.50 - 1.35 mg/dL   Calcium 8.5  8.4 - 10.5 mg/dL   GFR calc non Af Amer >90  >90 mL/min   GFR calc Af Amer >90  >90 mL/min   Comment: (NOTE)     The eGFR has been calculated using the CKD EPI equation.     This calculation has not been validated in all clinical situations.     eGFR's persistently <90 mL/min signify possible Chronic Kidney     Disease.   Anion gap 12  5 - 15  GLUCOSE, CAPILLARY     Status: Abnormal   Collection Time    07/26/14  4:33 AM      Result Value Ref Range   Glucose-Capillary 185 (*) 70 - 99 mg/dL  GLUCOSE, CAPILLARY     Status: Abnormal   Collection Time    07/26/14  8:03 AM      Result Value Ref Range   Glucose-Capillary 130 (*) 70 - 99 mg/dL   No results found.     Medical Problem List and Plan: 1. Functional deficits secondary to debilitation after cardiac arrest/respiratory failure. 2. Pulmonary:  Status post tracheostomy 07/21/2014. Plan to downsize to a #6 cuffless versus #4 trach 07/28/2014 2.  DVT Prophylaxis/Anticoagulation: Subcutaneous heparin. Monitor platelet counts and any signs of bleeding 3. Pain Management: Tylenol as needed 4. Dysphagia. Dysphagia 2 nectar liquids. Followup speech therapy. Monitor for any aspiration   routine5. Neuropsych:  This patient is capable of making decisions on his own behalf. 6. Skin/Wound Care:  Routine skin care. Trach care as directed 7. Fluids/Electrolytes/Nutrition: followup chemistries. Strict I and O.'s 8. CAD/CABG. Patient has refused cardiac catheterization in the past 9. Diastolic congestive heart failure. Continue Lasix as directed. Monitor for any signs of fluid overload 10. Diabetes mellitus with peripheral neuropathy. Lantus insulin 40 units twice a day. Check blood sugars a.c. and at bedtime 11. Tobacco abuse. Provide counseling 12. Chronic renal insufficiency. Baseline creatinine 1.5 did 2.0. Followup chemistries 13. Extensive sinus disease left mastoid. Continue Augmentin x5 more days and stop    Post Admission Physician Evaluation: 1. Functional deficits secondary  to deconditioning after cardiac arrest and associated respiratory failure. 2. Patient is admitted to receive collaborative, interdisciplinary care between the physiatrist, rehab nursing staff, and therapy team. 3. Patient's level of medical complexity and substantial therapy needs in context of that  medical necessity cannot be provided at a lesser intensity of care such as a SNF. 4. Patient has experienced substantial functional loss from his/her baseline which was documented above under the "Functional History" and "Functional Status" headings.  Judging by the patient's diagnosis, physical exam, and functional history, the patient has potential for functional progress which will result in measurable gains while on inpatient rehab.  These gains will be of substantial and practical use upon discharge  in facilitating mobility and self-care at the household level. 5. Physiatrist will provide 24 hour management of medical needs as well as oversight of the therapy plan/treatment and provide guidance as appropriate regarding the interaction of the two. 6. 24 hour rehab nursing will assist with bladder management, bowel management,  safety, skin/wound care, disease management, medication administration, pain management and patient education  and help integrate therapy concepts, techniques,education, etc. 7. PT will assess and treat for/with: Lower extremity strength, range of motion, stamina, balance, functional mobility, safety, adaptive techniques and equipment, pain mgt, activity tolerance, pt education, community reintegration.   Goals are: mod I. 8. OT will assess and treat for/with: ADL's, functional mobility, safety, upper extremity strength, adaptive techniques and equipment, pain mgt, activity tolerance, community reintegration, leisure awareness.   Goals are: mod I. Therapy may not yet proceed with showering this patient. 9. SLP will assess and treat for/with: speech, dysphagia.  Goals are: mod I. 10. Case Management and Social Worker will assess and treat for psychological issues and discharge planning. 11. Team conference will be held weekly to assess progress toward goals and to determine barriers to discharge. 12. Patient will receive at least 3 hours of therapy per day at least 5 days per week. 13. ELOS: 12-14 days       14. Prognosis:  excellent     Meredith Staggers, MD, [place] [facility] 07/26/2014   07/26/2014

## 2014-07-26 NOTE — Progress Notes (Signed)
Nursing Note: Pt called requesting suctioning and sounds wet and  moist.A: pt suctioned for moderate to large amount of clear thin secretions the patient able to cough a small amount thru trach.Pt tolerated well.wbb

## 2014-07-26 NOTE — Interval H&P Note (Signed)
Cyril LoosenJimmie G Crompton was admitted today to Inpatient Rehabilitation with the diagnosis of severe deconditioning.  The patient's history has been reviewed, patient examined, and there is no change in status.  Patient continues to be appropriate for intensive inpatient rehabilitation.  I have reviewed the patient's chart and labs.  Questions were answered to the patient's satisfaction.  SWARTZ,ZACHARY T 07/26/2014, 9:26 PM

## 2014-07-26 NOTE — Progress Notes (Signed)
PULMONARY / CRITICAL CARE MEDICINE   Name: Richard [name] MRN: [id number] DOB: 1961-10-20    ADMISSION DATE:  07/11/2014 CONSULTATION DATE: 9/14  REFERRING MD :  Benjamine Mola  CHIEF COMPLAINT:  Cardiac arrest.   INITIAL PRESENTATION:  53 year old male transferred to [facility] from [place] on 9/14 w/ working dx of acute on chronic renal failure, hyperkalemia, encephalopathy and possible CAP. He was in ECHO when suddenly developed VF/VT arrest. ROSC estimated at 10-15 minutes. PCCM asked to assist w/ care s/p arrest.   STUDIES:  9/14 ECHO limited >> stopped due to cardiac arrest 9/15 ECHO >> EF 65-70%, no rwma, RV mildly dilated, PA peak 71 9/14 renal US >> no hydro, 5 mm non-obs renal calculi in R lower pole, small ascites in RUQ 9/19 LE Doppler >>no DVT 9/23 CTA Chest>>no PE, Mixed ground-glass and linear opacity throughout the lungs. Trace bilateral pleural fluid.9/23 CT Neck>>no masses.  Has left mastoid effusion, and sinus disease  Blood cultures/abx BCx2 9/14>>>neg Abx: levaquin, start date 9/14>>>9/14 ABX: doxy 9/14>>>9/18 rocephin 9/14>>>9/21     SIGNIFICANT EVENTS: 9/14  Admitted w/ acute on chronic renal failure, hyperkalemia and encephalopathy  9/14  Cardiac arrest (VF/VT) while in echo 9/14  Intubated, Right IJ CVL placed.  9/18  Failed extubation with immediate need for re-intubation, started on Steroids 9/24 right IJ CVL removed.  9/23  Extubated>>>developed stridor and wheezing, reintubated within 2 hours of extubation 9/24  Percutaneous tracheostomy (Dr. Molli Knock).  Pt refused lab draws.   9/25  Weaning on ATC 40%, no distress 9/29: delirium resolved, getting stronger, wants to go home    SUBJECTIVE:   Up in chair not in distress.   VITAL SIGNS: Temp:  [98.2 F (36.8 C)-98.8 F (37.1 C)] 98.3 F (36.8 C) (09/29 0752) Pulse Rate:  [84-103] 89 (09/29 0925) Resp:  [16-21] 18 (09/29 0925) BP: (107-135)/(52-66) 135/66 mmHg (09/29 0925) SpO2:  [95 %-99 %] 97 % (09/29  0925) FiO2 (%):  [28 %] 28 % (09/29 0752) Weight:  [93 kg (205 lb 0.4 oz)] 93 kg (205 lb 0.4 oz) (09/29 0433)  HEMODYNAMICS:    VENTILATOR SETTINGS: Vent Mode:  [-]  FiO2 (%):  [28 %] 28 %  INTAKE / OUTPUT:  Intake/Output Summary (Last 24 hours) at 07/26/14 1024 Last data filed at 07/26/14 1001  Gross per 24 hour  Intake    210 ml  Output   1700 ml  Net  -1490 ml   PHYSICAL EXAMINATION: General: Obese male, in NAD  Neuro: awake, alert, follows commands. Alert to voice and cooperative. HEENT No clear JVD, trach w/ 6 cuffed unremarkable, good phonation w/ PMV Cardiovascular: Distant, RRR, Nl S1/S2, -M/R/G. Sternal incision scar Lungs:  resp's even/non-labored on ATC, distant wheeze  Abdomen: Soft, non-tender. + bowel sounds, obese Musculoskeletal:  Intact  Skin:  LE edema, slight, mild erythema and warmth to left LE and LUE . Pressure sore- dressing on sacrum  LABS:  CBC  Recent Labs Lab 07/24/14 0500 07/25/14 0335 07/26/14 0428  WBC 14.7* 11.5* 9.4  HGB 12.9* 12.1* 12.4*  HCT 40.9 38.7* 38.4*  PLT 363 360 427*     Coag's  Recent Labs Lab 07/21/14 1050  APTT 26   BMET  Recent Labs Lab 07/21/14 1050 07/23/14 0340 07/26/14 0428  NA 148* 151* 141  K 3.9 3.9 3.6*  CL 102 106 101  CO2 33* 27 28  BUN 44* 42* 27*  CREATININE 0.93 1.05 0.87  GLUCOSE 99 97 183*  Glucose  Recent Labs Lab 07/25/14 1241 07/25/14 1705 07/25/14 2026 07/26/14 0035 07/26/14 0433 07/26/14 0803  GLUCAP 218* 310* 289* 263* 185* 130*   Imaging No results found.   ASSESSMENT / PLAN:  VF/VT arrest. 10-15 minutes to ROSC -->refused cardiac cath and all cardiac evaluation  Circulatory shock - cardiogenic, resolved.   Pulmonary Edema--> improved Plan: Continue current medical management  No cards f/u   Tracheostomy status s/p cardiopulmonary arrest 9/26 (trach placed) Plan Down size trach 10/1 to 6 cuffless Eventually consider capping trials and decannulation     Pulmonary infiltrates R>L  > favor pulm edema   PAH - noted on ECHO, PA peak 71, R/O PE Tobacco Abuse Plan:   Wean O2 Cont diuresis as tolerated  Smoking cessation  Pulm hygiene   Acute on chronic renal failure (baseline scr 1.5-2.0) Hypernatremia 151 on 9/26 Plan Follow up BMP Lasix reduced to 20mg  qd (9/21)-watch Na & BUN  Obesity  Dysphagia  PLAN Dysphagia diet    Anemia  Plan F/u CBC   Sinusitis - CT neck\head showed extensive sinus disease and left mastoid effusion Plan Complete day 5/10 abx, currently on augmentin as of 9/28   DM plan SSI protocol, on lantus 40 bid  Deconditioning  Plan [facility] for in-patient rehab.    Patient with complex situation socially, his children are mentally disabled and are being taken care of by other individuals, his parents also do have the mental capacity to make decisions for him, leaving his sister to make medical decisions on his behalf.  Currently he is unsure about having procedures done that can assist with management and treatment of his underlying conditions.  Including such procedures as cardiac catherization.    He has completely refused cardiac cath at this time.  He did agree to tracheostomy and is now weaning on ATC 28%. Likely will need CIR/ SNF placement.   Oretha MilchALVA,RAKESH V. MD

## 2014-07-26 NOTE — Progress Notes (Signed)
Speech Language Pathology Treatment: Dysphagia;Passy Muir Speaking valve  Patient Details Name: Richard LoosenJimmie G Laski MRN: [id number] DOB: [birth year] Today's Date: 07/26/2014 Time: [id number] SLP Time Calculation (min): 19 min  Assessment / Plan / Recommendation Clinical Impression  Skilled dysphagia and expressive communication with speaking valve during late breakfast meal.  Vital signs remained stable during session with intelligible but mildly hoarse speech in conversation.  Delayed throat clears possibly indicative of penetration versus pulmonary source.  Lung sounds documented by RN have not significantly changed, pt. remains afebrile.  SLP educated pt. re: goal of returning to thin liquids and likely etiology of pharyngeal dysphagia.  SLP will attempt trials of thin during next session.   HPI HPI: 53 y.o. [facility] (presented to hospital with hyperkalemia and altered mental status). 07/11/14 cardiac arrest and intubated; extubated and reintubated 07/15/14.  Trach 9/24. Acute on chronic renal failure.  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 CABG), GERD, COPD, diabetes, chronic kidney disease    Pertinent Vitals Pain Assessment: No/denies pain  SLP Plan  Continue with current plan of care    Recommendations Diet recommendations: Dysphagia 2 (fine chop);Nectar-thick liquid Liquids provided via: Cup;Straw Medication Administration: Whole meds with puree Supervision: Patient able to self feed;Intermittent supervision to cue for compensatory strategies Compensations: Slow rate;Small sips/bites Postural Changes and/or Swallow Maneuvers: Seated upright 90 degrees;Out of bed for meals      Patient may use Passy-Muir Speech Valve: During PO intake/meals;During all waking hours (remove during sleep) PMSV Supervision: Intermittent MD: Please consider changing trach tube to : Cuffless       Oral Care Recommendations: Oral care BID Follow up  Recommendations:  (TBD) Plan: Continue with current plan of care    GO     Richard [name], Richard [name] 07/26/2014, 10:52 AM  Breck CoonsLisa [name] Lonell FaceLitaker M.Ed [employer]-SLP Pager [phone]

## 2014-07-26 NOTE — PMR Pre-admission (Signed)
PMR Admission Coordinator Pre-Admission Assessment  Patient: Richard [name] is an 53 y.o., male MRN: [id number] DOB: 1961/09/25 Height: 5\' 3"  (160 cm) Weight: 93 kg (205 lb 0.4 oz)              Insurance Information HMO:      PPO:       PCP:       IPA:       80/20:       OTHER:   PRIMARY: Medicaid [place] access      Policy#: [id number] n      Subscriber: Richard PieriniJimmie Erno CM Name:        Phone#:       Fax#:   Pre-Cert#:        Employer: Disabled Benefits:  Phone #: [phone]     Name: Automated Eff. Date: 07/26/14 eligibility verified     Deduct:        Out of Pocket Max:        Life Max: CIR:        SNF:   Outpatient:       Co-Pay:   Home Health:        Co-Pay:   DME:       Co-Pay:   Providers:     Emergency Contact Information Contact Information   Name Relation Home Work Mobile   EmlentonSnyder,Cindy [name]   [phone]   Richard [name],Richard [name] [phone]     Richard [name],Richard [name] [phone]       Current Medical History  Patient Admitting Diagnosis:  Deconditioned after cardiac arrest, resp failure  History of Present Illness: A 53 y.o. right hand male multi-medical with history of diastolic congestive heart failure, tobacco abuse, coronary artery disease with CABG, COPD, type 2 diabetes mellitus, chronic renal insufficiency with baseline creatinine 1.5-2.0. Independent with a cane due to back pain prior to admission As well his complex social situation as his children are mentally disabled and being taken care of by other individuals at this time. [facility] with generalized weakness findings of hyperkalemia and altered mental status as well white blood cell count 14,000 blood pressure elevated 199/114. He was treated with Kayexalate for hyperkalemia. While undergoing echocardiogram suddenly developedVF/VT arrest. CT angiogram chest negative for pulmonary emboli. Required intubation for respiratory failure with placement of  percutaneous tracheostomy 07/21/2014 per critical care medicine. Patient refused cardiac catheterization to evaluate his coronary artery disease. Subcutaneous heparin for DVT prophylaxis. Currently on a dysphagia 2 nectar thick liquids. Physical therapy evaluation completed an ongoing with recommendations of physical medicine rehabilitation consult.    Past Medical History  Past Medical History  Diagnosis Date  . CAD (coronary artery disease) of artery bypass graft 07/11/2014  . Cardiac arrest 07/11/2014  . CHF (congestive heart failure) 07/11/2014  . HTN (hypertension) 07/11/2014  . COPD (chronic obstructive pulmonary disease) 07/11/2014  . DM (diabetes mellitus) 07/11/2014  . GERD (gastroesophageal reflux disease) 07/11/2014  . Acute on chronic kidney failure 07/11/2014    Family History  family history includes Heart attack in his father; Hypertension in his [name].  Prior Rehab/Hospitalizations:  [facility]   Current Medications  Current facility-administered medications:0.9 %  sodium chloride infusion, , Intravenous, PRN, Richard AcreVishal Mungal, MD, Last Rate: 10 mL/hr at 07/25/14 0527, 500 mL at 07/25/14 0527;  amoxicillin-clavulanate (AUGMENTIN) 875-125 MG per tablet 1 tablet, 1 tablet, Oral, Q12H, Richard Mourningakesh Alva V, MD, 1 tablet at 07/26/14 0919 antiseptic oral rinse (CPC /  CETYLPYRIDINIUM CHLORIDE 0.05%) solution 7 mL, 7 mL, Mouth Rinse, QID, Richard Leash, MD, 7 mL at 07/26/14 0400;  atorvastatin (LIPITOR) tablet 80 mg, 80 mg, Per Tube, q1800, Richard Gess, MD, 80 mg at 07/25/14 1703;  chlorhexidine (PERIDEX) 0.12 % solution 15 mL, 15 mL, Mouth Rinse, BID, Richard Leash, MD, 15 mL at 07/26/14 0919 feeding supplement (PRO-STAT SUGAR FREE 64) liquid 60 mL, 60 mL, Oral, BID, Richard [name], RD;  fentaNYL (SUBLIMAZE) bolus via infusion 50-100 mcg, 50-100 mcg, Intravenous, Q1H PRN, Richard Peer, MD;  furosemide (LASIX) injection 20 mg, 20 mg, Intravenous, Daily,  Vishal Mungal, MD, 20 mg at 07/26/14 0922;  heparin injection 5,000 Units, 5,000 Units, Subcutaneous, 3 times per day, Richard Acre, MD, 5,000 Units at 07/26/14 0539 hydrALAZINE (APRESOLINE) injection 10 mg, 10 mg, Intravenous, 3 times per day, Richard Leash, MD, 10 mg at 07/26/14 0539;  Influenza vac split quadrivalent PF (FLUARIX) injection 0.5 mL, 0.5 mL, Intramuscular, Prior to discharge, Richard Leash, MD;  insulin aspart (novoLOG) injection 0-20 Units, 0-20 Units, Subcutaneous, 6 times per day, Richard Katos, MD, 3 Units at 07/26/14 0920 insulin glargine (LANTUS) injection 40 Units, 40 Units, Subcutaneous, BID, Richard Katos, MD, 40 Units at 07/26/14 [phone];  labetalol (NORMODYNE,TRANDATE) injection 10 mg, 10 mg, Intravenous, Q4H PRN, Richard Acre, MD, 10 mg at 07/18/14 1206;  midazolam (VERSED) injection 2 mg, 2 mg, Intravenous, Q15 min PRN, Richard Peer, MD, 2 mg at 07/20/14 0415 midazolam (VERSED) injection 2 mg, 2 mg, Intravenous, Q2H PRN, Richard Peer, MD, 2 mg at 07/20/14 1849;  pantoprazole (PROTONIX) EC tablet 40 mg, 40 mg, Oral, Daily, Richard Bucks, MD, 40 mg at 07/26/14 0919;  RESOURCE THICKENUP CLEAR, , Oral, PRN, Kalman Shan, MD  Patients Current Diet: Dysphagia  Precautions / Restrictions Precautions Precautions: Fall Precaution Comments: h/o falls PTA per chart Restrictions Weight Bearing Restrictions: No   Prior Activity Level Household: Mostly homebound, but went to [place] twice a month.    Home Assistive Devices / Equipment Home Assistive Devices/Equipment: None  Prior Functional Level Prior Function Level of Independence: Independent with assistive device(s) Comments: pt indicates (via yes/no questions) he used a cane PTA due to back pain; had 2 falls PTA per chart; indicates he does not live alone  Current Functional Level Cognition  Overall Cognitive Status: Within Functional Limits for tasks assessed Difficult to assess due to:  Tracheostomy Orientation Level: Oriented to person;Oriented to place;Oriented to time    Extremity Assessment (includes Sensation/Coordination)  Upper Extremity Assessment: Overall WFL for tasks assessed  Lower Extremity Assessment: Overall WFL for tasks assessed [facility] and noted Rt ankle DF -20)   ADLs       Mobility  Overal bed mobility: Needs Assistance Bed Mobility: Supine to Sit Supine to sit: Mod assist;HOB elevated General bed mobility comments: on ICU/air mattress; step by step vc for sequencing, assist to move Legs and raise trunk due to weakness    Transfers  Overall transfer level: Needs assistance Equipment used: Rolling walker (2 wheeled) Transfers: Sit to/from Stand Sit to Stand: Min assist;+2 physical assistance;+2 safety/equipment Stand pivot transfers: +2 physical assistance;Mod assist General transfer comment: stood total of 3 minutes while pericare performed and linens in chair changed; worked on upright posture    Ambulation / Gait / Stairs / Wheelchair Mobility  Ambulation/Gait Ambulation/Gait assistance: Min assist;+2 safety/equipment Ambulation Distance (Feet): 15 Feet Assistive device: Rolling walker (2 wheeled) Gait Pattern/deviations: Step-through pattern;Decreased stride  length;Trunk flexed Gait velocity interpretation: Below normal speed for age/gender General Gait Details: pushed RW much too far ahead of himself and needed assist to stabilize RW while he stepped himself closer to it    Posture / Balance Overall balance assessment: Needs assistance  Sitting-balance support: No upper extremity supported;Feet supported  Sitting balance-Leahy Scale: Fair  Standing balance support: Bilateral upper extremity supported  Standing balance-Leahy Scale: Poor   Special needs/care consideration BiPAP/CPAP Had CPAP at one time, but medicaid does not cover it and he could not afford it. CPM No Continuous Drip IV No Dialysis No       Life Vest No Oxygen Yes,  trach collar.  Wears O2 at night at home. Special Bed No Trach Size Yes, #6 Shiley cuffed with PMV Wound Vac (area) No     Skin Skin tear right lower leg, blister on lip, and buttocks are reddened                               Bowel mgmt: Last BM 07/26/14 Bladder mgmt: Voiding up on [facility] with assistance Diabetic mgmt Yes, on insulin and oral medications at home.  Patient says that he has diabetic shoes.    Previous Home Environment Living Arrangements: Other relatives Home Care Services: No Additional Comments: no family present  Discharge Living Setting Plans for Discharge Living Setting: Lives with (comment);Mobile Home (Lives with [name].)  Richard [name] appears mentally challenged, but answers questions appropriately. Type of Home at Discharge: Mobile home Discharge Home Layout: One level Discharge Home Access: Stairs to enter Entrance Stairs-Number of Steps: 5-7 steps at front entrance. Does the patient have any problems obtaining your medications?: No  Social/Family/Support Systems Contact Information: Richard Daft - [name] [phone] Anticipated Caregiver: self and [name] Ability/Limitations of Caregiver: [name] lives with patient.  [name] says they cannot help each other much. Caregiver Availability: Intermittent Discharge Plan Discussed with Primary Caregiver: Yes Is Caregiver In Agreement with Plan?: Yes Does Caregiver/Family have Issues with Lodging/Transportation while Pt is in Rehab?: No  Goals/Additional Needs Patient/Family Goal for Rehab: PT/OT mod I and supervision, ST mod I goals Expected length of stay: 10-12 days Cultural Considerations: None Dietary Needs: Dys II, nectar thick, carb modified diet Equipment Needs: TBD Additional Information: Anders Simmonds, NP, will be up on 10/01 to downsize trach to #4 or to cuffless. Pt/Family Agrees to Admission and willing to participate: Yes Program Orientation Provided & Reviewed with Pt/Caregiver Including Roles  &  Responsibilities: Yes  Decrease burden of Care through IP rehab admission: N/A  Possible need for SNF placement upon discharge: Not planned  Patient Condition: This patient's condition remains as documented in the consult dated 07/25/14, in which the Rehabilitation Physician determined and documented that the patient's condition is appropriate for intensive rehabilitative care in an inpatient rehabilitation facility. Will admit to inpatient rehab today.  Preadmission Screen Completed By:  Trish Mage, 07/26/2014 12:05 PM ______________________________________________________________________   Discussed status with Dr. Riley Kill on 07/26/14 at 1204 and received telephone approval for admission today.  Admission Coordinator:  Trish Mage, time1204/Date09/29/15

## 2014-07-27 ENCOUNTER — Inpatient Hospital Stay (HOSPITAL_COMMUNITY): Payer: Medicaid Other | Admitting: Rehabilitation

## 2014-07-27 ENCOUNTER — Inpatient Hospital Stay (HOSPITAL_COMMUNITY): Payer: Medicaid Other | Admitting: Speech Pathology

## 2014-07-27 ENCOUNTER — Inpatient Hospital Stay (HOSPITAL_COMMUNITY): Payer: Medicaid Other | Admitting: Occupational Therapy

## 2014-07-27 DIAGNOSIS — E119 Type 2 diabetes mellitus without complications: Secondary | ICD-10-CM

## 2014-07-27 DIAGNOSIS — R5381 Other malaise: Secondary | ICD-10-CM

## 2014-07-27 DIAGNOSIS — J9600 Acute respiratory failure, unspecified whether with hypoxia or hypercapnia: Secondary | ICD-10-CM

## 2014-07-27 DIAGNOSIS — I509 Heart failure, unspecified: Secondary | ICD-10-CM

## 2014-07-27 LAB — CBC WITH DIFFERENTIAL/PLATELET
BASOS PCT: 1 % (ref 0–1)
Basophils Absolute: 0.1 10*3/uL (ref 0.0–0.1)
EOS PCT: 3 % (ref 0–5)
Eosinophils Absolute: 0.3 10*3/uL (ref 0.0–0.7)
HEMATOCRIT: 39.3 % (ref 39.0–52.0)
HEMOGLOBIN: 12.4 g/dL — AB (ref 13.0–17.0)
Lymphocytes Relative: 23 % (ref 12–46)
Lymphs Abs: 2 10*3/uL (ref 0.7–4.0)
MCH: 28.5 pg (ref 26.0–34.0)
MCHC: 31.6 g/dL (ref 30.0–36.0)
MCV: 90.3 fL (ref 78.0–100.0)
MONO ABS: 0.5 10*3/uL (ref 0.1–1.0)
MONOS PCT: 5 % (ref 3–12)
Neutro Abs: 5.9 10*3/uL (ref 1.7–7.7)
Neutrophils Relative %: 68 % (ref 43–77)
Platelets: 366 10*3/uL (ref 150–400)
RBC: 4.35 MIL/uL (ref 4.22–5.81)
RDW: 15.9 % — ABNORMAL HIGH (ref 11.5–15.5)
WBC: 8.7 10*3/uL (ref 4.0–10.5)

## 2014-07-27 LAB — GLUCOSE, CAPILLARY
Glucose-Capillary: 122 mg/dL — ABNORMAL HIGH (ref 70–99)
Glucose-Capillary: 167 mg/dL — ABNORMAL HIGH (ref 70–99)
Glucose-Capillary: 175 mg/dL — ABNORMAL HIGH (ref 70–99)
Glucose-Capillary: 193 mg/dL — ABNORMAL HIGH (ref 70–99)
Glucose-Capillary: 198 mg/dL — ABNORMAL HIGH (ref 70–99)
Glucose-Capillary: 216 mg/dL — ABNORMAL HIGH (ref 70–99)
Glucose-Capillary: 249 mg/dL — ABNORMAL HIGH (ref 70–99)
Glucose-Capillary: 253 mg/dL — ABNORMAL HIGH (ref 70–99)

## 2014-07-27 LAB — COMPREHENSIVE METABOLIC PANEL
ALBUMIN: 3 g/dL — AB (ref 3.5–5.2)
ALT: 29 U/L (ref 0–53)
ANION GAP: 13 (ref 5–15)
AST: 17 U/L (ref 0–37)
Alkaline Phosphatase: 135 U/L — ABNORMAL HIGH (ref 39–117)
BILIRUBIN TOTAL: 0.7 mg/dL (ref 0.3–1.2)
BUN: 25 mg/dL — AB (ref 6–23)
CALCIUM: 9 mg/dL (ref 8.4–10.5)
CHLORIDE: 100 meq/L (ref 96–112)
CO2: 28 mEq/L (ref 19–32)
CREATININE: 0.83 mg/dL (ref 0.50–1.35)
GFR calc Af Amer: >90 mL/min (ref >90)
GFR calc non Af Amer: >90 mL/min (ref >90)
Glucose, Bld: 145 mg/dL — ABNORMAL HIGH (ref 70–99)
Potassium: 3.6 mEq/L — ABNORMAL LOW (ref 3.7–5.3)
Sodium: 141 mEq/L (ref 137–147)
Total Protein: 7.3 g/dL (ref 6.0–8.3)

## 2014-07-27 MED ORDER — INSULIN GLARGINE 100 UNIT/ML ~~LOC~~ SOLN
40.0000 [IU] | Freq: Every day | SUBCUTANEOUS | Status: DC
  Administered 2014-07-29 – 2014-07-30 (×2): 40 [IU] via SUBCUTANEOUS
  Filled 2014-07-27 (×4): qty 0.4

## 2014-07-27 MED ORDER — LORAZEPAM 2 MG/ML IJ SOLN
0.2500 mg | Freq: Four times a day (QID) | INTRAMUSCULAR | Status: DC | PRN
  Administered 2014-07-27: 0.25 mg via INTRAMUSCULAR
  Filled 2014-07-27: qty 1

## 2014-07-27 MED ORDER — LORAZEPAM 0.5 MG PO TABS: 0.2500 mg | ORAL_TABLET | Freq: Four times a day (QID) | ORAL | Status: DC | PRN

## 2014-07-27 MED ORDER — INSULIN GLARGINE 100 UNIT/ML ~~LOC~~ SOLN
50.0000 [IU] | Freq: Every day | SUBCUTANEOUS | Status: DC
  Administered 2014-07-27 – 2014-08-02 (×6): 50 [IU] via SUBCUTANEOUS
  Filled 2014-07-27 (×7): qty 0.5

## 2014-07-27 NOTE — Progress Notes (Addendum)
[facility] PHYSICAL MEDICINE & REHABILITATION     PROGRESS NOTE    Subjective/Complaints: Had some coughing and secretions last night. Needed suction. Slept in chair. Denies pain A  review of systems has been performed and if not noted above is otherwise negative.   Objective: Vital Signs: Blood pressure 112/59, pulse 85, temperature 99.1 F (37.3 C), temperature source Oral, resp. rate 18, height 5\' 2"  (1.575 m), weight 94 kg (207 lb 3.7 oz), SpO2 95.00%. No results found.  Recent Labs  07/26/14 0428 07/26/14 1948  WBC 9.4 12.2*  HGB 12.4* 12.1*  HCT 38.4* 38.1*  PLT 427* 370    Recent Labs  07/26/14 0428 07/26/14 1948  NA 141  --   K 3.6*  --   CL 101  --   GLUCOSE 183*  --   BUN 27*  --   CREATININE 0.87 0.84  CALCIUM 8.5  --    CBG (last 3)   Recent Labs  07/26/14 2034 07/27/14 0002 07/27/14 0356  GLUCAP 253* 249* 198*    Wt Readings from Last 3 Encounters:  07/27/14 94 kg (207 lb 3.7 oz)  07/26/14 93 kg (205 lb 0.4 oz)  07/26/14 93 kg (205 lb 0.4 oz)    Physical Exam:  Constitutional:  No acute distress.  HENT: orlal mucosa pink/moist  Head: Normocephalic.  Eyes: EOM are normal.  Neck:  #6 cuffed trach in place---cuff deflated--secretions dripping from trach.   Cardiovascular:  Cardiac rate controlled. No murmurs. Reg rhythm  Respiratory:  Scattered rhonchi and wet breath sounds, +thin, secretions, a few wheezes, no respiratory distress  GI: Soft. Bowel sounds are normal. He exhibits no distension.  Neurological: He is alert.  Patient is able to provide his name, age and date of birth. Follows simple commands. CN exam grossly intact. UE: 3+ delt, 4/5 bicep/tricep/HI. LE's: 3/5 HF, 3+KE, 4- ankles. No gross sensory changes. Good sitting balance.  Psychiatric:  Flat, cooperative  Skin: a few scattered echhymoses on limbs.--- Assessment/Plan: 1. Functional deficits secondary to severe deconditioning after respiratory failure which require 3+  hours per day of interdisciplinary therapy in a comprehensive inpatient rehab setting. Physiatrist is providing close team supervision and 24 hour management of active medical problems listed below. Physiatrist and rehab team continue to assess barriers to discharge/monitor patient progress toward functional and medical goals. FIM:                   Comprehension Comprehension Mode: Auditory Comprehension: 6-Follows complex conversation/direction: With extra time/assistive device  Expression Expression Mode: Verbal Expression: 7-Expresses complex ideas: With no assist  Social Interaction Social Interaction: 7-Interacts appropriately with others - No medications needed.  Problem Solving Problem Solving: 5-Solves complex 90% of the time/cues < 10% of the time  Memory Memory: 7-Complete Independence: No helper  Medical Problem List and Plan:  1. Functional deficits secondary to debilitation after cardiac arrest/respiratory failure.  2. 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 Plan to downsize to a #6 cuffless versus #4 trach 07/28/2014   -oob  -IS/FV 2. DVT Prophylaxis/Anticoagulation: Subcutaneous heparin. Monitor platelet counts and any signs of bleeding  3. Pain Management: Tylenol as needed  4. Dysphagia. Dysphagia 2 nectar liquids. Followup speech therapy. Monitor for any aspiration  routine5. Neuropsych: This patient is capable of making decisions on his own behalf.  6. Skin/Wound Care: Routine skin care. Trach care as directed  7. Fluids/Electrolytes/Nutrition:  Following weights and I's and o's  8. CAD/CABG. Patient has refused cardiac catheterization in  the past  9. Diastolic congestive heart failure. Continue Lasix as directed. Monitor for any signs of fluid overload  10. Diabetes mellitus with peripheral neuropathy. Lantus insulin 40 units twice a day. Check blood sugars a.c. and at bedtime   -will adjust to 50u qam and 40 u qpm 11. Tobacco abuse.  Provide counseling  12. Chronic renal insufficiency. Baseline creatinine 1.5 did 2.0.   13. Extensive sinus disease left mastoid. Continue Augmentin x5 more days and stop   -wbc's still elevated 12.2    LOS (Days) 1 A FACE TO FACE EVALUATION WAS PERFORMED  Margrett Kalb T 07/27/2014 8:00 AM

## 2014-07-27 NOTE — Progress Notes (Signed)
Nursing Note; pt suctioned x1 for moderate amount of clear thin secretions. Pt tolerated well.wbb

## 2014-07-27 NOTE — Progress Notes (Signed)
RT unavailable for 12:00 trach check due to transporting another pt.

## 2014-07-27 NOTE — Evaluation (Signed)
Physical Therapy Assessment and Plan  Patient Details  Name: Richard [name] MRN: [id number] Date of Birth: 04/24/1961  PT Diagnosis: Difficulty walking, Edema, Muscle weakness (generalized) and Pain in back (chronic) Rehab Potential: Good ELOS: 14 days   Today's Date: 07/27/2014 PT Individual Time: 1500-1610 PT Individual Time Calculation (min): 70 min    Problem List:  Patient Active Problem List   Diagnosis Date Noted  . Debilitated 07/26/2014  . Acute on chronic kidney failure 07/11/2014  . Hyperkalemia 07/11/2014  . CHF (congestive heart failure) 07/11/2014  . DM (diabetes mellitus) 07/11/2014  . CAD (coronary artery disease) of artery bypass graft 07/11/2014  . HTN (hypertension) 07/11/2014  . COPD (chronic obstructive pulmonary disease) 07/11/2014  . GERD (gastroesophageal reflux disease) 07/11/2014  . Acute encephalopathy 07/11/2014  . CAP (community acquired pneumonia) 07/11/2014  . Cardiac arrest 07/11/2014    Past Medical History:  Past Medical History  Diagnosis Date  . CAD (coronary artery disease) of artery bypass graft 07/11/2014  . Cardiac arrest 07/11/2014  . CHF (congestive heart failure) 07/11/2014  . HTN (hypertension) 07/11/2014  . COPD (chronic obstructive pulmonary disease) 07/11/2014  . DM (diabetes mellitus) 07/11/2014  . GERD (gastroesophageal reflux disease) 07/11/2014  . Acute on chronic kidney failure 07/11/2014   Past Surgical History: No past surgical history on file.  Assessment & Plan Clinical Impression: Patient is a 53 y.o. year old male with multi-medical with history of diastolic congestive heart failure, tobacco abuse, coronary artery disease with CABG, COPD, type 2 diabetes mellitus, chronic renal insufficiency with baseline creatinine 1.5-2.0. Independent with a cane due to back pain prior to admission but has a complex social situation as his children are mentally disabled and being taken care of by other individuals at this time.  Admitted from [facility] to [facility] 07/11/2014 with generalized weakness findings of hyperkalemia and altered mental status as well white blood cell count 14,000 blood pressure elevated 199/114. He was treated with Kayexalate for hyperkalemia. While undergoing echocardiogram suddenly developed VF/VT arrest. CT angiogram chest negative for pulmonary emboli. Required intubation for respiratory failure with placement of percutaneous tracheostomy 07/21/2014 per critical care medicine. Patient refused cardiac catheterization to evaluate his coronary artery disease. Plan is to downsize tracheostomy tube 07/28/2014. Subcutaneous heparin for DVT prophylaxis. Maintained on Augmentin x10 days total for findings of extensive sinus disease. Currently on a dysphagia 2 nectar thick liquids. Patient transferred to CIR on 07/26/2014 .   Patient currently requires min with mobility secondary to muscle weakness, decreased cardiorespiratoy endurance and decreased oxygen support and decreased awareness and decreased memory.  Prior to hospitalization, patient was modified independent  with mobility and lived with Family in a Mobile home home.  Home access is 5Stairs to enter.  Patient will benefit from skilled PT intervention to maximize safe functional mobility, minimize fall risk and decrease caregiver burden for planned discharge home with intermittent assist.  Anticipate patient will benefit from follow up HH at discharge.  PT - End of Session Activity Tolerance: Tolerates < 10 min activity, no significant change in vital signs Endurance Deficit: Yes Endurance Deficit Description: Pt significantly impaired by decreased endurance.  PT Assessment Rehab Potential: Good Barriers to Discharge: Decreased caregiver support PT Patient demonstrates impairments in the following area(s): Balance;Endurance;Motor;Safety PT Transfers Functional Problem(s): Bed Mobility;Bed to Chair;Car;Furniture PT Locomotion  Functional Problem(s): Ambulation;Wheelchair Mobility;Stairs PT Plan PT Intensity: Minimum of 1-2 x/day ,45 to 90 minutes PT Frequency: 5 out of 7 days PT Duration Estimated Length   of Stay: 14 days PT Treatment/Interventions: Ambulation/gait training;Balance/vestibular training;Discharge planning;Disease management/prevention;DME/adaptive equipment instruction;Functional mobility training;Pain management;Patient/family education;Stair training;Therapeutic Activities;Therapeutic Exercise;UE/LE Strength taining/ROM;Wheelchair propulsion/positioning PT Transfers Anticipated Outcome(s): mod I PT Locomotion Anticipated Outcome(s): mod I, S for stairs and car transfer PT Recommendation Follow Up Recommendations: Home health PT Patient destination: Home Equipment Recommended: To be determined  Skilled Therapeutic Intervention PT assessment and evaluation completed, see full details below.  Pt very limited in session by decreased endurance and need to urinate frequently.  Initiated gait training with use of RW and pt able to ambulate up to 30' x 4 reps during session.  Pt refused to perform bed mobility due to pain in chest and states that he slept in recliner last night.  He initially did not want to wear PMV due to increased work of breathing, however did tolerate intermittently during session but breaths very labored and sentences very short and exacerbated.  SaO2 remained in 90's throughout on 28%, 3L per trach collar.  Discussed LOS, expected outcome and concerns regarding D/C.  Pt left in room in recliner with all needs in reach.   PT Evaluation Precautions/Restrictions Precautions Precautions: Fall Precaution Comments: h/o falls PTA per chart, trach and O2 Restrictions Weight Bearing Restrictions: No General Chart Reviewed: Yes Family/Caregiver Present: No Vital SignsTherapy Vitals Temp: 98.8 F (37.1 C) Temp src: Oral Pulse Rate: 88 Resp: 19 BP: 120/60 mmHg Patient Position (if  appropriate): Sitting Oxygen Therapy SpO2: 96 % O2 Device: Trach collar O2 Flow Rate (L/min): 5 L/min FiO2 (%): 28 % Pain Pain Assessment Pain Assessment: Faces Faces Pain Scale: Hurts little more Pain Type: Acute pain Pain Location: Chest Pain Orientation: Medial Pain Descriptors / Indicators: Aching Pain Intervention(s): RN made aware Home Living/Prior Functioning Home Living Available Help at Discharge: Family;Available PRN/intermittently Type of Home: Mobile home Home Access: Stairs to enter Entrance Stairs-Number of Steps: 5 Entrance Stairs-Rails: Right;Left;Can reach both Home Layout: One level  Lives With: Family Prior Function Level of Independence: Independent with basic ADLs;Requires assistive device for independence;Needs assistance with homemaking Driving: Yes (not since 6 weeks ago) Vocation: On disability Comments: he used a cane PTA due to back pain; had 2 falls PTA (fell from deck and on some roots) Vision/Perception     Cognition Overall Cognitive Status: Within Functional Limits for tasks assessed Arousal/Alertness: Awake/alert Orientation Level: Oriented X4 Attention: Selective Sustained Attention: Appears intact Selective Attention: Appears intact Memory: Impaired Memory Impairment: Decreased recall of new information Decreased Short Term Memory: Functional basic Awareness: Impaired Awareness Impairment: Emergent impairment Safety/Judgment: Appears intact Comments: Pt able to verbalize that he needs to call for assist when getting up in room.  Sensation Sensation Light Touch: Appears Intact Stereognosis: Not tested Hot/Cold: Not tested Proprioception: Appears Intact Coordination Gross Motor Movements are Fluid and Coordinated: Yes (in LE's) Fine Motor Movements are Fluid and Coordinated: Yes Heel Shin Test: decreased speed and fluidity of movement, likely due to swelling and body habitus.  Motor  Motor Motor: Within Functional Limits Motor  - Skilled Clinical Observations: decreased endurance and balance  Mobility Bed Mobility Bed Mobility: Not assessed (pt refused to perform due to pain) Transfers Transfers: Yes Sit to Stand: 4: Min guard;4: Min assist Sit to Stand Details: Verbal cues for sequencing;Verbal cues for technique;Verbal cues for precautions/safety Stand to Sit: 4: Min assist;4: Min guard Stand to Sit Details (indicate cue type and reason): Verbal cues for sequencing;Verbal cues for technique;Verbal cues for precautions/safety Stand Pivot Transfers: 4: Min assist Stand Pivot Transfer Details:   Verbal cues for sequencing;Verbal cues for technique;Verbal cues for precautions/safety;Manual facilitation for weight shifting Locomotion  Ambulation Ambulation: Yes Ambulation/Gait Assistance: 4: Min guard;4: Min assist Ambulation Distance (Feet): 30 Feet Assistive device: Rolling walker Ambulation/Gait Assistance Details: Verbal cues for sequencing;Verbal cues for technique;Verbal cues for precautions/safety;Verbal cues for gait pattern;Verbal cues for safe use of DME/AE Gait Gait: Yes Gait Pattern: Impaired Gait Pattern: Step-through pattern;Decreased stride length;Trunk flexed;Wide base of support Stairs / Additional Locomotion Stairs: Yes Stairs Assistance: 4: Min assist Stairs Assistance Details: Verbal cues for sequencing;Verbal cues for technique;Verbal cues for precautions/safety Stair Management Technique: Two rails;Alternating pattern;Forwards Number of Stairs: 5 Height of Stairs: 4 Wheelchair Mobility Wheelchair Mobility: Yes Wheelchair Assistance: 5: Supervision Wheelchair Assistance Details: Verbal cues for sequencing;Verbal cues for technique Wheelchair Propulsion: Both upper extremities Wheelchair Parts Management: Needs assistance Distance: 100  Trunk/Postural Assessment  Cervical Assessment Cervical Assessment: Exceptions to WFL (limited due to trach) Thoracic Assessment Thoracic Assessment:  Exceptions to WFL (rigid and stiff) Lumbar Assessment Lumbar Assessment: Exceptions to WFL (rigid and stiff) Postural Control Postural Control: Deficits on evaluation Postural Limitations: Pt with little movement in lumbar spine, posterior pelvic tilt in sitting and standing.   Balance Balance Balance Assessed: Yes Dynamic Sitting Balance Dynamic Sitting - Balance Support: Feet supported Dynamic Sitting - Level of Assistance: 5: Stand by assistance Static Standing Balance Static Standing - Balance Support: During functional activity Static Standing - Level of Assistance: 4: Min assist Dynamic Standing Balance Dynamic Standing - Balance Support: During functional activity Dynamic Standing - Level of Assistance: 4: Min assist Extremity Assessment      RLE Assessment RLE Assessment: Within Functional Limits (weak hip flex, not full range due to body habitus) LLE Assessment LLE Assessment: Within Functional Limits (weak hip flex, not full ROM due to body habitus)  FIM:  FIM - Bed/Chair Transfer Bed/Chair Transfer Assistive Devices:  (Pt refused due to pain in chest) Bed/Chair Transfer: 0: Activity did not occur FIM - Locomotion: Wheelchair Distance: 100 Locomotion: Wheelchair: 2: Travels 50 - 149 ft with supervision, cueing or coaxing FIM - Locomotion: Ambulation Locomotion: Ambulation Assistive Devices: Walker - Rolling Ambulation/Gait Assistance: 4: Min guard;4: Min assist Locomotion: Ambulation: 1: Travels less than 50 ft with minimal assistance (Pt.>75%) FIM - Locomotion: Stairs Locomotion: Stairs Assistive Devices: Hand rail - 2 Locomotion: Stairs: 2: Up and Down 4 - 11 stairs with minimal assistance (Pt.>75%)   Refer to Care Plan for Long Term Goals  Recommendations for other services: None  Discharge Criteria: Patient will be discharged from PT if patient refuses treatment 3 consecutive times without medical reason, if treatment goals not met, if there is a change in  medical status, if patient makes no progress towards goals or if patient is discharged from hospital.  The above assessment, treatment plan, treatment alternatives and goals were discussed and mutually agreed upon: by patient  Parcell, Emily Ann 07/27/2014, 7:16 PM  

## 2014-07-27 NOTE — Progress Notes (Signed)
Patient information reviewed and entered into eRehab system by Ayrianna Mcginniss, RN, CRRN, PPS Coordinator.  Information including medical coding and functional independence measure will be reviewed and updated through discharge.    

## 2014-07-27 NOTE — Evaluation (Signed)
Occupational Therapy Assessment and Plan  Patient Details  Name: Richard [name] MRN: [id number] Date of Birth: 01-Jun-1961  OT Diagnosis: H/O lumbago (low back pain), muscle weakness (generalized), decondition, edema, poor activity tolerance, and O2 dependent Rehab Potential: Rehab Potential: Good ELOS: 10-14 days   Today's Date: 07/27/2014 OT Individual Time: 0800-0900 OT Individual Time Calculation (min): 60 min     Problem List:  Patient Active Problem List   Diagnosis Date Noted  . Debilitated 07/26/2014  . Acute on chronic kidney failure 07/11/2014  . Hyperkalemia 07/11/2014  . CHF (congestive heart failure) 07/11/2014  . DM (diabetes mellitus) 07/11/2014  . CAD (coronary artery disease) of artery bypass graft 07/11/2014  . HTN (hypertension) 07/11/2014  . COPD (chronic obstructive pulmonary disease) 07/11/2014  . GERD (gastroesophageal reflux disease) 07/11/2014  . Acute encephalopathy 07/11/2014  . CAP (community acquired pneumonia) 07/11/2014  . Cardiac arrest 07/11/2014    Past Medical History:  Past Medical History  Diagnosis Date  . CAD (coronary artery disease) of artery bypass graft 07/11/2014  . Cardiac arrest 07/11/2014  . CHF (congestive heart failure) 07/11/2014  . HTN (hypertension) 07/11/2014  . COPD (chronic obstructive pulmonary disease) 07/11/2014  . DM (diabetes mellitus) 07/11/2014  . GERD (gastroesophageal reflux disease) 07/11/2014  . Acute on chronic kidney failure 07/11/2014   Past Surgical History: No past surgical history on file.  Assessment & Plan Clinical Impression: Richard [name] is a 53 y.o. right hand male multi-medical with history of diastolic congestive heart failure, tobacco abuse, coronary artery disease with CABG, COPD, type 2 diabetes mellitus, chronic renal insufficiency with baseline creatinine 1.5-2.0. Independent with a cane due to back pain prior to admission but has a complex social situation as his children are mentally  disabled and being taken care of by other individuals at this time. Admitted from [facility] to [facility] 07/11/2014 with generalized weakness findings of hyperkalemia and altered mental status as well white blood cell count 14,000 blood pressure elevated 199/114. He was treated with Kayexalate for hyperkalemia. While undergoing echocardiogram suddenly developed VF/VT arrest. CT angiogram chest negative for pulmonary emboli. Required intubation for respiratory failure with placement of percutaneous tracheostomy 07/21/2014 per critical care medicine. Patient refused cardiac catheterization to evaluate his coronary artery disease. Plan is to downsize tracheostomy tube 07/28/2014. Subcutaneous heparin for DVT prophylaxis. Maintained on Augmentin x10 days total for findings of extensive sinus disease. Currently on a dysphagia 2 nectar thick liquids.  Patient transferred to CIR on 07/26/2014 .    Patient currently requires max-min with basic self-care skills and IADL secondary to muscle weakness and muscle joint tightness, decreased cardiorespiratoy endurance and decreased oxygen support and decreased standing balance and decreased balance strategies.  Prior to hospitalization, patient reports that he and Richard [name] (his nephew that lives with him), share the chores and the cooking, patient drives (however his truck was not working for 3 weeks PTA), uses a cane to get around.  He reports falling at home 2 times recently-he fell off the deck and the second time he tripped on some roots.   Patient will benefit from skilled intervention to increase independence with basic self-care skills and increase level of independence with iADL prior to discharge home with care partner.  Anticipate patient will require 24 hour supervision and follow up home health.  OT - End of Session Activity Tolerance: Tolerates 30+ min activity with multiple rests Endurance Deficit: Yes OT Assessment Rehab Potential:  Good Barriers to Discharge: Decreased caregiver support  Barriers to Discharge Comments: Unsure of level of assistance/support the nephew (who lives with patient) can provide OT Patient demonstrates impairments in the following area(s): Balance;Edema;Endurance;Safety;Skin Integrity;Pain (h/o back pain) OT Basic ADL's Functional Problem(s): Grooming;Bathing;Dressing;Toileting OT Advanced ADL's Functional Problem(s): Light Housekeeping OT Transfers Functional Problem(s): Toilet;Tub/Shower OT Plan OT Intensity: Minimum of 1-2 x/day, 45 to 90 minutes OT Frequency: 5 out of 7 days OT Duration/Estimated Length of Stay: 10-14 days OT Treatment/Interventions: Teacher, English as a foreign language;Discharge planning;Disease mangement/prevention;Functional mobility training;Patient/family education;Self Care/advanced ADL retraining;Therapeutic Activities;Therapeutic Exercise;UE/LE Strength taining/ROM;UE/LE Coordination activities OT Basic Self-Care Anticipated Outcome(s): Supervision OT Toileting Anticipated Outcome(s): Supervision OT Bathroom Transfers Anticipated Outcome(s): Supervision OT Recommendation Patient destination: Home Follow Up Recommendations: Home health OT;24 hour supervision/assistance Equipment Recommended: None recommended by OT Equipment Details: patient reports that he has a shower chair.  BSC TBA  Skilled Therapeutic Intervention Patient seated in recliner upon arrival and told NT that he was having a hard time breathing therefore RN provided suction at beginning of OT session.  OT evaluation to include role of OT, goals collaborated with patient and overall expectations.  Engaged in self care retraining to include sponge bath and groom tasks.  Patient did not have clothes on eval day therefore his sister Richard [name] was called by this OT to have clothes brought in.  Patient was unable to wash feet or donn socks  OT Evaluation Precautions/Restrictions   Precautions Precautions: Fall Precaution Comments: h/o falls PTA per chart, trach and O2 Restrictions Weight Bearing Restrictions: No Pain No report of pain Home Living/Prior Functioning Home Living Available Help at Discharge: Family;Available PRN/intermittently Type of Home: Mobile home Home Access: Stairs to enter Entrance Stairs-Number of Steps: 5 Entrance Stairs-Rails: Left;Right Home Layout: One level Additional Comments: does not have grab bars or HH shower in the tub/shower combo.  He has a shower chair  Lives With: Family (Donald-nephew) Prior Function Level of Independence: Independent with basic ADLs;Requires assistive device for independence;Needs assistance with homemaking (patient and nephew shared the HM and cooking tasks) Driving: Yes Vocation: On disability Comments: he used a cane PTA due to back pain; had 2 falls PTA (fell from deck and on some roots) ADL Refer to FIM below Vision/Perception  Vision- History Baseline Vision/History: Wears glasses Wears Glasses: At all times Patient Visual Report: Blurring of vision  Cognition Overall Cognitive Status: Within Functional Limits for tasks assessed Arousal/Alertness: Awake/alert Orientation Level: Oriented X4 Sensation Sensation Light Touch: Impaired Detail Light Touch Impaired Details: Impaired RUE (thumb impaired) Stereognosis: Not tested Hot/Cold: Not tested Proprioception: Not tested Coordination Gross Motor Movements are Fluid and Coordinated: No Fine Motor Movements are Fluid and Coordinated: No Coordination and Movement Description: decreased speed accuracy and rhythm Finger Nose Finger Test: LUE FTN decreased speed accuracy and rhythm Motor  Motor Motor: Within Functional Limits Mobility  Transfers Transfers: Sit to Stand;Stand to Sit Sit to Stand: 4: Min assist Sit to Stand Details: Verbal cues for technique Stand to Sit: 4: Min assist Stand to Sit Details (indicate cue type and reason):  Verbal cues for technique  Trunk/Postural Assessment  Cervical Assessment Cervical Assessment: Exceptions to [facility] (limited due to trach) Thoracic Assessment Thoracic Assessment: Exceptions to [facility] (rigid and stiff) Lumbar Assessment Lumbar Assessment: Exceptions to [facility] (rigid and stiff) Postural Control Postural Control: Within Functional Limits  Balance Overall min assist with BADL Extremity/Trunk Assessment RUE Assessment RUE Assessment: Exceptions to [facility] RUE AROM (degrees) RUE Overall AROM Comments: Grossly WFL except end ranges of shoulder movements, reports h/o carpel tunnel  RUE Strength RUE Overall Strength Comments: Unable to fully assess shoulder strength due to reports of chest and back pain with MMT LUE Assessment LUE Assessment: Exceptions to WFL LUE AROM (degrees) LUE Overall AROM Comments: Shoulder flexion ~120 and with limited end ranges of shoulder movement, h/o carpel tunnel surgery "it didn't work" LUE PROM (degrees) LUE Overall PROM Comments: shoulder flexion WFL passively only, limited in end ranges of shoulder movement LUE Strength LUE Overall Strength Comments: Unable to fully assess shoulder strength due to reports of chest and back pain with MMT  FIM:  FIM - Grooming Grooming Steps: Wash, rinse, dry face;Wash, rinse, dry hands;Brush, comb hair Grooming: 3: Patient completes 2 of 4 or 3 of 5 steps FIM - Bathing Bathing Steps Patient Completed: Chest;Right Arm;Left Arm;Abdomen;Front perineal area Bathing: 3: Mod-Patient completes 5-7 55f 10 parts or 50-74% FIM - Upper Body Dressing/Undressing Upper body dressing/undressing: 0: Wears gown/pajamas-no public clothing FIM - Lower Body Dressing/Undressing Lower body dressing/undressing: 0: Wears Interior and spatial designer FIM - Camera operator Transfers: 0-Activity did not occur or was simulated (patient declined and unsafe at this time)   Refer to Care Plan for Long Term  Goals  Recommendations for other services: None  Discharge Criteria: Patient will be discharged from OT if patient refuses treatment 3 consecutive times without medical reason, if treatment goals not met, if there is a change in medical status, if patient makes no progress towards goals or if patient is discharged from hospital.  The above assessment, treatment plan, treatment alternatives and goals were discussed and mutually agreed upon: by patient  Lanice Folden 07/27/2014, 1:36 PM

## 2014-07-27 NOTE — Progress Notes (Signed)
Nursing Note: Pt sleeping in recliner w/ trach collar @ 28 % and calls when he requires suctioning via trach. Pt sounds wet and gurgly at the bedside. Pt suctioned for moderate amount of clear thin secretions. Pt again has some secretions on split gauge at trach tube.Pt suctioned x1 each time and tolerates well.wbb.

## 2014-07-27 NOTE — Evaluation (Signed)
Speech Language Pathology Assessment and Plan  Patient Details  Name: Richard [name] MRN: [id number] Date of Birth: 1961/06/15  SLP Diagnosis: Speech and Language deficits;Dysphagia;Voice disorder  Rehab Potential: Good ELOS: 10-14 days    Today's Date: 07/27/2014 SLP Individual Time: 1300-1400 SLP Individual Time Calculation (min): 60 min   Problem List:  Patient Active Problem List   Diagnosis Date Noted  . Debilitated 07/26/2014  . Acute on chronic kidney failure 07/11/2014  . Hyperkalemia 07/11/2014  . CHF (congestive heart failure) 07/11/2014  . DM (diabetes mellitus) 07/11/2014  . CAD (coronary artery disease) of artery bypass graft 07/11/2014  . HTN (hypertension) 07/11/2014  . COPD (chronic obstructive pulmonary disease) 07/11/2014  . GERD (gastroesophageal reflux disease) 07/11/2014  . Acute encephalopathy 07/11/2014  . CAP (community acquired pneumonia) 07/11/2014  . Cardiac arrest 07/11/2014   Past Medical History:  Past Medical History  Diagnosis Date  . CAD (coronary artery disease) of artery bypass graft 07/11/2014  . Cardiac arrest 07/11/2014  . CHF (congestive heart failure) 07/11/2014  . HTN (hypertension) 07/11/2014  . COPD (chronic obstructive pulmonary disease) 07/11/2014  . DM (diabetes mellitus) 07/11/2014  . GERD (gastroesophageal reflux disease) 07/11/2014  . Acute on chronic kidney failure 07/11/2014   Past Surgical History: No past surgical history on file.  Assessment / Plan / Recommendation Clinical Impression Patient is a 53 y.o. right hand male multi-medical with history of diastolic congestive heart failure, tobacco abuse, coronary artery disease with CABG, COPD, type 2 diabetes mellitus, chronic renal insufficiency with baseline creatinine 1.5-2.0. Independent with a cane due to back pain prior to admission but has a complex social situation as his children are mentally disabled and being taken care of by other individuals at this time. Admitted  from [facility] to [facility] 07/11/2014 with generalized weakness findings of hyperkalemia and altered mental status as well white blood cell count 14,000 blood pressure elevated 199/114. He was treated with Kayexalate for hyperkalemia. While undergoing echocardiogram suddenly developed VF/VT arrest. CT angiogram chest negative for pulmonary emboli. Required intubation for respiratory failure with placement of percutaneous tracheostomy 07/21/2014 per critical care medicine. Patient refused cardiac catheterization to evaluate his coronary artery disease. Plan is to downsize tracheostomy tube 07/28/2014. Subcutaneous heparin for DVT prophylaxis. Maintained on Augmentin x10 days total for findings of extensive sinus disease. Patient transferred to CIR on 07/26/2014 and administered a BSE and PMSV evaluation. Prior to evaluation, admission coordinator and NT reported patient was having difficulty "tolerating" his current diet with intermittent "choking" with Dys. 2 textures and nectar-thick liquids. Upon arrival to evaluation, patient was asleep in the recliner with PMSV in place and all vitals WFL. A PMSV evaluation was administered and patient demonstrated decreased breath support for verbal expression at the word level with a hoarse vocal quality and a low vocal intensity which impacted his overall speech intelligibility to ~50%. Patient was also administered a limited BSE due to patient declining trials of textures, suspect due to noticeable difficulty with previous meal. Patient consumed two sips of nectar-thick liquids via cup and demonstrated immediate cough with reports of "chest pain and tightness," RN made aware. Patient also started wheezing and demonstrated noticeable labored breathing. The PMSV was removed which eased patient's symptoms, however, evidence of breath stacking was noted. Suspect difficulty was due to probable aspiration, therefore, patient made NPO. Provided extensive education  to patient in regards to his current swallowing function and his high risk of aspiration due to prolonged intubation and current trach.  Patient verbalized understanding and was agreeable to FEES to assess dysphagia. Patient would benefit from skilled SLP intervention to maximize his swallowing function and functional communication in order to maximize his overall functional independence. Patient also needs a cognitive-linguistic evaluation that will be completed at a later date, however, patient's overall cognitive function appeared [facility] during functional conversation.   Skilled Therapeutic Interventions          Administered a BSE and PMSV evaluation. Please see above for details.   SLP Assessment  Patient will need skilled Speech Lanaguage Pathology Services during CIR admission    Recommendations  Patient may use Passy-Muir Speech Valve: During all therapies with supervision;Intermittently with supervision PMSV Supervision: Full MD: Please consider changing trach tube to : Cuffless;Smaller size Recommended Consults: FEES Diet Recommendations: NPO Oral Care Recommendations: Oral care BID Patient destination: Home Follow up Recommendations: Home Health SLP;24 hour supervision/assistance Equipment Recommended:  (TBD)    SLP Frequency 5 out of 7 days   SLP Treatment/Interventions Cueing hierarchy;Dysphagia/aspiration precaution training;Functional tasks;Speech/Language facilitation;Therapeutic Activities;Patient/family education    Pain Pain Assessment Pain Assessment: 0-10 Pain Score:  (unable to rate) Pain Type: Acute pain Pain Location: Chest Pain Orientation: Medial Pain Descriptors / Indicators: Aching;Tightness Pain Onset: With Activity (coughing) Patients Stated Pain Goal: 0 Pain Intervention(s): RN made aware;Repositioned (PMSV removed) Multiple Pain Sites: No  Short Term Goals: Week 1: SLP Short Term Goal 1 (Week 1): Patient will wear PMSV for 30 minutes with all vitals  remaining WFL without evidence of breath stacking with supervision multimodal cues.  SLP Short Term Goal 2 (Week 1): Patient will demonstrate increased speech intelligibility at the word level to 75% intelligible with Min A multimodal cues SLP Short Term Goal 3 (Week 1): Patient will consume recommended diet with minimal overt s/s of aspiration with Min A multimodal cues.   See FIM for current functional status Refer to Care Plan for Long Term Goals  Recommendations for other services: Neuropsych  Discharge Criteria: Patient will be discharged from SLP if patient refuses treatment 3 consecutive times without medical reason, if treatment goals not met, if there is a change in medical status, if patient makes no progress towards goals or if patient is discharged from hospital.  The above assessment, treatment plan, treatment alternatives and goals were discussed and mutually agreed upon: by patient  Gwenevere Goga 07/27/2014, 2:56 PM

## 2014-07-28 ENCOUNTER — Encounter (HOSPITAL_COMMUNITY): Payer: Medicaid Other

## 2014-07-28 ENCOUNTER — Inpatient Hospital Stay (HOSPITAL_COMMUNITY): Payer: Medicaid Other

## 2014-07-28 ENCOUNTER — Inpatient Hospital Stay (HOSPITAL_COMMUNITY): Payer: Medicaid Other | Admitting: Speech Pathology

## 2014-07-28 ENCOUNTER — Inpatient Hospital Stay (HOSPITAL_COMMUNITY): Payer: Medicaid Other | Admitting: *Deleted

## 2014-07-28 DIAGNOSIS — Z930 Tracheostomy status: Secondary | ICD-10-CM

## 2014-07-28 DIAGNOSIS — J0410 Acute tracheitis without obstruction: Secondary | ICD-10-CM

## 2014-07-28 DIAGNOSIS — R1314 Dysphagia, pharyngoesophageal phase: Secondary | ICD-10-CM

## 2014-07-28 DIAGNOSIS — I10 Essential (primary) hypertension: Secondary | ICD-10-CM

## 2014-07-28 DIAGNOSIS — J189 Pneumonia, unspecified organism: Secondary | ICD-10-CM

## 2014-07-28 DIAGNOSIS — E1165 Type 2 diabetes mellitus with hyperglycemia: Secondary | ICD-10-CM

## 2014-07-28 DIAGNOSIS — J438 Other emphysema: Secondary | ICD-10-CM

## 2014-07-28 LAB — GLUCOSE, CAPILLARY
GLUCOSE-CAPILLARY: 107 mg/dL — AB (ref 70–99)
GLUCOSE-CAPILLARY: 85 mg/dL (ref 70–99)
Glucose-Capillary: 93 mg/dL (ref 70–99)
Glucose-Capillary: 97 mg/dL (ref 70–99)
Glucose-Capillary: 94 mg/dL (ref 70–99)
Glucose-Capillary: 85 mg/dL (ref 70–99)
Glucose-Capillary: 86 mg/dL (ref 70–99)

## 2014-07-28 MED ORDER — FUROSEMIDE 20 MG PO TABS
20.0000 mg | ORAL_TABLET | Freq: Every day | ORAL | Status: DC
  Administered 2014-07-30 – 2014-08-01 (×3): 20 mg
  Filled 2014-07-28 (×5): qty 1

## 2014-07-28 MED ORDER — CHLORHEXIDINE GLUCONATE 0.12 % MT SOLN
15.0000 mL | Freq: Two times a day (BID) | OROMUCOSAL | Status: DC
  Administered 2014-07-28 – 2014-07-30 (×3): 15 mL via OROMUCOSAL
  Filled 2014-07-28 (×6): qty 15

## 2014-07-28 MED ORDER — VITAL AF 1.2 CAL PO LIQD
1000.0000 mL | ORAL | Status: DC
  Administered 2014-07-29 – 2014-08-16 (×15): 1000 mL
  Filled 2014-07-28 (×46): qty 1000

## 2014-07-28 MED ORDER — BISACODYL 10 MG RE SUPP: 10.0000 mg | Freq: Every day | RECTAL | Status: DC | PRN

## 2014-07-28 MED ORDER — AMOXICILLIN-POT CLAVULANATE 875-125 MG PO TABS
1.0000 | ORAL_TABLET | Freq: Two times a day (BID) | ORAL | Status: DC
  Administered 2014-07-29 – 2014-08-18 (×36): 1
  Filled 2014-07-28 (×46): qty 1

## 2014-07-28 MED ORDER — JEVITY 1.2 CAL PO LIQD: 1000.0000 mL | ORAL | Status: DC

## 2014-07-28 MED ORDER — ACETAMINOPHEN 325 MG PO TABS
325.0000 mg | ORAL_TABLET | ORAL | Status: DC | PRN
  Administered 2014-08-01 – 2014-08-09 (×4): 650 mg
  Filled 2014-07-28 (×4): qty 2

## 2014-07-28 MED ORDER — FREE WATER
200.0000 mL | Freq: Four times a day (QID) | Status: DC
  Administered 2014-07-29 – 2014-08-23 (×90): 200 mL

## 2014-07-28 MED ORDER — SODIUM CHLORIDE 0.9 % IV SOLN
75.0000 mL/h | INTRAVENOUS | Status: DC
  Administered 2014-07-28 – 2014-08-02 (×7): 75 mL/h via INTRAVENOUS

## 2014-07-28 MED ORDER — AMOXICILLIN-POT CLAVULANATE 875-125 MG PO TABS: 1.0000 | ORAL_TABLET | Freq: Two times a day (BID) | ORAL | Status: DC

## 2014-07-28 MED ORDER — CETYLPYRIDINIUM CHLORIDE 0.05 % MT LIQD: 7.0000 mL | Freq: Two times a day (BID) | OROMUCOSAL | Status: DC

## 2014-07-28 NOTE — Consult Note (Signed)
Name: Richard LoosenJimmie G Marchetta MRN: [id number] DOB: 03/08/1961    ADMISSION DATE:  07/26/2014 CONSULTATION DATE:  07/28/14  REFERRING MD :  Hermelinda MedicusSchwartz   CHIEF COMPLAINT:  Trach mgmt   BRIEF PATIENT DESCRIPTION: 53yo male with hx dCHF, COPD, DM, CKD [facility]14 with AMS, hyperkalemia and developed VF/VT arrest.  Course was c/b failed extubation requiring immediate re-intubation (now s/p trach), CAD for which he refused cath, volume overload, and significant dysphagia.  [facility] for further rehab efforts.  He cont to have difficulties with dysphagia, increased secretions and low grade fevers and PCCM asked to re-eval.   SIGNIFICANT EVENTS    STUDIES:  FEES 10/1>> severe dysphagia with silent aspiration    HISTORY OF PRESENT ILLNESS:  53yo male with hx dCHF, COPD, DM, CKD [facility]14 with AMS, hyperkalemia and developed VF/VT arrest.  Course was c/b failed extubation requiring immediate re-intubation (now s/p trach), CAD for which he refused cath, volume overload, and significant dysphagia.  [facility] for further rehab efforts.  He cont to have difficulties with dysphagia, increased secretions and low grade fevers and PCCM asked to re-eval.  Currently still with increased white secretions, low grade fever. No sig SOB, no chest pain.    PAST MEDICAL HISTORY :  Past Medical History  Diagnosis Date  . CAD (coronary artery disease) of artery bypass graft 07/11/2014  . Cardiac arrest 07/11/2014  . CHF (congestive heart failure) 07/11/2014  . HTN (hypertension) 07/11/2014  . COPD (chronic obstructive pulmonary disease) 07/11/2014  . DM (diabetes mellitus) 07/11/2014  . GERD (gastroesophageal reflux disease) 07/11/2014  . Acute on chronic kidney failure 07/11/2014    Prior to Admission medications   Medication Sig Start Date End Date Taking? Authorizing Provider  albuterol  (PROVENTIL HFA;VENTOLIN HFA) 108 (90 BASE) MCG/ACT inhaler Inhale 1-2 puffs into the lungs every 6 (six) hours as needed for wheezing or shortness of breath.   Yes Historical Provider, MD  Amino Acids-Protein Hydrolys (FEEDING SUPPLEMENT, PRO-STAT SUGAR FREE 64,) LIQD Take 60 mLs by mouth 2 (two) times daily at 10 am and 4 pm. 07/26/14  Yes Simonne MartinetPeter E Babcock, NP  amoxicillin-clavulanate (AUGMENTIN) 875-125 MG per tablet Take 1 tablet by mouth every 12 (twelve) hours. 07/26/14  Yes Simonne MartinetPeter E Babcock, NP  antiseptic oral rinse (CPC / CETYLPYRIDINIUM CHLORIDE 0.05%) 0.05 % LIQD solution 7 mLs by Mouth Rinse route QID. 07/26/14  Yes Simonne MartinetPeter E Babcock, NP  atorvastatin (LIPITOR) 80 MG tablet Place 1 tablet (80 mg total) into feeding tube daily at 6 PM. 07/26/14  Yes Simonne MartinetPeter E Babcock, NP  chlorhexidine (PERIDEX) 0.12 % solution 15 mLs by Mouth Rinse route 2 (two) times daily. 07/26/14  Yes Simonne MartinetPeter E Babcock, NP  furosemide (LASIX) 10 MG/ML injection Inject 2 mLs (20 mg total) into the vein daily. 07/26/14  Yes Simonne MartinetPeter E Babcock, NP  heparin 5000 UNIT/ML injection Inject 1 mL (5,000 Units total) into the skin every 8 (eight) hours. 07/26/14  Yes Simonne MartinetPeter E Babcock, NP  insulin aspart (NOVOLOG) 100 UNIT/ML injection Inject 0-20 Units into the skin every 4 (four) hours. 07/26/14  Yes Simonne MartinetPeter E Babcock, NP  insulin glargine (LANTUS) 100 UNIT/ML injection Inject 0.4 mLs (40 Units total) into the skin 2 (two) times daily. 07/26/14  Yes Simonne MartinetPeter E Babcock, NP  Maltodextrin-Xanthan Gum (RESOURCE THICKENUP CLEAR) POWD Thicken 07/26/14  Yes  Simonne Martinet, NP  metoprolol succinate (TOPROL-XL) 50 MG 24 hr tablet Take 50 mg by mouth daily. Take with or immediately following a meal.   Yes Historical Provider, MD  pantoprazole (PROTONIX) 40 MG tablet Take 1 tablet (40 mg total) by mouth daily. 07/26/14  Yes Simonne Martinet, NP  tamsulosin (FLOMAX) 0.4 MG CAPS capsule Take 0.4 mg by mouth daily.   Yes Historical Provider, MD   No Known  Allergies  FAMILY HISTORY:  family history includes Heart attack in his father; Hypertension in his sister. SOCIAL HISTORY:    REVIEW OF SYSTEMS:   As per HPI - All other systems reviewed and were neg.    SUBJECTIVE:   VITAL SIGNS: Temp:  [98.8 F (37.1 C)-100.8 F (38.2 C)] 99.5 F (37.5 C) (10/01 0622) Pulse Rate:  [83-89] 83 (10/01 1203) Resp:  [16-20] 16 (10/01 1203) BP: (120-143)/(60-69) 143/69 mmHg (10/01 0416) SpO2:  [93 %-100 %] 97 % (10/01 1203) FiO2 (%):  [28 %-35 %] 35 % (10/01 1203) Weight:  [204 lb (92.534 kg)] 204 lb (92.534 kg) (10/01 0416)  PHYSICAL EXAMINATION: General:  Chronically ill appearing male, NAD Neuro:  Alert and interactive but falls asleep easily, moving all ext to command. HEENT:  [place]/AT, PERRLA, EOM-I and MMM.  Trach site clean, increased secretion. Cardiovascular:  RRR, Nl S1/S2, -M/R/G. Lungs:  Coarse BS diffusely. Abdomen:  Soft, NT, ND and +BS. Musculoskeletal:  -edema and -tenderness. Skin:  Intact.   Recent Labs Lab 07/23/14 0340 07/26/14 0428 07/26/14 1948 07/27/14 0755  NA 151* 141  --  141  K 3.9 3.6*  --  3.6*  CL 106 101  --  100  CO2 27 28  --  28  BUN 42* 27*  --  25*  CREATININE 1.05 0.87 0.84 0.83  GLUCOSE 97 183*  --  145*    Recent Labs Lab 07/26/14 0428 07/26/14 1948 07/27/14 0755  HGB 12.4* 12.1* 12.4*  HCT 38.4* 38.1* 39.3  WBC 9.4 12.2* 8.7  PLT 427* 370 366   No results found.  ASSESSMENT / PLAN:  Chronic trach dependent resp failure - post arrest.  C/b COPD, volume overload, deconditioning, pulm HTN.  Failed extubation 9/23 with stridor requiring immediate reintubation. Now tol ATC at all times, #6 cuffed shiley.  REC -  Cont BD  Hold off on change to cuffless trach for now with increased secretions and ?aspiration  cont PMV as tol  CXR - pending  pulm hygiene  Smoking cessation  Keep dry as tol  Cont abx per primary for extensive sinus disease (Augmentin) - may need to broaden if ?HCAP  based on CXR (pending) Also recommend keeping more on the dry side to control secretion.  Chronic diastolic CHF  REC -  Lasix per primary  F/u chem  Fu BNP    Dirk Dress, NP 07/28/2014  1:58 PM Pager: [phone] or [phone]  *Care during the described time interval was provided by me and/or other providers on the critical care team. I have reviewed this patient's available data, including medical history, events of note, physical examination and test results as part of my evaluation.  Alyson Reedy, M.D. [facility] Pulmonary/Critical Care Medicine. Pager: [phone]. After hours pager: [phone].

## 2014-07-28 NOTE — Progress Notes (Signed)
Occupational Therapy Session Note  Patient Details  Name: Richard [name] MRN: [id number] Date of Birth: 04-28-1961  Today's Date: 07/28/2014 OT Individual Time: 0700-0800 OT Individual Time Calculation (min): 60 min    Short Term Goals: Week 1:  OT Short Term Goal 1 (Week 1): LB Dressing: Min assist to include sit and stand OT Short Term Goal 2 (Week 1): Toileting: Supervision for 3/3 toilet tasks to include sit and stand OT Short Term Goal 3 (Week 1): Toilet transfer: steady assist with LRAD and standard toilet height OT Short Term Goal 4 (Week 1): Groom:  Stand at sink for grooming tasks OT Short Term Goal 5 (Week 1): Simple HM tasks:  Min assist with LRAD to gather clothes prior to B/Dsg session and/or assist with clean up after B/Dsg  Skilled Therapeutic Interventions/Progress Updates:    Pt seated in recliner upon arrival.  Pt indicated he needed to use BSC and performed stand pivot transfers with steady A.  Pt transferred back to recliner and completed bathing tasks with sit<>stand from recliner.  Pt does not currently have any clothes and is wearing hospital gowns. O2 sats >93% throughout session.  Pt tolerated PMV throughout session.  RN performed suction X 1 during session.  Pt required multiple rest breaks throughout session.  Focus on activity tolerance, transfers, sit<>stand, standing balance, and safety awareness.  Therapy Documentation Precautions:  Precautions Precautions: Fall Precaution Comments: h/o falls PTA per chart, trach and O2 Restrictions Weight Bearing Restrictions: No Pain: Pain Assessment Pain Assessment: No/denies pain  See FIM for current functional status  Therapy/Group: Individual Therapy  [name], Licia Harl Chappell 07/28/2014, 8:05 AM

## 2014-07-28 NOTE — Progress Notes (Signed)
Speech Language Pathology Daily Session Note  Patient Details  Name: Richard LoosenJimmie G [name] MRN: [id number] Date of Birth: [birth year]  Today's Date: 07/28/2014 SLP Individual Time: 0900-0930 SLP Individual Time Calculation (min): 30 min  Short Term Goals: Week 1: SLP Short Term Goal 1 (Week 1): Patient will wear PMSV for 30 minutes with all vitals remaining WFL without evidence of breath stacking with supervision multimodal cues.  SLP Short Term Goal 2 (Week 1): Patient will demonstrate increased speech intelligibility at the word level to 75% intelligible with Min A multimodal cues SLP Short Term Goal 3 (Week 1): Pt will complete pharyngeal strengthening exercise to maximize swallowing function with Mod cues  Skilled Therapeutic Interventions: Skilled treatment session focused on cognitive-linguistic goals. Upon arrival, patient was asleep while sitting upright in his recliner. SLP facilitated session by donning PMSV. Patient tolerated the valve throughout the initial 30 minutes and during his FEES procedure with all vitals remaining WFL without evidence of breath stacking.  Patient participated in a functional conversation but was ~50% at the phrase level due to decreased breath support and vocal intensity and required Mod A multimodal cues to self-monitor.  Continue with current plan of care.    FIM:  Comprehension Comprehension Mode: Auditory Comprehension: 4-Understands basic 75 - 89% of the time/requires cueing 10 - 24% of the time Expression Expression Mode: Verbal Expression: 2-Expresses basic 25 - 49% of the time/requires cueing 50 - 75% of the time. Uses single words/gestures. Social Interaction Social Interaction: 3-Interacts appropriately 50 - 74% of the time - May be physically or verbally inappropriate. Problem Solving Problem Solving: 4-Solves basic 75 - 89% of the time/requires cueing 10 - 24% of the time Memory Memory: 4-Recognizes or recalls 75 - 89% of the time/requires  cueing 10 - 24% of the time FIM - Eating Eating Activity: 5: Needs verbal cues/supervision  Pain Pain Assessment Pain Assessment: No/denies pain Pain Score: 0-No pain  Therapy/Group: Individual Therapy  Naliyah Neth 07/28/2014, 12:16 PM

## 2014-07-28 NOTE — Progress Notes (Signed)
[place] PHYSICAL MEDICINE & REHABILITATION     PROGRESS NOTE    Subjective/Complaints: Still having difficulties with secretions.  A  review of systems has been performed and if not noted above is otherwise negative.   Objective: Vital Signs: Blood pressure 143/69, pulse 86, temperature 99.5 F (37.5 C), temperature source Oral, resp. rate 17, height 5\' 2"  (1.575 m), weight 92.534 kg (204 lb), SpO2 100.00%. No results found.  Recent Labs  07/26/14 1948 07/27/14 0755  WBC 12.2* 8.7  HGB 12.1* 12.4*  HCT 38.1* 39.3  PLT 370 366    Recent Labs  07/26/14 0428 07/26/14 1948 07/27/14 0755  NA 141  --  141  K 3.6*  --  3.6*  CL 101  --  100  GLUCOSE 183*  --  145*  BUN 27*  --  25*  CREATININE 0.87 0.84 0.83  CALCIUM 8.5  --  9.0   CBG (last 3)   Recent Labs  07/27/14 2358 07/28/14 0412 07/28/14 0833  GLUCAP 122* 107* 93    Wt Readings from Last 3 Encounters:  07/28/14 92.534 kg (204 lb)  07/26/14 93 kg (205 lb 0.4 oz)  07/26/14 93 kg (205 lb 0.4 oz)    Physical Exam:  Constitutional:  No acute distress.  HENT: orlal mucosa pink/moist  Head: Normocephalic.  Eyes: EOM are normal.  Neck:  #6 cuffed trach in place---cuff deflated--secretions dripping from trach.   Cardiovascular:  Cardiac rate controlled. No murmurs. Reg rhythm  Respiratory:  Scattered rhonchi and wet breath sounds, +thin, secretions, a few wheezes, occasional respiratory distress  GI: Soft. Bowel sounds are normal. He exhibits no distension.  Neurological: He is alert.  Patient is able to provide his name, age and date of birth. Follows simple commands. CN exam grossly intact. UE: 3+ delt, 4/5 bicep/tricep/HI. LE's: 3/5 HF, 3+KE, 4- ankles. No gross sensory changes. Good sitting balance.  Psychiatric:  Flat, cooperative  Skin: a few scattered echhymoses on limbs.---   Assessment/Plan: 1. Functional deficits secondary to severe deconditioning after respiratory failure which  require 3+ hours per day of interdisciplinary therapy in a comprehensive inpatient rehab setting. Physiatrist is providing close team supervision and 24 hour management of active medical problems listed below. Physiatrist and rehab team continue to assess barriers to discharge/monitor patient progress toward functional and medical goals. FIM: FIM - Bathing Bathing Steps Patient Completed: Chest;Right Arm;Left Arm;Abdomen;Front perineal area;Buttocks;Right upper leg;Left upper leg Bathing: 4: Min-Patient completes 8-9 [id number] 10 parts or 75+ percent  FIM - Upper Body Dressing/Undressing Upper body dressing/undressing: 0: Wears gown/pajamas-no public clothing FIM - Lower Body Dressing/Undressing Lower body dressing/undressing: 0: Wears gown/pajamas-no public clothing  FIM - Toileting Toileting steps completed by patient: Performs perineal hygiene Toileting: 2: Max-Patient completed 1 of 3 steps  FIM - Diplomatic Services operational officerToilet Transfers Toilet Transfers Assistive Devices: PsychiatristBedside commode Toilet Transfers: 4-To toilet/BSC: Min A (steadying Pt. > 75%);4-From toilet/BSC: Min A (steadying Pt. > 75%)  FIM - Bed/Chair Transfer Bed/Chair Transfer Assistive Devices:  (Pt refused due to pain in chest) Bed/Chair Transfer: 0: Activity did not occur  FIM - Locomotion: Wheelchair Distance: 100 Locomotion: Wheelchair: 2: Travels 50 - 149 ft with supervision, cueing or coaxing FIM - Locomotion: Ambulation Locomotion: Ambulation Assistive Devices: Designer, industrial/productWalker - Rolling Ambulation/Gait Assistance: 4: Min guard;4: Min assist Locomotion: Ambulation: 1: Travels less than 50 ft with minimal assistance (Pt.>75%)  Comprehension Comprehension Mode: Auditory Comprehension: 4-Understands basic 75 - 89% of the time/requires cueing 10 - 24% of the time  Expression Expression Mode: Verbal Expression: 2-Expresses basic 25 - 49% of the time/requires cueing 50 - 75% of the time. Uses single words/gestures.  Social Interaction Social  Interaction: 3-Interacts appropriately 50 - 74% of the time - May be physically or verbally inappropriate.  Problem Solving Problem Solving: 4-Solves basic 75 - 89% of the time/requires cueing 10 - 24% of the time  Memory Memory: 4-Recognizes or recalls 75 - 89% of the time/requires cueing 10 - 24% of the time  Medical Problem List and Plan:  1. Functional deficits secondary to debilitation after cardiac arrest/respiratory failure.  2. 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 Plan to downsize to a #6 cuffless versus #4 trach 07/28/2014   -oob  -IS/FV 2. DVT Prophylaxis/Anticoagulation: Subcutaneous heparin. Monitor platelet counts and any signs of bleeding  3. Pain Management: Tylenol as needed  4.  Dysphagia: spoke with slp regarding swallow---dipslayed severe pharyngeal dysphagia due to sensorimotor dysfunction---NPO recommended  -drop panda tube today and change meds to per tube 5. Neuropsych: This patient is capable of making decisions on his own behalf.  6. Skin/Wound Care: Routine skin care. Trach care as directed  7. Fluids/Electrolytes/Nutrition:  Following weights and I's and o's  8. CAD/CABG. Patient has refused cardiac catheterization in the past  9. Diastolic congestive heart failure. Continue Lasix as directed. Monitor for any signs of fluid overload  10. Diabetes mellitus with peripheral neuropathy. Lantus insulin 40 units twice a day. Check blood sugars a.c. and at bedtime   -adjusted to 50u qam and 40 u qpm  - 11. Tobacco abuse. Provide counseling  12. Chronic renal insufficiency. Baseline creatinine 1.5 did 2.0.   13. Extensive sinus disease left mastoid. Continue Augmentin thru weekend  -wbc's still elevated 12.2      LOS (Days) 2 A FACE TO FACE EVALUATION WAS PERFORMED  Aamya Orellana T 07/28/2014 10:44 AM

## 2014-07-28 NOTE — Progress Notes (Signed)
INITIAL NUTRITION ASSESSMENT  DOCUMENTATION CODES Per approved criteria  -Morbid Obesity   INTERVENTION: Initiate Vital AF 1.2 @ 35 ml/hr via feeding tube and increase by 10 ml every 4 hours to goal rate of 85 ml/hr.   Free water 200 ml every 6 hours  Tube feeding regimen provides 2040 kcal, 127 grams of protein, and 1378 ml of H2O. Total free water: 2178 ml  NUTRITION DIAGNOSIS: Inadequate oral intake related to inability to eat as evidenced by NPO status  Goal: Pt to meet >/= 90% of their estimated nutrition needs   Monitor:  TF initiation and tolerance, weight trends, labs  Reason for Assessment: Consult received to initiate and manage enteral nutrition support.  53 y.o. male  Admitting Dx: Weakness  ASSESSMENT: [facility] and A[facility]. Pt has cardiac arrest while undergoing echocardiogram. Pt intubated and had difficulty with extubation. Pt had trach placed 9/24.  Pt failed swallow evaluation 10/1 and now plans to have feeding tube placed and to start enteral nutrition.   No signs of fat or muscle depletion.   Height: Ht Readings from Last 1 Encounters:  07/26/14 5\' 2"  (1.575 m)    Weight: Wt Readings from Last 1 Encounters:  07/28/14 204 lb (92.534 kg)    Ideal Body Weight: 56.3 kg   % Ideal Body Weight: 164%  Wt Readings from Last 10 Encounters:  07/28/14 204 lb (92.534 kg)  07/26/14 205 lb 0.4 oz (93 kg)  07/26/14 205 lb 0.4 oz (93 kg)    Usual Body Weight: unknown  % Usual Body Weight: -  BMI:  Body mass index is 37.3 kg/(m^2).  Estimated Nutritional Needs: Kcal: 2000-2200 Protein: 110-125 grams Fluid: > 2 L/day  Skin: ecchymosis  Diet Order: NPO  EDUCATION NEEDS: -No education needs identified at this time   Intake/Output Summary (Last 24 hours) at 07/28/14 1549 Last data filed at 07/27/14 1900  Gross per 24 hour  Intake      0 ml  Output      0 ml  Net      0 ml    Last BM: 10/1    Labs:   Recent Labs Lab 07/23/14 0340 07/26/14 0428 07/26/14 1948 07/27/14 0755  NA 151* 141  --  141  K 3.9 3.6*  --  3.6*  CL 106 101  --  100  CO2 27 28  --  28  BUN 42* 27*  --  25*  CREATININE 1.05 0.87 0.84 0.83  CALCIUM 9.6 8.5  --  9.0  GLUCOSE 97 183*  --  145*    CBG (last 3)   Recent Labs  07/28/14 0412 07/28/14 0833 07/28/14 1151  GLUCAP 107* 93 97    Scheduled Meds: . amoxicillin-clavulanate  1 tablet Per Tube Q12H  . antiseptic oral rinse  7 mL Mouth Rinse QID  . atorvastatin  80 mg Per Tube q1800  . chlorhexidine  15 mL Mouth Rinse BID  . [START ON 07/29/2014] furosemide  20 mg Per Tube Daily  . heparin  5,000 Units Subcutaneous 3 times per day  . insulin aspart  0-20 Units Subcutaneous 6 times per day  . insulin glargine  40 Units Subcutaneous QHS  . insulin glargine  50 Units Subcutaneous Daily  . pantoprazole  40 mg Oral Daily    Continuous Infusions:   Past Medical History  Diagnosis Date  . CAD (coronary artery disease) of artery bypass graft 07/11/2014  . Cardiac arrest  07/11/2014  . CHF (congestive heart failure) 07/11/2014  . HTN (hypertension) 07/11/2014  . COPD (chronic obstructive pulmonary disease) 07/11/2014  . DM (diabetes mellitus) 07/11/2014  . GERD (gastroesophageal reflux disease) 07/11/2014  . Acute on chronic kidney failure 07/11/2014    No past surgical history on file.  Kendell Bane RD, LDN, CNSC [phone] Pager [phone] After Hours Pager

## 2014-07-28 NOTE — Progress Notes (Signed)
Physical Therapy Note  Patient Details  Name: Richard [name] MRN: [id number] Date of Birth: 1961/07/01 Today's Date: 07/28/2014    Patient missed 60 minutes of skilled physical therapy secondary to off unit for chest x-ray. Will follow up as able.   Zella RicherBridget S Dawsen Krieger S. Erik Nessel, PT, DPT 07/28/2014, 1:34 PM

## 2014-07-28 NOTE — Progress Notes (Signed)
Nursing Note: Paged on-call as pt NPO and has no tube at this time. Panda tube to be placed tomm. In radiology.Obtained order to hold Lantus for tonight as cbg is 86.Pt has has only breakfast and has been NPO since.wbb

## 2014-07-28 NOTE — Progress Notes (Signed)
Given Mr. Richard [name]'s FEES results, I will go ahead and leave the cuffed #6 trach in for now. Have asked that pulm/ccs come by and take a look at him also.  CXR pendin. Panda being placed now.  Ranelle OysterZachary T. Swartz, MD, [facility] CenterFAAPMR [facility] 07/28/2014

## 2014-07-28 NOTE — IPOC Note (Signed)
Overall Plan of Care [facility](IPOC) Patient Details Name: Cyril LoosenJimmie G Trella MRN: [id number] DOB: April 06, 1961  Admitting Diagnosis: Deconditioned after cardiac arrest  resp failure   Hospital Problems: Active Problems:   Debilitated   Tracheitis   Tracheostomy status     Functional Problem List: Nursing Endurance;Nutrition;Skin Integrity;Other (comment);Medication Management;Motor;Sensory;Safety  PT Balance;Endurance;Motor;Safety  OT Balance;Edema;Endurance;Safety;Skin Integrity;Pain (h/o back pain)  SLP    TR         Basic ADL's: OT Grooming;Bathing;Dressing;Toileting     Advanced  ADL's: OT Light Housekeeping     Transfers: PT Bed Mobility;Bed to Chair;Car;Furniture  OT Toilet;Tub/Shower     Locomotion: PT Ambulation;Wheelchair Mobility;Stairs     Additional Impairments: OT    SLP Swallowing;Communication expression    TR      Anticipated Outcomes Item Anticipated Outcome  Self Feeding    Swallowing  Supervision with least restrictive diet   Basic self-care  Supervision  Toileting  Supervision   Bathroom Transfers Supervision  Bowel/Bladder  pt manages both by d/c  Transfers  mod I  Locomotion  mod I, S for stairs and car transfer  Communication  Supervision   Cognition     Pain  <2 on a 0-10 scale  Safety/Judgment  demonstrates safe behaviors and safe mobility   Therapy Plan: PT Intensity: Minimum of 1-2 x/day ,45 to 90 minutes PT Frequency: 5 out of 7 days PT Duration Estimated Length of Stay: 14 days OT Intensity: Minimum of 1-2 x/day, 45 to 90 minutes OT Frequency: 5 out of 7 days OT Duration/Estimated Length of Stay: 10-14 days SLP Intensity: Minumum of 1-2 x/day, 30 to 90 minutes SLP Frequency: 5 out of 7 days SLP Duration/Estimated Length of Stay: 10-14 days       Team Interventions: Nursing Interventions Patient/Family Education;Medication Management;Skin Care/Wound Management;Dysphagia/Aspiration Precaution Training;Discharge  Planning;Disease Management/Prevention;Psychosocial Support  PT interventions Ambulation/gait training;Balance/vestibular training;Discharge planning;Disease Lexicographermanagement/prevention;DME/adaptive equipment instruction;Functional mobility training;Pain management;Patient/family education;Stair training;Therapeutic Activities;Therapeutic Exercise;UE/LE Strength taining/ROM;Wheelchair propulsion/positioning  OT Interventions Social workerBalance/vestibular training;DME/adaptive equipment instruction;Discharge planning;Disease mangement/prevention;Functional mobility training;Patient/family education;Self Care/advanced ADL retraining;Therapeutic Activities;Therapeutic Exercise;UE/LE Strength taining/ROM;UE/LE Coordination activities  SLP Interventions Cueing hierarchy;Dysphagia/aspiration precaution training;Functional tasks;Speech/Language facilitation;Therapeutic Activities;Patient/family education  TR Interventions    SW/CM Interventions Discharge Planning;Psychosocial Support;Patient/Family Education    Team Discharge Planning: Destination: PT-Home ,OT- Home , SLP-Home Projected Follow-up: PT-Home health PT, OT-  Home health OT;24 hour supervision/assistance, SLP-Home Health SLP;24 hour supervision/assistance Projected Equipment Needs: PT-To be determined, OT- None recommended by OT, SLP- (TBD) Equipment Details: PT- , OT-patient reports that he has a shower chair.  BSC TBA Patient/family involved in discharge planning: PT- Patient,  OT-Patient, SLP-Patient  MD ELOS: 12-14 days Medical Rehab Prognosis:  Excellent Assessment: The patient has been admitted for CIR therapies with the diagnosis of deconditioning after sepsis/chf. The team will be addressing functional mobility, strength, stamina, balance, safety, adaptive techniques and equipment, self-care, bowel and bladder mgt, patient and caregiver education, breathing techniques, swallowing, communication, leisure awareness, community reintegration. Goals have  been set at supervision for basic self-care and mod I for basic transfers and mobility, supervision for communication. Course and participation potentially to be affected by respiratory issues.Ranelle Oyster.    Zachary T. Swartz, MD, FAAPMR      See Team Conference Notes for weekly updates to the plan of care

## 2014-07-28 NOTE — Procedures (Addendum)
Objective Swallowing Evaluation: Fiberoptic Endoscopic Evaluation of Swallowing  Patient Details  Name: Richard [name] MRN: [id number] Date of Birth: 01/04/1961  Today's Date: 07/28/2014 Time:  0930-1001 Time Calculation (min): 31  Past Medical History:  Past Medical History  Diagnosis Date  . CAD (coronary artery disease) of artery bypass graft 07/11/2014  . Cardiac arrest 07/11/2014  . CHF (congestive heart failure) 07/11/2014  . HTN (hypertension) 07/11/2014  . COPD (chronic obstructive pulmonary disease) 07/11/2014  . DM (diabetes mellitus) 07/11/2014  . GERD (gastroesophageal reflux disease) 07/11/2014  . Acute on chronic kidney failure 07/11/2014   Past Surgical History: No past surgical history on file. HPI:  53 y.o. male transferred from [facility] 07/11/14 (presented to hospital with hyperkalemia and altered mental status). 07/11/14 cardiac arrest and intubated; extubated and reintubated 07/15/14.  Trach 9/24. Acute on chronic renal failure.  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 CABG), GERD, COPD, diabetes, chronic kidney disease      Recommendation/Prognosis  Clinical Impression:   Dysphagia Diagnosis: Severe pharyngeal phase dysphagia;Severe cervical esophageal phase dysphagia  Clinical impression: Pt has a severe sensorimotor pharyngeal dysphagia due to generalized weakness, decreased sensation, and edema. Pt consumed small amounts of PO with adequate timing for swallow initiation, with PMSV in place throughout the study. Unfortunately, decreased hyolaryngeal movement and pharyngeal narrowing from edema result in limited epiglottic deflection with inadequate airway closure. Diffuse residuals remained throughout the pharynx and laryngeal vestibule, which patient had difficulty clearing despite cueing from SLP for various compensatory strategies and maneuvers. Pt had silent aspiration and deep, silent penetration both during the swallow and after, due  to the severe residuals that he was unable to clear. RN made aware in order to perform deep suctioning upon completion of the study. Recommend that patient remain NPO at this time due to severe aspiration risk with any PO. Pt will benefit from pharyngeal strengthening exercises to increase strength and hyolaryngeal movement, and will likely require repeat objective testing prior to initiation of PO diet.   Swallow Evaluation Recommendations:  Recommended Consults: FEES Diet Recommendations: NPO Medication Administration: Via alternative means Oral Care Recommendations: Oral care Q4 per protocol Follow up Recommendations: Home health SLP    Prognosis:  Prognosis for Safe Diet Advancement: Good   Individuals Consulted: Consulted and Agree with Results and Recommendations: Patient;MD;RN;Other (Comment) (treating SLP)      SLP Assessment/Plan  Plan:  Potential to Achieve Goals: Good Potential Considerations: Severity of impairments   Short Term Goals: Week 1: SLP Short Term Goal 1 (Week 1): Patient will wear PMSV for 30 minutes with all vitals remaining WFL without evidence of breath stacking with supervision multimodal cues.  SLP Short Term Goal 2 (Week 1): Patient will demonstrate increased speech intelligibility at the word level to 75% intelligible with Min A multimodal cues SLP Short Term Goal 3 (Week 1): Pt will complete pharyngeal strengthening exercise to maximize swallowing function with Mod cues    General: Date of Onset: 07/11/14 Type of Study: Fiberoptic Endoscopic Evaluation of Swallowing Reason for Referral: Objectively evaluate swallowing function Previous Swallow Assessment: BSE on 9/26 and recommended Dys. 2 textures with nectar-thick liquids; repeat BSE 9/30 recommending NPO pending FEES Diet Prior to this Study: NPO Temperature Spikes Noted: Yes Respiratory Status: Trach collar Trach Size and Type: Cuff;#6;Deflated;With PMSV in place History of Recent Intubation:  Yes Length of Intubations (days): 10 days Date extubated: 07/21/14 Behavior/Cognition: Alert;Cooperative Oral Cavity - Dentition: Dentures, top;Dentures, bottom Self-Feeding Abilities: Able to feed self  Patient Positioning: Upright in chair Baseline Vocal Quality: Hoarse;Low vocal intensity Volitional Cough: Weak Volitional Swallow: Able to elicit Anatomy: Other (Comment) (appears swollen, hard to visualize TVF) Pharyngeal Secretions: Standing secretions in (comment) (in vestibule, near vocal folds)   Reason for Referral:   Objectively evaluate swallowing function    Oral Phase: Oral Preparation/Oral Phase Oral Phase: WFL   Pharyngeal Phase:  Pharyngeal Phase Pharyngeal Phase: Impaired Pharyngeal - Nectar Pharyngeal - Nectar Cup: Reduced pharyngeal peristalsis;Reduced epiglottic inversion;Reduced anterior laryngeal mobility;Reduced laryngeal elevation;Reduced airway/laryngeal closure;Reduced tongue base retraction;Penetration/Aspiration during swallow;Penetration/Aspiration after swallow;Pharyngeal residue - valleculae;Pharyngeal residue - pyriform sinuses;Pharyngeal residue - posterior pharnyx;Pharyngeal residue - cp segment;Inter-arytenoid space residue;Lateral channel residue;Compensatory strategies attempted (Comment) (multiple swallows, cough not effective to clear) Penetration/Aspiration details (nectar cup): Material enters airway, passes BELOW cords without attempt by patient to eject out (silent aspiration) Pharyngeal - Thin Pharyngeal - Ice Chips: Reduced pharyngeal peristalsis;Reduced epiglottic inversion;Reduced anterior laryngeal mobility;Reduced laryngeal elevation;Reduced airway/laryngeal closure;Reduced tongue base retraction;Penetration/Aspiration during swallow;Penetration/Aspiration after swallow;Lateral channel residue;Pharyngeal residue - pyriform sinuses;Pharyngeal residue - valleculae;Compensatory strategies attempted (Comment) (cough, re-swallow not effective to  clear) Penetration/Aspiration details (ice chips): Material enters airway, passes BELOW cords without attempt by patient to eject out (silent aspiration) Pharyngeal - Solids Pharyngeal - Puree: Reduced pharyngeal peristalsis;Reduced epiglottic inversion;Reduced anterior laryngeal mobility;Reduced laryngeal elevation;Reduced airway/laryngeal closure;Reduced tongue base retraction;Penetration/Aspiration after swallow;Pharyngeal residue - valleculae;Pharyngeal residue - pyriform sinuses;Pharyngeal residue - cp segment;Lateral channel residue;Compensatory strategies attempted (Comment) (chin tuck, head turn right not effective) Penetration/Aspiration details (puree): Material enters airway, CONTACTS cords and not ejected out   Cervical Esophageal Phase  Cervical Esophageal Phase Cervical Esophageal Phase: Impaired Cervical Esophageal Phase - Nectar Nectar Cup: Reduced cricopharyngeal relaxation Cervical Esophageal Phase - Solids Puree: Reduced cricopharyngeal relaxation Cervical Esophageal Phase - Comment Cervical Esophageal Comment: reduced relaxation with ice chips as well   GN         Maxcine HamLaura Paiewonsky, M.A. CCC-SLP [phone]  Maxcine Hamaiewonsky, Coti Burd 07/28/2014, 10:41 AM

## 2014-07-28 NOTE — Progress Notes (Signed)
Patient with episode of increasing anxiety at during this shift at 2350.  O2 sats at 98%.  Skin warm and dry to touch.  No cyanosis.  Respirations nonlabored.  New order received for Ativan 0.25mg  IM or PO.  Ativan administered IM as ordered with relief.  Patient has rested on and off during the night.  Had large, soft BM and expressed relief of constipation symptoms.  Prefers to sit in recliner chair at HS.  Will continue to monitor for changes in condition.  Kelli HopeBarber, Deanna Boehlke M

## 2014-07-29 ENCOUNTER — Inpatient Hospital Stay (HOSPITAL_COMMUNITY): Payer: Medicaid Other

## 2014-07-29 ENCOUNTER — Encounter (HOSPITAL_COMMUNITY): Payer: Medicaid Other

## 2014-07-29 ENCOUNTER — Inpatient Hospital Stay (HOSPITAL_COMMUNITY): Payer: Medicaid Other | Admitting: Rehabilitation

## 2014-07-29 ENCOUNTER — Inpatient Hospital Stay (HOSPITAL_COMMUNITY): Payer: Medicaid Other | Admitting: Speech Pathology

## 2014-07-29 DIAGNOSIS — J449 Chronic obstructive pulmonary disease, unspecified: Secondary | ICD-10-CM

## 2014-07-29 DIAGNOSIS — N189 Chronic kidney disease, unspecified: Secondary | ICD-10-CM

## 2014-07-29 DIAGNOSIS — N179 Acute kidney failure, unspecified: Secondary | ICD-10-CM

## 2014-07-29 LAB — GLUCOSE, CAPILLARY
GLUCOSE-CAPILLARY: 80 mg/dL (ref 70–99)
GLUCOSE-CAPILLARY: 79 mg/dL (ref 70–99)
Glucose-Capillary: 80 mg/dL (ref 70–99)
Glucose-Capillary: 136 mg/dL — ABNORMAL HIGH (ref 70–99)
Glucose-Capillary: 68 mg/dL — ABNORMAL LOW (ref 70–99)
Glucose-Capillary: 86 mg/dL (ref 70–99)

## 2014-07-29 LAB — CBC
HCT: 38.6 % — ABNORMAL LOW (ref 39.0–52.0)
Hemoglobin: 12 g/dL — ABNORMAL LOW (ref 13.0–17.0)
MCH: 28.2 pg (ref 26.0–34.0)
MCHC: 31.1 g/dL (ref 30.0–36.0)
MCV: 90.6 fL (ref 78.0–100.0)
PLATELETS: ADEQUATE 10*3/uL (ref 150–400)
RBC: 4.26 MIL/uL (ref 4.22–5.81)
RDW: 16.3 % — ABNORMAL HIGH (ref 11.5–15.5)
WBC: 9.6 10*3/uL (ref 4.0–10.5)

## 2014-07-29 LAB — BASIC METABOLIC PANEL
Anion gap: 12 (ref 5–15)
BUN: 17 mg/dL (ref 6–23)
CO2: 28 meq/L (ref 19–32)
CREATININE: 0.72 mg/dL (ref 0.50–1.35)
Calcium: 8.9 mg/dL (ref 8.4–10.5)
Chloride: 102 mEq/L (ref 96–112)
GFR calc Af Amer: >90 mL/min (ref >90)
GFR calc non Af Amer: >90 mL/min (ref >90)
Glucose, Bld: 79 mg/dL (ref 70–99)
Potassium: 4.4 mEq/L (ref 3.7–5.3)
Sodium: 142 mEq/L (ref 137–147)

## 2014-07-29 LAB — PRO B NATRIURETIC PEPTIDE: Pro B Natriuretic peptide (BNP): 285.6 pg/mL — ABNORMAL HIGH (ref 0–125)

## 2014-07-29 MED ORDER — DEXTROSE 50 % IV SOLN
INTRAVENOUS | Status: AC
  Administered 2014-07-29: 25 mL via INTRAVENOUS
  Filled 2014-07-29: qty 50

## 2014-07-29 MED ORDER — IOHEXOL 300 MG/ML  SOLN
50.0000 mL | Freq: Once | INTRAMUSCULAR | Status: AC | PRN
  Administered 2014-07-29: 20 mL via INTRAVENOUS

## 2014-07-29 MED ORDER — DEXTROSE 50 % IV SOLN
25.0000 mL | Freq: Once | INTRAVENOUS | Status: AC | PRN
  Administered 2014-07-29: 25 mL via INTRAVENOUS

## 2014-07-29 NOTE — Significant Event (Signed)
Hypoglycemic Event  CBG: 68  Treatment: D50 IV 25 mL  Symptoms: None  Follow-up CBG: Time:1309 CBG Result:136  Possible Reasons for Event: Other: Patient is NPO, receiving NS IV fluids at 4575ml/hr.  Comments/MD notified:Pam Love, PA-C notified. Patient resting comfortable in recliner, will continue to monitor.    Kennyth ArnoldJENNINGS, Khair Chasteen G  Remember to initiate Hypoglycemia Order Set & complete

## 2014-07-29 NOTE — Progress Notes (Signed)
[place] PHYSICAL MEDICINE & REHABILITATION     PROGRESS NOTE    Subjective/Complaints: Couldn'[name] pass panda--went into lung. Still with a lot of secretions.   A  review of systems has been performed and if not noted above is otherwise negative.   Objective: Vital Signs: Blood pressure 123/55, pulse 73, temperature 98.7 F (37.1 C), temperature source Oral, resp. rate 18, height 5\' 2"  (1.575 m), weight 92.534 kg (204 lb), SpO2 100.00%. Dg Chest 2 View  07/28/2014   CLINICAL DATA:  Persistent cough.  EXAM: CHEST  2 VIEW  COMPARISON:  July 23, 2014.  FINDINGS: Stable cardiomediastinal silhouette. Tracheostomy tube is unchanged in position. Sternotomy wires are noted. No pneumothorax or pleural effusion is noted. No acute pulmonary disease is noted. Bony thorax is intact.  IMPRESSION: No acute cardiopulmonary abnormality seen.   Electronically Signed   By: Roque Lias M.D.   On: 07/28/2014 14:04   Dg Abd Portable 1v  07/28/2014   CLINICAL DATA:  Pain tube placement.  EXAM: PORTABLE ABDOMEN - 1 VIEW  COMPARISON:  07/20/2014.  FINDINGS: Double tube noted projected over the right chest, most likely in right mainstem bronchus. Repositioning suggested. Prior median sternotomy. No bowel distention.  IMPRESSION: Dobbhoff tube noted projected over the right chest, most likely in right mainstem bronchus. Repositioning suggested. These results were called by telephone at the time of interpretation on 07/28/2014 at 4:17 pm to nurse Chales Abrahams , who verbally acknowledged these results.   Electronically Signed   By: Maisie Fus  Register   On: 07/28/2014 16:20    Recent Labs  07/27/14 0755 07/29/14 0520  WBC 8.7 9.6  HGB 12.4* 12.0*  HCT 39.3 38.6*  PLT 366 PLATELET CLUMPS NOTED ON SMEAR, COUNT APPEARS ADEQUATE    Recent Labs  07/27/14 0755 07/29/14 0520  NA 141 142  K 3.6* 4.4  CL 100 102  GLUCOSE 145* 79  BUN 25* 17  CREATININE 0.83 0.72  CALCIUM 9.0 8.9   CBG (last 3)   Recent Labs   07/28/14 2337 07/29/14 0401 07/29/14 0900  GLUCAP 85 80 80    Wt Readings from Last 3 Encounters:  07/28/14 92.534 kg (204 lb)  07/26/14 93 kg (205 lb 0.4 oz)  07/26/14 93 kg (205 lb 0.4 oz)    Physical Exam:  Constitutional:  No acute distress.  HENT: orlal mucosa pink/moist  Head: Normocephalic.  Eyes: EOM are normal.  Neck:  #6 cuffed trach in place---cuff deflated--yellow, thick secretions around and through trach.   Cardiovascular:  Cardiac rate controlled. No murmurs. Reg rhythm  Respiratory:  Scattered rhonchi and wet breath sounds, +  secretions, a few wheezes, occasional respiratory distress  GI: Soft. Bowel sounds are normal. He exhibits no distension.  Neurological: He is alert.  Patient is able to provide his name, age and date of birth. Follows simple commands. CN exam grossly intact. UE: 3+ delt, 4/5 bicep/tricep/HI. LE's: 3/5 HF, 3+KE, 4- ankles. No gross sensory changes. Good sitting balance.  Psychiatric:  Flat, frustrated Skin: a few scattered echhymoses on limbs.---   Assessment/Plan: 1. Functional deficits secondary to severe deconditioning after respiratory failure which require 3+ hours per day of interdisciplinary therapy in a comprehensive inpatient rehab setting. Physiatrist is providing close team supervision and 24 hour management of active medical problems listed below. Physiatrist and rehab team continue to assess barriers to discharge/monitor patient progress toward functional and medical goals. FIM: FIM - Bathing Bathing Steps Patient Completed: Chest;Right Arm;Left Arm;Abdomen;Front perineal area;Buttocks;Right  upper leg;Left upper leg Bathing: 4: Min-Patient completes 8-9 [id number] 10 parts or 75+ percent  FIM - Upper Body Dressing/Undressing Upper body dressing/undressing: 0: Wears gown/pajamas-no public clothing FIM - Lower Body Dressing/Undressing Lower body dressing/undressing: 0: Wears gown/pajamas-no public clothing  FIM -  Toileting Toileting steps completed by patient: Performs perineal hygiene Toileting: 2: Max-Patient completed 1 of 3 steps  FIM - Diplomatic Services operational officerToilet Transfers Toilet Transfers Assistive Devices: PsychiatristBedside commode Toilet Transfers: 5-To toilet/BSC: Supervision (verbal cues/safety issues);5-From toilet/BSC: Supervision (verbal cues/safety issues)  FIM - BankerBed/Chair Transfer Bed/Chair Transfer Assistive Devices:  (Pt refused due to pain in chest) Bed/Chair Transfer: 0: Activity did not occur  FIM - Locomotion: Wheelchair Distance: 100 Locomotion: Wheelchair: 0: Activity did not occur FIM - Locomotion: Ambulation Locomotion: Ambulation Assistive Devices: Designer, industrial/productWalker - Rolling Ambulation/Gait Assistance: Not tested (comment) Locomotion: Ambulation: 0: Activity did not occur  Comprehension Comprehension Mode: Auditory Comprehension: 5-Follows basic conversation/direction: With extra time/assistive device  Expression Expression Mode: Verbal Expression Assistive Devices: 6-Talk trach valve Expression: 5-Expresses basic needs/ideas: With no assist  Social Interaction Social Interaction: 6-Interacts appropriately with others with medication or extra time (anti-anxiety, antidepressant).  Problem Solving Problem Solving: 5-Solves basic problems: With no assist  Memory Memory: 6-More than reasonable amt of time  Medical Problem List and Plan:  1. Functional deficits secondary to debilitation after cardiac arrest/respiratory failure.  2. 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 Continue #6 cuffed trach for now given aspiration risk  -IS/FV  -pulmonary toilet  -further recs per ccs  -low grade temp---broaden covg to cover possible tracheobronchitis  2. DVT Prophylaxis/Anticoagulation: Subcutaneous heparin. Monitor platelet counts and any signs of bleeding  3. Pain Management: Tylenol as needed  4.  Dysphagia: spoke with slp regarding swallow---dipslayed severe pharyngeal dysphagia due to  sensorimotor dysfunction---NPO for now  -panda today per INR due to difficult placement  -may need to consider g-tube for safe feeding 5. Neuropsych: This patient is capable of making decisions on his own behalf.  6. Skin/Wound Care: Routine skin care. Trach care as directed  7. Fluids/Electrolytes/Nutrition:  Following weights and I's and o's  8. CAD/CABG. Patient has refused cardiac catheterization in the past  9. Diastolic congestive heart failure. Continue Lasix as directed. Monitor for any signs of fluid overload  10. Diabetes mellitus with peripheral neuropathy. Lantus insulin 40 units twice a day. Check blood sugars a.c. and at bedtime   -adjusted to 50u qam and 40 u qpm  - 11. Tobacco abuse. Provide counseling   12. Chronic renal insufficiency. Baseline creatinine 1.5 did 2.0.   13. Extensive sinus disease left mastoid. Continue Augmentin thru weekend  -consider broader covg to cover trach-bronchitis---will defer to CCS there      LOS (Days) 3 A FACE TO FACE EVALUATION WAS PERFORMED  Richard [name] 07/29/2014 9:46 AM

## 2014-07-29 NOTE — Consult Note (Signed)
Name: Cyril LoosenJimmie G Awtrey MRN: [id number] DOB: 09-25-1961    ADMISSION DATE:  07/26/2014 CONSULTATION DATE:  07/28/14  REFERRING MD :  Hermelinda MedicusSchwartz   CHIEF COMPLAINT:  Trach mgmt   BRIEF PATIENT DESCRIPTION: 53yo male with hx dCHF, COPD, DM, CKD [facility] 9/14 with AMS, hyperkalemia and developed VF/VT arrest.  Course was c/b failed extubation requiring immediate re-intubation (now s/p trach), CAD for which he refused cath, volume overload, and significant dysphagia.  He was [facility]/29 for further rehab efforts.  He cont to have difficulties with dysphagia, increased secretions and low grade fevers and PCCM asked to re-eval.   SIGNIFICANT EVENTS    STUDIES:  FEES 10/1>> severe dysphagia with silent aspiration    HISTORY OF PRESENT ILLNESS:  53yo male with hx dCHF, COPD, DM, CKD [facility] with AMS, hyperkalemia and developed VF/VT arrest.  Course was c/b failed extubation requiring immediate re-intubation (now s/p trach), CAD for which he refused cath, volume overload, and significant dysphagia.  He was [facility] 9/29 for further rehab efforts.  He cont to have difficulties with dysphagia, increased secretions and low grade fevers and PCCM asked to re-eval.  Currently still with increased white secretions, low grade fever. No sig SOB, no chest pain.    SUBJECTIVE:  Reports not being better or worse. VITAL SIGNS: Temp:  [97.6 F (36.4 C)-99 F (37.2 C)] 98.7 F (37.1 C) (10/02 0535) Pulse Rate:  [73-92] 73 (10/02 0535) Resp:  [16-22] 18 (10/02 0535) BP: (123-132)/(50-69) 123/55 mmHg (10/02 0535) SpO2:  [97 %-100 %] 100 % (10/02 0535) FiO2 (%):  [35 %] 35 % (10/02 0400)  PHYSICAL EXAMINATION: General:  Chronically ill appearing male, NAD. Sitting in chair Neuro:  Alert and interactive but falls asleep easily, moving all ext to command. HEENT:  [place]/AT, PERRLA, EOM-I and MMM.   Trach site clean, increased secretion. Purulent gray tracheal secretions noted. Cardiovascular:  RRR, Nl S1/S2, -M/R/G. Lungs:  Coarse BS diffusely. Abdomen:  Soft, NT, ND and +BS. Musculoskeletal:  -edema and -tenderness. Skin:  Intact.   Recent Labs Lab 07/26/14 0428 07/26/14 1948 07/27/14 0755 07/29/14 0520  NA 141  --  141 142  K 3.6*  --  3.6* 4.4  CL 101  --  100 102  CO2 28  --  28 28  BUN 27*  --  25* 17  CREATININE 0.87 0.84 0.83 0.72  GLUCOSE 183*  --  145* 79    Recent Labs Lab 07/26/14 1948 07/27/14 0755 07/29/14 0520  HGB 12.1* 12.4* 12.0*  HCT 38.1* 39.3 38.6*  WBC 12.2* 8.7 9.6  PLT 370 366 PLATELET CLUMPS NOTED ON SMEAR, COUNT APPEARS ADEQUATE   Dg Chest 2 View  07/28/2014   CLINICAL DATA:  Persistent cough.  EXAM: CHEST  2 VIEW  COMPARISON:  July 23, 2014.  FINDINGS: Stable cardiomediastinal silhouette. Tracheostomy tube is unchanged in position. Sternotomy wires are noted. No pneumothorax or pleural effusion is noted. No acute pulmonary disease is noted. Bony thorax is intact.  IMPRESSION: No acute cardiopulmonary abnormality seen.   Electronically Signed   By: Roque LiasJames  Green M.D.   On: 07/28/2014 14:04   Dg Abd Portable 1v  07/28/2014   CLINICAL DATA:  Pain tube placement.  EXAM: PORTABLE ABDOMEN - 1 VIEW  COMPARISON:  07/20/2014.  FINDINGS: Double tube noted projected over the right chest, most likely in right  mainstem bronchus. Repositioning suggested. Prior median sternotomy. No bowel distention.  IMPRESSION: Dobbhoff tube noted projected over the right chest, most likely in right mainstem bronchus. Repositioning suggested. These results were called by telephone at the time of interpretation on 07/28/2014 at 4:17 pm to nurse Chales Abrahams , who verbally acknowledged these results.   Electronically Signed   By: Maisie Fus  Register   On: 07/28/2014 16:20    ASSESSMENT / PLAN:  Chronic trach dependent resp failure - post arrest.  C/b COPD, volume overload,  deconditioning, pulm HTN.  Failed extubation 9/23 with stridor requiring immediate reintubation. Now tol ATC at all times, #6 cuffed shiley.  REC -  Cont BD  Hold off on change to cuffless trach for now with increased secretions and ?aspiration (not supported with c x r) cont PMV as tol  pulm hygiene   Keep dry as tol  Cont abx per primary for extensive sinus disease (Augmentin) - may need to broaden if ?HCAP based on CXR (OK) Also recommend keeping more on the dry side to control secretion. Needs feeding tube    Chronic diastolic CHF  REC -  Lasix per primary  F/u chem  Fu BNP  PCCM will see again Monday.  Brett Canales Minor ACNP [facility] Pager [phone] till 3 pm If no answer page [phone] 07/29/2014, 9:51 AM  Patient seen and examined, agree with above note.  I dictated the care and orders written for this patient under my direction.  Alyson Reedy, MD [phone]

## 2014-07-29 NOTE — Evaluation (Signed)
Speech Language Pathology Cognitive-Linguistic Evaluation   Patient Details  Name: Richard [name] MRN: [id number] Date of Birth: 1961/06/17  SLP Diagnosis: Cognitive Impairments  Rehab Potential: Good ELOS: 10-14 days    Today's Date: 07/29/2014 SLP Individual Time: 0800-0900 SLP Individual Time Calculation (min): 60 min   Problem List:  Patient Active Problem List   Diagnosis Date Noted  . Tracheitis 07/28/2014  . Tracheostomy status 07/28/2014  . Debilitated 07/26/2014  . Acute on chronic kidney failure 07/11/2014  . Hyperkalemia 07/11/2014  . CHF (congestive heart failure) 07/11/2014  . DM (diabetes mellitus) 07/11/2014  . CAD (coronary artery disease) of artery bypass graft 07/11/2014  . HTN (hypertension) 07/11/2014  . COPD (chronic obstructive pulmonary disease) 07/11/2014  . GERD (gastroesophageal reflux disease) 07/11/2014  . Acute encephalopathy 07/11/2014  . CAP (community acquired pneumonia) 07/11/2014  . Cardiac arrest 07/11/2014   Past Medical History:  Past Medical History  Diagnosis Date  . CAD (coronary artery disease) of artery bypass graft 07/11/2014  . Cardiac arrest 07/11/2014  . CHF (congestive heart failure) 07/11/2014  . HTN (hypertension) 07/11/2014  . COPD (chronic obstructive pulmonary disease) 07/11/2014  . DM (diabetes mellitus) 07/11/2014  . GERD (gastroesophageal reflux disease) 07/11/2014  . Acute on chronic kidney failure 07/11/2014   Past Surgical History: No past surgical history on file.  Assessment / Plan / Recommendation Clinical Impression Patient is a 53 y.o. right hand male multi-medical with history of diastolic congestive heart failure, tobacco abuse, coronary artery disease with CABG, COPD, type 2 diabetes mellitus, chronic renal insufficiency with baseline creatinine 1.5-2.0. Independent with a cane due to back pain prior to admission but has a complex social situation as his children are mentally disabled and being taken care of  by other individuals at this time. Admitted from [facility] to [facility] 07/11/2014 with generalized weakness findings of hyperkalemia and altered mental status as well white blood cell count 14,000 blood pressure elevated 199/114. He was treated with Kayexalate for hyperkalemia. While undergoing echocardiogram suddenly developed VF/VT arrest. CT angiogram chest negative for pulmonary emboli. Required intubation for respiratory failure with placement of percutaneous tracheostomy 07/21/2014 per critical care medicine. Patient refused cardiac catheterization to evaluate his coronary artery disease. Plan is to downsize tracheostomy tube 07/28/2014. Subcutaneous heparin for DVT prophylaxis. Maintained on Augmentin x10 days total for findings of extensive sinus disease. Patient transferred to CIR on 07/26/2014. Patient was administered a limited cognitive-linguistic evaluation due to decreased tolerance of PMSV, however, patient communicated by utilizing a dry erase board for written expression. Patient demonstrated mild cognitive impairment characterized by delayed recall of new information and decreased emergent awareness which impacts the patient's safety with functional tasks. Patient would benefit from skilled SLP intervention to maximize his cognitive-linguistic function and overall functional independence.   Skilled Therapeutic Interventions          Administered a cognitive-linguistic evaluation. Please see above for details. Patient unable to tolerate PMSV throughout most of session with reports of shortness of breath, suspect due to large amounts of thick secretions. Patient participated in a basic money management task and was Mod I for functional problem solving. SLP also facilitated session by teaching pharyngeal strenghtening exercises. Patient returned demonstration with supervision cues.   SLP Assessment  Patient will need skilled Speech Lanaguage Pathology Services during CIR admission     Recommendations  Patient may use Passy-Muir Speech Valve: During all therapies with supervision;Intermittently with supervision PMSV Supervision: Full Recommended Consults: FEES Diet Recommendations: NPO Oral  Care Recommendations: Oral care Q4 per protocol Recommendations for Other Services: Neuropsych consult Patient destination: Home Follow up Recommendations: Home Health SLP;24 hour supervision/assistance Equipment Recommended: To be determined    SLP Frequency 5 out of 7 days   SLP Treatment/Interventions Cueing hierarchy;Dysphagia/aspiration precaution training;Functional tasks;Speech/Language facilitation;Therapeutic Activities;Patient/family education    Pain Pain Assessment Pain Assessment: No/denies pain Pain Score: 0-No pain  Short Term Goals: Week 1: SLP Short Term Goal 1 (Week 1): Patient will wear PMSV for 30 minutes with all vitals remaining WFL without evidence of breath stacking with supervision multimodal cues.  SLP Short Term Goal 2 (Week 1): Patient will demonstrate increased speech intelligibility at the word level to 75% intelligible with Min A multimodal cues SLP Short Term Goal 3 (Week 1): Pt will complete pharyngeal strengthening exercise to maximize swallowing function with Mod cues SLP Short Term Goal 4 (Week 1): Patient will recall new, daily information with use of external memory aids with Min A multimodal cues.  SLP Short Term Goal 5 (Week 1): Patient will utilize diaphragmatic breathing at the word level with Mod  A multimodal cues.   See FIM for current functional status Refer to Care Plan for Long Term Goals  Recommendations for other services: Neuropsych  Discharge Criteria: Patient will be discharged from SLP if patient refuses treatment 3 consecutive times without medical reason, if treatment goals not met, if there is a change in medical status, if patient makes no progress towards goals or if patient is discharged from hospital.  The above  assessment, treatment plan, treatment alternatives and goals were discussed and mutually agreed upon: by patient  Deslyn Cavenaugh 07/29/2014, 10:10 AM

## 2014-07-29 NOTE — Progress Notes (Signed)
Physical Therapy Session Note  Patient Details  Name: Cyril LoosenJimmie G Defrancesco MRN: [id number] Date of Birth: 21-Apr-1961  Today's Date: 07/29/2014 PT Individual Time:  - 1300-1403   Duration: 63 mins  Short Term Goals: Week 1:  PT Short Term Goal 1 (Week 1): Pt will perform all aspects of bed mobility at min A level with HOB flat and without rails.  PT Short Term Goal 2 (Week 1): Pt will perform stand pivot transfers at S level PT Short Term Goal 3 (Week 1): Pt will ambulate x 50' w/ LRAD at S level with SaO2 >90%.    Skilled Therapeutic Interventions/Progress Updates:   Pt received sitting in recliner in room, agreeable to therapy session.  RN in room stating his blood glucose was low and needed it to be checked again.  Nurse tech notified and blood sugar in 130's.  Skilled session focused on gait training with and without RW for gait quality and also to increase endurance.  Tolerated 4 bouts of gait (30' up to 40') at one time with RW for two bouts at min A (+2 for equipment and chair follow) and then two small bouts of 10-15' with +2 HHA to decrease UE reliance and to encourage more upright posture.  Pt did demonstrate improved posture, however was very limited in distance due to strength and anxiety and pt declined further gait without device.  Then performed seated nustep x 10 mins with BLE/UEs at level 4-5 resistance to increase pts overall strengthening and endurance.  Educated on BORG RPE scale and that 13-14 is desirable level to achieve during cardio/respiratory workouts.  Pt states he varied from fairly light to somewhat hard (13).  Pt tolerated well with SaO2 remaining in 90's throughout on 28%, 3L per trach collar.  Pt self propelled back to room and in hallway prior to nustep x 100' and another 3050' at S level with cues and min HOH cues for technique with turning.  Pt assisted to beside commode and was continent of urine.  Transferred back to recliner with steady A.  All needs in reach.    Therapy Documentation Precautions:  Precautions Precautions: Fall Precaution Comments: h/o falls PTA per chart, trach and O2 Restrictions Weight Bearing Restrictions: No   Vital Signs: Therapy Vitals Pulse Rate: 86 Resp: 18 Patient Position (if appropriate): Sitting Oxygen Therapy SpO2: 100 % O2 Device: Trach collar O2 Flow Rate (L/min): 8 L/min FiO2 (%): 35 % Pain:No report of pain during session.  See FIM for current functional status  Therapy/Group: Individual Therapy  Vista Deckarcell, Jeily Guthridge Ann 07/29/2014, 1:44 PM

## 2014-07-29 NOTE — Progress Notes (Signed)
Occupational Therapy Session Note  Patient Details  Name: Richard LoosenJimmie G [name] MRN: [id number] Date of Birth: February 13, 1961  Today's Date: 07/29/2014 OT Individual Time: 0700-0800 OT Individual Time Calculation (min): 60 min    Short Term Goals: Week 1:  OT Short Term Goal 1 (Week 1): LB Dressing: Min assist to include sit and stand OT Short Term Goal 2 (Week 1): Toileting: Supervision for 3/3 toilet tasks to include sit and stand OT Short Term Goal 3 (Week 1): Toilet transfer: steady assist with LRAD and standard toilet height OT Short Term Goal 4 (Week 1): Groom:  Stand at sink for grooming tasks OT Short Term Goal 5 (Week 1): Simple HM tasks:  Min assist with LRAD to gather clothes prior to B/Dsg session and/or assist with clean up after B/Dsg  Skilled Therapeutic Interventions/Progress Updates:    Pt resting in recliner upon arrival and agreeable to participating in therapy.  Pt requested use of BSC and transferred recliner<>BSC with supervision.  Pt completed bathing tasks requiring assistance with feet only.  Pt performed sit<>stand X 4 during session without assistance.  Pt declined donning clothing at this time.  O2 sats >90% via trach throuhgout session.  Focus on activity tolerance, sit<>stand, standing balance, transfers, and safety awareness.  Therapy Documentation Precautions:  Precautions Precautions: Fall Precaution Comments: h/o falls PTA per chart, trach and O2 Restrictions Weight Bearing Restrictions: No Pain: Pain Assessment Pain Assessment: No/denies pain  See FIM for current functional status  Therapy/Group: Individual Therapy  [name], Katheryn Culliton Chappell 07/29/2014, 8:01 AM

## 2014-07-30 ENCOUNTER — Inpatient Hospital Stay (HOSPITAL_COMMUNITY): Payer: Medicaid Other | Admitting: Occupational Therapy

## 2014-07-30 ENCOUNTER — Inpatient Hospital Stay (HOSPITAL_COMMUNITY): Payer: Medicaid Other

## 2014-07-30 LAB — GLUCOSE, CAPILLARY
GLUCOSE-CAPILLARY: 153 mg/dL — AB (ref 70–99)
GLUCOSE-CAPILLARY: 209 mg/dL — AB (ref 70–99)
GLUCOSE-CAPILLARY: 228 mg/dL — AB (ref 70–99)
Glucose-Capillary: 139 mg/dL — ABNORMAL HIGH (ref 70–99)
Glucose-Capillary: 149 mg/dL — ABNORMAL HIGH (ref 70–99)
Glucose-Capillary: 170 mg/dL — ABNORMAL HIGH (ref 70–99)

## 2014-07-30 NOTE — Progress Notes (Signed)
[facility] PHYSICAL MEDICINE & REHABILITATION     PROGRESS NOTE    Subjective/Complaints: NGT placed. States he was able to sleep last night  A  review of systems has been performed and if not noted above is otherwise negative.   Objective: Vital Signs: Blood pressure 142/55, pulse 80, temperature 98.4 F (36.9 C), temperature source Oral, resp. rate 20, height 5\' 2"  (1.575 m), weight 92.534 kg (204 lb), SpO2 95.00%. Dg Chest 2 View  07/28/2014   CLINICAL DATA:  Persistent cough.  EXAM: CHEST  2 VIEW  COMPARISON:  July 23, 2014.  FINDINGS: Stable cardiomediastinal silhouette. Tracheostomy tube is unchanged in position. Sternotomy wires are noted. No pneumothorax or pleural effusion is noted. No acute pulmonary disease is noted. Bony thorax is intact.  IMPRESSION: No acute cardiopulmonary abnormality seen.   Electronically Signed   By: Roque Lias M.D.   On: 07/28/2014 14:04   Dg Abd 1 View  07/29/2014   CLINICAL DATA:  Feeding tube placement.  EXAM: ABDOMEN - 1 VIEW  COMPARISON:  None.  FLUOROSCOPY TIME:  1 min 45 seconds.  FINDINGS: Feeding tube was placed under fluoroscopic guidance in the Radiology department, by a radiology technologist. The feeding tube tip is in the horizontal portion of the duodenum. Contrast was injected and position confirmed. A single image was taken. No complicating features.  IMPRESSION: Successful feeding tube placement with tip in the horizontal portion of the duodenum.   Electronically Signed   By: Leanna Battles M.D.   On: 07/29/2014 16:22   Dg Abd Portable 1v  07/28/2014   CLINICAL DATA:  Pain tube placement.  EXAM: PORTABLE ABDOMEN - 1 VIEW  COMPARISON:  07/20/2014.  FINDINGS: Double tube noted projected over the right chest, most likely in right mainstem bronchus. Repositioning suggested. Prior median sternotomy. No bowel distention.  IMPRESSION: Dobbhoff tube noted projected over the right chest, most likely in right mainstem bronchus. Repositioning  suggested. These results were called by telephone at the time of interpretation on 07/28/2014 at 4:17 pm to nurse Chales Abrahams , who verbally acknowledged these results.   Electronically Signed   By: Maisie Fus  Register   On: 07/28/2014 16:20   Dg Basil Dess Tube Plc W/fl-no Rad  07/29/2014   CLINICAL DATA:    NASO G TUBE PLACEMENT WITH FLUORO  Fluoroscopy was utilized by the requesting physician.  No radiographic  interpretation.     Recent Labs  07/29/14 0520  WBC 9.6  HGB 12.0*  HCT 38.6*  PLT PLATELET CLUMPS NOTED ON SMEAR, COUNT APPEARS ADEQUATE    Recent Labs  07/29/14 0520  NA 142  K 4.4  CL 102  GLUCOSE 79  BUN 17  CREATININE 0.72  CALCIUM 8.9   CBG (last 3)   Recent Labs  07/29/14 2105 07/30/14 0002 07/30/14 0447  GLUCAP 86 149* 139*    Wt Readings from Last 3 Encounters:  07/28/14 92.534 kg (204 lb)  07/26/14 93 kg (205 lb 0.4 oz)  07/26/14 93 kg (205 lb 0.4 oz)    Physical Exam:  Constitutional:  No acute distress.  HENT: orlal mucosa pink/moist  Head: Normocephalic.  Eyes: EOM are normal.  Neck:  #6 cuffed trach in place---cuff deflated--yellow, thick secretions dripping from trach.   Cardiovascular:  Cardiac rate controlled. No murmurs. Reg rhythm  Respiratory:  Ongoing scattered rhonchi and wet breath sounds, +  secretions, a few wheezes, occasional respiratory distress  GI: Soft. Bowel sounds are normal. He exhibits no distension.  Neurological: He is alert.  Patient is able to provide his name, age and date of birth. Follows simple commands. CN exam grossly intact. UE: 3+ delt, 4/5 bicep/tricep/HI. LE's: 3/5 HF, 3+KE, 4- ankles. No gross sensory changes. Good sitting balance.  Psychiatric:  Flat, frustrated. Appears depressed Skin: a few scattered echhymoses on limbs.---   Assessment/Plan: 1. Functional deficits secondary to severe deconditioning after respiratory failure which require 3+ hours per day of interdisciplinary therapy in a comprehensive  inpatient rehab setting. Physiatrist is providing close team supervision and 24 hour management of active medical problems listed below. Physiatrist and rehab team continue to assess barriers to discharge/monitor patient progress toward functional and medical goals. FIM: FIM - Bathing Bathing Steps Patient Completed: Chest;Right Arm;Left Arm;Abdomen;Front perineal area;Buttocks;Right upper leg;Left upper leg Bathing: 4: Min-Patient completes 8-9 [id number] 10 parts or 75+ percent  FIM - Upper Body Dressing/Undressing Upper body dressing/undressing: 0: Wears gown/pajamas-no public clothing FIM - Lower Body Dressing/Undressing Lower body dressing/undressing: 0: Wears gown/pajamas-no public clothing  FIM - Toileting Toileting steps completed by patient: Adjust clothing prior to toileting;Performs perineal hygiene;Adjust clothing after toileting Toileting: 7: Independent: No helper, no device  FIM - Diplomatic Services operational officerToilet Transfers Toilet Transfers Assistive Devices: Bedside commode Toilet Transfers: 5-To toilet/BSC: Supervision (verbal cues/safety issues);5-From toilet/BSC: Supervision (verbal cues/safety issues)  FIM - BankerBed/Chair Transfer Bed/Chair Transfer Assistive Devices:  (Pt refused due to pain in chest) Bed/Chair Transfer: 0: Activity did not occur  FIM - Locomotion: Wheelchair Distance: 100 Locomotion: Wheelchair: 2: Travels 50 - 149 ft with supervision, cueing or coaxing FIM - Locomotion: Ambulation Locomotion: Ambulation Assistive Devices: Designer, industrial/productWalker - Rolling Ambulation/Gait Assistance: 4: Min assist;4: Min guard Locomotion: Ambulation: 1: Travels less than 50 ft with minimal assistance (Pt.>75%)  Comprehension Comprehension Mode: Auditory Comprehension: 5-Follows basic conversation/direction: With extra time/assistive device  Expression Expression Mode: Nonverbal Expression Assistive Devices: 6-Talk trach valve Expression: 2-Expresses basic 25 - 49% of the time/requires cueing 50 - 75% of the  time. Uses single words/gestures.  Social Interaction Social Interaction: 4-Interacts appropriately 75 - 89% of the time - Needs redirection for appropriate language or to initiate interaction.  Problem Solving Problem Solving: 5-Solves basic problems: With no assist  Memory Memory: 3-Recognizes or recalls 50 - 74% of the time/requires cueing 25 - 49% of the time  Medical Problem List and Plan:  1. Functional deficits secondary to debilitation after cardiac arrest/respiratory failure.  2. 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 Continue #6 cuffed trach for now given aspiration risk  -IS/FV  -pulmonary toilet  -further recs per ccs  -?broaden covg for likely trach-bronchitis 2. DVT Prophylaxis/Anticoagulation: Subcutaneous heparin. Monitor platelet counts and any signs of bleeding  3. Pain Management: Tylenol as needed  4.  Dysphagia: spoke with slp regarding swallow---dipslayed severe pharyngeal dysphagia due to sensorimotor dysfunction---NPO for now  -panda today per INR due to difficult placement  -may need to consider g-tube for safe feeding 5. Neuropsych: This patient is capable of making decisions on his own behalf.  6. Skin/Wound Care: Routine skin care. Trach care as directed  7. Fluids/Electrolytes/Nutrition:  Following weights and I's and o's  8. CAD/CABG. Patient has refused cardiac catheterization in the past  9. Diastolic congestive heart failure. Continue Lasix as directed. Monitor for any signs of fluid overload  10. Diabetes mellitus with peripheral neuropathy. Lantus insulin 40 units twice a day.   -continue lantus 50u qam and 40 u qpm (am held yesterday due NGT issues)  -may need to adjust with TF----observe today  11. Tobacco abuse. Provide counseling   12. Chronic renal insufficiency. Baseline creatinine 1.5 did 2.0.   13. Extensive sinus disease left mastoid. Continue Augmentin       LOS (Days) 4 A FACE TO FACE EVALUATION WAS  PERFORMED  SWARTZ,ZACHARY T 07/30/2014 9:19 AM

## 2014-07-30 NOTE — Progress Notes (Addendum)
Physical Therapy Session Note  Patient Details  Name: Richard [name] MRN: [id number] Date of Birth: 11-14-1960  Today's Date: 07/30/2014 PT Individual Time: 1000-1100 PT Individual Time Calculation (min): 60 min   Short Term Goals: Week 1:  PT Short Term Goal 1 (Week 1): Pt will perform all aspects of bed mobility at min A level with HOB flat and without rails.  PT Short Term Goal 2 (Week 1): Pt will perform stand pivot transfers at S level PT Short Term Goal 3 (Week 1): Pt will ambulate x 50' w/ LRAD at S level with SaO2 >90%.    Skilled Therapeutic Interventions/Progress Updates:  1:1. Pt received sitting in recliner, ready for therapy. Focus this session on functional endurance, balance and overall safety. Pt unable to tolerate PMSV at seated rest due to decreased management of secretions, req increased suctioning at start of session and consistent cueing for coughing during functional mobility. Pt utilized Public affairs consultantcommunication board throughout session as needed.   Pt with fair tolerance to w/c propulsion 100'x1 and 50'x2 w/ combination of B UE/LE, req supervision overall. Pt amb to amb 40'x1 and 50'x1 w/ RW and min A for balance, +2 for w/c follow and management of lines. Pt req cueing for decreased pace for improved functional endurance as well as overall posture. Pt req seated rest throughout session due to fatigue. Pt req min guard A for all t/f sit<>stand and SPT w/ cues for hand placement.   Berg Balance Test performed to assess pt's dynamic balance, scored 29/56 points. Pt educated on results and verbalized understanding. Results indicate that pt remains at high risk for falls.   Pt able to maintain SaO2 >90% on 28%, 3L per trach collar majority of session with exception of decreasing to high 80's following bouts of ambulation, but resolving quickly with seated rest. Pt left sitting in recliner at end of session w/ all needs in reach.   Therapy Documentation Precautions:   Precautions Precautions: Fall Precaution Comments: h/o falls PTA per chart, trach and O2 Restrictions Weight Bearing Restrictions: No  Pain: Pain Assessment Pain Assessment: No/denies pain  Balance: Balance Balance Assessed: Yes Standardized Balance Assessment Standardized Balance Assessment: Berg Balance Test Berg Balance Test Sit to Stand: Able to stand  independently using hands Standing Unsupported: Able to stand 2 minutes with supervision Sitting with Back Unsupported but Feet Supported on Floor or Stool: Able to sit safely and securely 2 minutes Stand to Sit: Sits safely with minimal use of hands Transfers: Able to transfer with verbal cueing and /or supervision Standing Unsupported with Eyes Closed: Able to stand 10 seconds with supervision Standing Ubsupported with Feet Together: Able to place feet together independently but unable to hold for 30 seconds From Standing, Reach Forward with Outstretched Arm: Reaches forward but needs supervision From Standing Position, Pick up Object from Floor: Able to pick up shoe, needs supervision From Standing Position, Turn to Look Behind Over each Shoulder: Needs supervision when turning Turn 360 Degrees: Needs close supervision or verbal cueing Standing Unsupported, Alternately Place Feet on Step/Stool: Able to complete >2 steps/needs minimal assist Standing Unsupported, One Foot in Front: Needs help to step but can hold 15 seconds Standing on One Leg: Unable to try or needs assist to prevent fall Total Score: 29  See FIM for current functional status  Therapy/Group: Individual Therapy  [name], Mikesha Migliaccio S 07/30/2014, 12:46 PM

## 2014-07-30 NOTE — Progress Notes (Signed)
Occupational Therapy Session Note  Patient Details  Name: Cyril LoosenJimmie G Wittke MRN: [id number] Date of Birth: January 07, 1961  Today's Date: 07/30/2014 OT Individual Time: [phone] and OT Individual Time Calculation (min): 60 min and   Short Term Goals: Week 1:  OT Short Term Goal 1 (Week 1): LB Dressing: Min assist to include sit and stand OT Short Term Goal 2 (Week 1): Toileting: Supervision for 3/3 toilet tasks to include sit and stand OT Short Term Goal 3 (Week 1): Toilet transfer: steady assist with LRAD and standard toilet height OT Short Term Goal 4 (Week 1): Groom:  Stand at sink for grooming tasks OT Short Term Goal 5 (Week 1): Simple HM tasks:  Min assist with LRAD to gather clothes prior to B/Dsg session and/or assist with clean up after B/Dsg  Skilled Therapeutic Interventions/Progress Updates:  Session 1: Pt seated in recliner chair upon entering the room with no c/o pain. Pt appeared to have large amount of secretions coming from trach collar. RN notified immediately and patient suctioned. Pt sat at edge of recliner chair for B and D session. Pt performed bathing with Min A to wash B feet. Pt performing STS x 6 reps with supervision from recliner chair. Pt requiring steady assist when standing to wash peri area and buttocks. Stats at or above 90% during session at 8L with trach collar. Pt did require frequent rest breaks secondary to fatigue during session. OT session with focus on dynamic standing balance, STS, activity tolerance, energy conservation, and safety awareness.   Session 2: Pt continues to be seated in recliner chair upon entering the room with nephew entering room shortly after. Pt utilized communication board to tell therapist neck was sore. OT performed manual massage to these areas in an effort to decrease pain and give comfort to patient. Pt continues to have large amounts of secretions during session with therapist encouraging pt to have strong coughs to assist with  getting secretions out. Pt performed 3 sets of 15 chest presses, straight arm raises, forward rows, and backward rows with 4 lbs dowel rod and rest breaks between sets secondary to fatigue. Pt seated in recliner chair with call bell and family present upon exiting the room.   Therapy Documentation Precautions:  Precautions Precautions: Fall Precaution Comments: h/o falls PTA per chart, trach and O2 Restrictions Weight Bearing Restrictions: No Vital Signs: Therapy Vitals Pulse Rate: 90 Resp: 20 Oxygen Therapy SpO2: 98 % O2 Device: Trach collar O2 Flow Rate (L/min): 8 L/min FiO2 (%): 35 % Pain: Pain Assessment Pain Assessment: No/denies pain  See FIM for current functional status  Therapy/Group: Individual Therapy  Lowella Gripittman, Darryll Raju L 07/30/2014, 12:40 PM

## 2014-07-30 NOTE — Progress Notes (Signed)
Speech Language Pathology Daily Session Note  Patient Details  Name: Richard [name] MRN: [id number] Date of Birth: [birth year]  Today's Date: 07/30/2014 SLP Individual Time: [id number] SLP Individual Time Calculation (min): 15 min  Short Term Goals: Week 1: SLP Short Term Goal 1 (Week 1): Patient will wear PMSV for 30 minutes with all vitals remaining WFL without evidence of breath stacking with supervision multimodal cues.  SLP Short Term Goal 2 (Week 1): Patient will demonstrate increased speech intelligibility at the word level to 75% intelligible with Min A multimodal cues SLP Short Term Goal 3 (Week 1): Pt will complete pharyngeal strengthening exercise to maximize swallowing function with Mod cues SLP Short Term Goal 4 (Week 1): Patient will recall new, daily information with use of external memory aids with Min A multimodal cues.  SLP Short Term Goal 5 (Week 1): Patient will utilize diaphragmatic breathing at the word level with Mod  A multimodal cues.   Skilled Therapeutic Interventions: Skilled treatment session focused on addressing recall and carryover of dysphagia exercises.   Upon arrival, patient was sitting upright in his recliner. Patient refused to allow SLP to don PMSV and wrote that it makes him cough.  Per his report he had it donned earlier in the day and could not tolerate it; Respiratory Therapist confirmed.  Patient wrote that he does not like to be suctioned; SLP educated on how hard coughs that allow him to expectorate secretions via trach will result in decreased need for nursing and respiratory therapy to deep suction him.  Patient was able to return demonstration with Min verbal cues.  Patient required Mod cues to recall and Max cues of encouragement to perform Masako Maneuver x10.  Recommend downgrade PMSV use to with SLP only at this time.   FIM:  Comprehension Comprehension Mode: Auditory Comprehension: 5-Follows basic conversation/direction: With extra  time/assistive device Expression Expression Mode: Nonverbal Expression Assistive Devices: 6-Communication board Expression: 5-Expresses basic needs/ideas: With no assist Social Interaction Social Interaction: 4-Interacts appropriately 75 - 89% of the time - Needs redirection for appropriate language or to initiate interaction. Problem Solving Problem Solving: 5-Solves basic problems: With no assist Memory Memory: 3-Recognizes or recalls 50 - 74% of the time/requires cueing 25 - 49% of the time  Pain Pain Assessment Pain Assessment: No/denies pain  Therapy/Group: Individual Therapy  Charlane FerrettiMelissa Amery Vandenbos, M.A., CCC-SLP [phone]  Proctor Carriker 07/30/2014, 12:33 PM

## 2014-07-31 ENCOUNTER — Inpatient Hospital Stay (HOSPITAL_COMMUNITY): Payer: Medicaid Other | Admitting: *Deleted

## 2014-07-31 ENCOUNTER — Inpatient Hospital Stay (HOSPITAL_COMMUNITY): Payer: Medicaid Other

## 2014-07-31 LAB — GLUCOSE, CAPILLARY
GLUCOSE-CAPILLARY: 177 mg/dL — AB (ref 70–99)
GLUCOSE-CAPILLARY: 207 mg/dL — AB (ref 70–99)
GLUCOSE-CAPILLARY: 222 mg/dL — AB (ref 70–99)
GLUCOSE-CAPILLARY: 254 mg/dL — AB (ref 70–99)
Glucose-Capillary: 179 mg/dL — ABNORMAL HIGH (ref 70–99)
Glucose-Capillary: 243 mg/dL — ABNORMAL HIGH (ref 70–99)
Glucose-Capillary: 214 mg/dL — ABNORMAL HIGH (ref 70–99)

## 2014-07-31 MED ORDER — INSULIN GLARGINE 100 UNIT/ML ~~LOC~~ SOLN
50.0000 [IU] | Freq: Every day | SUBCUTANEOUS | Status: DC
  Administered 2014-07-31 – 2014-08-01 (×2): 50 [IU] via SUBCUTANEOUS
  Filled 2014-07-31 (×2): qty 0.5

## 2014-07-31 NOTE — Progress Notes (Signed)
Physical Therapy Session Note  Patient Details  Name: Richard LoosenJimmie G [name] MRN: [id number] Date of Birth: 1961/04/28  Today's Date: 07/31/2014 PT Individual Time: 1103-1203 PT Individual Time Calculation (min): 60 min   Short Term Goals: Week 1:  PT Short Term Goal 1 (Week 1): Pt will perform all aspects of bed mobility at min A level with HOB flat and without rails.  PT Short Term Goal 2 (Week 1): Pt will perform stand pivot transfers at S level PT Short Term Goal 3 (Week 1): Pt will ambulate x 50' w/ LRAD at S level with SaO2 >90%.    Skilled Therapeutic Interventions/Progress Updates:    Pt received seated in recliner, agreeable to participate in therapy. PMSV not applied on therapist arrival, did not don valve due to excessive secretions from trach requiring frequent suction. Pt intermittently became agitated w/ suction, agreed to allow therapist to suction outer rim of trach and collar. Pt on 6L O2 throughout session. Discussed TF w/ pt's RN, confirmed that TF could continue running during therapy. Pt transferred w/ stand pivot transfer recliner<>w/c w/ therapist providing MinA and managing lines/leads. After transferring to w/c, pt asked for communciation board and wrote "I am not going to do as much today until these wires come out" (last three words very illegible compared to rest of sentence). Pt agreed to propel w/c w/ therapy this AM. Pt propelled w/c 100' w/ several rest breaks and MinA for managing lines/leads. During transfer back to recliner, pt arranged cushion, towels, and pillows on recliner showing good dynamic standing balance w/ therapist providing MinGuard. Pt left seated in recliner w/ all needs within reach.  Therapy Documentation Precautions:  Precautions Precautions: Fall Precaution Comments: h/o falls PTA per chart, trach and O2 Restrictions Weight Bearing Restrictions: No General:   Vital Signs: Therapy Vitals Temp: 98.5 F (36.9 C) Temp Source: Axillary Pulse  Rate: 81 Resp: 18 BP: 132/66 mmHg Patient Position (if appropriate): Sitting Oxygen Therapy SpO2: 100 % O2 Device: Trach collar O2 Flow Rate (L/min): 8 L/min FiO2 (%): 95 % Pain:   Mobility:   Locomotion :    Trunk/Postural Assessment :    Balance:   Exercises:   Other Treatments:    See FIM for current functional status  Therapy/Group: Individual Therapy  Richard SpangleGodfrey, Richard Pusch Richard [name], PT, DPT 07/31/2014, 7:52 AM

## 2014-07-31 NOTE — Progress Notes (Signed)
[facility] PHYSICAL MEDICINE & REHABILITATION     PROGRESS NOTE    Subjective/Complaints: Pt/RN report fairly uneventful night.   A  review of systems has been performed and if not noted above is otherwise negative.   Objective: Vital Signs: Blood pressure 132/66, pulse 81, temperature 98.5 F (36.9 C), temperature source Axillary, resp. rate 18, height 5\' 2"  (1.575 m), weight 95.936 kg (211 lb 8 oz), SpO2 100.00%. Dg Abd 1 View  07/29/2014   CLINICAL DATA:  Feeding tube placement.  EXAM: ABDOMEN - 1 VIEW  COMPARISON:  None.  FLUOROSCOPY TIME:  1 min 45 seconds.  FINDINGS: Feeding tube was placed under fluoroscopic guidance in the Radiology department, by a radiology technologist. The feeding tube tip is in the horizontal portion of the duodenum. Contrast was injected and position confirmed. A single image was taken. No complicating features.  IMPRESSION: Successful feeding tube placement with tip in the horizontal portion of the duodenum.   Electronically Signed   By: Leanna Battles M.D.   On: 07/29/2014 16:22   Dg Vangie Bicker G Tube Plc W/fl-no Rad  07/29/2014   CLINICAL DATA:    NASO G TUBE PLACEMENT WITH FLUORO  Fluoroscopy was utilized by the requesting physician.  No radiographic  interpretation.     Recent Labs  07/29/14 0520  WBC 9.6  HGB 12.0*  HCT 38.6*  PLT PLATELET CLUMPS NOTED ON SMEAR, COUNT APPEARS ADEQUATE    Recent Labs  07/29/14 0520  NA 142  K 4.4  CL 102  GLUCOSE 79  BUN 17  CREATININE 0.72  CALCIUM 8.9   CBG (last 3)   Recent Labs  07/30/14 2100 07/31/14 0008 07/31/14 0427  GLUCAP 228* 222* 254*    Wt Readings from Last 3 Encounters:  07/31/14 95.936 kg (211 lb 8 oz)  07/26/14 93 kg (205 lb 0.4 oz)  07/26/14 93 kg (205 lb 0.4 oz)    Physical Exam:  Constitutional:  No acute distress.  HENT: orlal mucosa pink/moist  Head: Normocephalic.  Eyes: EOM are normal.  Neck:  #6 cuffed trach in place---cuff deflated--less secretions from trach.   Cardiac rate controlled. No murmurs. Reg rhythm  Respiratory:  Ongoing scattered rhonchi and wet breath sounds, +  secretions, a few wheezes, occasional respiratory distress  GI: Soft. Bowel sounds are normal. He exhibits no distension.  Neurological: .  Patient is able to provide his name, age and date of birth. Follows simple commands. CN exam grossly intact. UE: 3+ delt, 4/5 bicep/tricep/HI. LE's: 3/5 HF, 3+KE, 4- ankles. No gross sensory changes. Good sitting balance.  Psychiatric:  Flat, frustrated. Appears more alert today Skin: a few scattered echhymoses on limbs still   Assessment/Plan: 1. Functional deficits secondary to severe deconditioning after respiratory failure which require 3+ hours per day of interdisciplinary therapy in a comprehensive inpatient rehab setting. Physiatrist is providing close team supervision and 24 hour management of active medical problems listed below. Physiatrist and rehab team continue to assess barriers to discharge/monitor patient progress toward functional and medical goals.  FIM: FIM - Bathing Bathing Steps Patient Completed: Chest;Right Arm;Left Arm;Abdomen;Front perineal area;Buttocks;Right upper leg;Left upper leg Bathing: 4: Min-Patient completes 8-9 28f 10 parts or 75+ percent  FIM - Upper Body Dressing/Undressing Upper body dressing/undressing: 0: Wears gown/pajamas-no public clothing FIM - Lower Body Dressing/Undressing Lower body dressing/undressing: 0: Wears gown/pajamas-no public clothing  FIM - Toileting Toileting steps completed by patient: Adjust clothing prior to toileting;Performs perineal hygiene;Adjust clothing after toileting Toileting: 0: Activity  did not occur  FIM - Diplomatic Services operational officerToilet Transfers Toilet Transfers Assistive Devices: Bedside commode Toilet Transfers: 0-Activity did not occur  FIM - BankerBed/Chair Transfer Bed/Chair Transfer Assistive Devices: Arm rests Bed/Chair Transfer: 4: Bed > Chair or W/C: Min A (steadying Pt. >  75%);4: Chair or W/C > Bed: Min A (steadying Pt. > 75%)  FIM - Locomotion: Wheelchair Distance: 100 Locomotion: Wheelchair: 2: Travels 50 - 149 ft with supervision, cueing or coaxing FIM - Locomotion: Ambulation Locomotion: Ambulation Assistive Devices: Designer, industrial/productWalker - Rolling Ambulation/Gait Assistance: 4: Min assist;4: Min guard;1: +2 Total assist (+2 for management of w/c and lines) Locomotion: Ambulation: 1: Two helpers  Comprehension Comprehension Mode: Auditory Comprehension: 5-Follows basic conversation/direction: With no assist  Expression Expression Mode: Nonverbal Expression Assistive Devices: 6-Communication board Expression: 5-Expresses basic needs/ideas: With extra time/assistive device  Social Interaction Social Interaction: 4-Interacts appropriately 75 - 89% of the time - Needs redirection for appropriate language or to initiate interaction.  Problem Solving Problem Solving: 5-Solves basic problems: With no assist  Memory Memory: 3-Recognizes or recalls 50 - 74% of the time/requires cueing 25 - 49% of the time  Medical Problem List and Plan:  1. Functional deficits secondary to debilitation after cardiac arrest/respiratory failure.  2. 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 Continue #6 cuffed trach for now given aspiration risk  -IS/FV  -pulmonary toilet  -continue augmentin  -secretions seem a little better today    2. DVT Prophylaxis/Anticoagulation: Subcutaneous heparin. Monitor platelet counts and any signs of bleeding  3. Pain Management: Tylenol as needed  4.  Dysphagia: spoke with slp regarding swallow---dipslayed severe pharyngeal dysphagia due to sensorimotor dysfunction---NPO for now  -panda today per INR due to difficult placement  -may need to consider g-tube for safe feeding 5. Neuropsych: This patient is capable of making decisions on his own behalf.  6. Skin/Wound Care: Routine skin care. Trach care as directed  7.  Fluids/Electrolytes/Nutrition:  Following weights and I's and o's  8. CAD/CABG. Patient has refused cardiac catheterization in the past  9. Diastolic congestive heart failure. Continue Lasix as directed. Monitor for any signs of fluid overload  10. Diabetes mellitus with peripheral neuropathy.   -increase lantus to 50u qam and 50 u qpm with continues TF    11. Tobacco abuse. Provide counseling   12. Chronic renal insufficiency. Baseline creatinine 1.5 did 2.0.   13. Extensive sinus disease left mastoid. Continue Augmentin for time being        LOS (Days) 5 A FACE TO FACE EVALUATION WAS PERFORMED  Dhaval Woo T 07/31/2014 8:13 AM

## 2014-07-31 NOTE — Progress Notes (Signed)
Occupational Therapy Session Note  Patient Details  Name: Richard [name] MRN: [id number] Date of Birth: 03/11/1961  Today's Date: 07/31/2014 OT Individual Time:  -   0900-1000  (60 min)  1st session  Short Term Goals: Week 1:  OT Short Term Goal 1 (Week 1): LB Dressing: Min assist to include sit and stand OT Short Term Goal 2 (Week 1): Toileting: Supervision for 3/3 toilet tasks to include sit and stand OT Short Term Goal 3 (Week 1): Toilet transfer: steady assist with LRAD and standard toilet height OT Short Term Goal 4 (Week 1): Groom:  Stand at sink for grooming tasks OT Short Term Goal 5 (Week 1): Simple HM tasks:  Min assist with LRAD to gather clothes prior to B/Dsg session and/or assist with clean up after B/Dsg :     Skilled Therapeutic Interventions/Progress Updates:     1st session:   Pt. Sitting in recliner with 8 liters @ 35 %.  Focus of treatment was, transfers,  sitting balance, standing balance, therapeutic activities,endurance, breathing and coughing strategies .  Pt bathed LB using AE.  Stood for peri area with min assist for 3 minutes .  Dressed in hosp gown per pt perference even after encouragement. don clothes.    Transferred to 3n1 with minimal assist and back to recliner.   O2 remained  at 95% to 100 % during session.  Educated pt on breathing and coughing to expel mucus and congestion.  Pt. Followed directions and did more frequently.  Left with NA to finish with room maintenance.      Therapy Documentation Precautions:  Precautions Precautions: Fall Precaution Comments: h/o falls PTA per chart, trach and O2 Restrictions Weight Bearing Restrictions: No    Vital Signs: Therapy Vitals Pulse Rate: 85 Resp: 20 Patient Position (if appropriate): Sitting Oxygen Therapy SpO2: 97 % O2 Device: Trach collar O2 Flow Rate (L/min): 8 L/min FiO2 (%): 35 % Pain:  6/10 neck    Other Treatments:    2nd session:  Time:1300-1400  (60 min) Pain:6/10 Individual  sessio:  Pt sitting in recliner with trach collar and oxygen on 28% 3 liters.  Addressed endurance exercises using SCI Fit.  Pt. Propelled wc to gym with 2 rest breaks  (o2= 95 and H= 83.  Performed Sci Fit at 1 level for 2 min, with pt doing 40-42 RPM.  Oxygen=97 % and HR= 83.  Rested for 4 minutes.  Did 3 more minutes and  All [facility]als were [facility].  Pt. Propelled wc back to room.  Transferred to recliner with minimal assist.  Left with all needs in reach.    See FIM for current functional status  Therapy/Group: Individual Therapy  [name], [name] 07/31/2014, 10:09 AM

## 2014-07-31 NOTE — Progress Notes (Signed)
Pt has refused suctioning the last two times RT has assessed. Pt has very strong, productive cough with thick, milky white/clear secretions. RT will continue to monitor.

## 2014-08-01 ENCOUNTER — Encounter (HOSPITAL_COMMUNITY): Payer: Medicaid Other

## 2014-08-01 ENCOUNTER — Inpatient Hospital Stay (HOSPITAL_COMMUNITY): Payer: Medicaid Other | Admitting: Speech Pathology

## 2014-08-01 ENCOUNTER — Inpatient Hospital Stay (HOSPITAL_COMMUNITY): Payer: Medicaid Other

## 2014-08-01 DIAGNOSIS — Z930 Tracheostomy status: Secondary | ICD-10-CM

## 2014-08-01 DIAGNOSIS — G9340 Encephalopathy, unspecified: Secondary | ICD-10-CM

## 2014-08-01 LAB — GLUCOSE, CAPILLARY
Glucose-Capillary: 210 mg/dL — ABNORMAL HIGH (ref 70–99)
Glucose-Capillary: 233 mg/dL — ABNORMAL HIGH (ref 70–99)
Glucose-Capillary: 176 mg/dL — ABNORMAL HIGH (ref 70–99)
Glucose-Capillary: 124 mg/dL — ABNORMAL HIGH (ref 70–99)
Glucose-Capillary: 180 mg/dL — ABNORMAL HIGH (ref 70–99)

## 2014-08-01 MED ORDER — SCOPOLAMINE 1 MG/3DAYS TD PT72
1.0000 | MEDICATED_PATCH | TRANSDERMAL | Status: DC
  Administered 2014-08-01 – 2014-08-22 (×8): 1.5 mg via TRANSDERMAL
  Filled 2014-08-01 (×9): qty 1

## 2014-08-01 MED ORDER — FUROSEMIDE 10 MG/ML IJ SOLN
40.0000 mg | Freq: Two times a day (BID) | INTRAMUSCULAR | Status: AC
  Administered 2014-08-01 – 2014-08-02 (×4): 40 mg via INTRAVENOUS
  Filled 2014-08-01 (×4): qty 4

## 2014-08-01 MED ORDER — FUROSEMIDE 20 MG PO TABS
20.0000 mg | ORAL_TABLET | Freq: Every day | ORAL | Status: DC
  Administered 2014-08-05 – 2014-08-23 (×19): 20 mg
  Filled 2014-08-01 (×23): qty 1

## 2014-08-01 NOTE — Progress Notes (Signed)
Social Work Social Work Assessment and Plan  Patient Details  Name: Richard [name] MRN: [id number] Date of Birth: 1961-10-14  Today's Date: 07/29/2014  Problem List:  Patient Active Problem List   Diagnosis Date Noted  . Tracheitis 07/28/2014  . Tracheostomy status 07/28/2014  . Debilitated 07/26/2014  . Acute on chronic kidney failure 07/11/2014  . Hyperkalemia 07/11/2014  . CHF (congestive heart failure) 07/11/2014  . DM (diabetes mellitus) 07/11/2014  . CAD (coronary artery disease) of artery bypass graft 07/11/2014  . HTN (hypertension) 07/11/2014  . COPD (chronic obstructive pulmonary disease) 07/11/2014  . GERD (gastroesophageal reflux disease) 07/11/2014  . Acute encephalopathy 07/11/2014  . CAP (community acquired pneumonia) 07/11/2014  . Cardiac arrest 07/11/2014   Past Medical History:  Past Medical History  Diagnosis Date  . CAD (coronary artery disease) of artery bypass graft 07/11/2014  . Cardiac arrest 07/11/2014  . CHF (congestive heart failure) 07/11/2014  . HTN (hypertension) 07/11/2014  . COPD (chronic obstructive pulmonary disease) 07/11/2014  . DM (diabetes mellitus) 07/11/2014  . GERD (gastroesophageal reflux disease) 07/11/2014  . Acute on chronic kidney failure 07/11/2014   Past Surgical History: No past surgical history on file. Social History:  has no tobacco, alcohol, and drug history on file.  Family / Support Systems Marital Status: Divorced Patient Roles: Other (Comment);Parent (lives with nephew;  lost custody of daughters "years ago") Children: of note, per pt's sister,  Mamie, pt has two adult, "severely mentally retarded" daughters who live with their maternal grandmother in [place] Other Supports: pt's sister, Estil Daft (lives very closeby) @ (C) [phone];  sister, Cooper Render (S.C.) @ (C) [phone];  nephew (lives with pt), Gerhard Munch Anticipated Caregiver: self and nephew Ability/Limitations of Caregiver: Nephew lives with  patient.  Sister says they cannot help each other much.  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 Caregiver Availability: Intermittent Family Dynamics: Sister reports that, because she lives close by, "they call me all the time to help 'em...if their toilet gets clogged they call me!"  Adds that "... since they moved close to be 5 years ago, they've just about driven me crazy."  Social History Preferred language: English Religion: Baptist Cultural Background: NA Education: Sister reports that pt did complete HS and work for many years with [employer] Read: Yes ("not so good" but does not know how limited he is) Write: Yes Employment Status: Disabled Date Retired/Disabled/Unemployed: one year Fish farm manager Issues: None Guardian/Conservator: None - per MD, pt is capable of making decisions on his own behalf   Abuse/Neglect Physical Abuse: Denies Verbal Abuse: Denies Sexual Abuse: Denies Exploitation of patient/patient's resources: Denies Self-Neglect: Denies  Emotional Status Pt's affect, behavior adn adjustment status: Pt having much difficulty communicating via trach and agreeable to my gathering information from his sister.  Does not appear to be experiencing any emotional distress, however, will monitor as his communication abilities improve and refer to neuropsych as appropriate. Recent Psychosocial Issues: None of note.  Per sister, he and nephew have been "managing somehow" for several years living together and both doing "odd jobs" Pyschiatric History: None Substance Abuse History: significant tobacco abuse;  sister reports pt had stopped smoking for 7 months following CABG last year but then resumed  Patient / Family Perceptions, Expectations & Goals Pt/Family understanding of illness & functional limitations: Sister with very limited understanding of pt's medical issues, however, she is  aware he still has  a trach and reports that could be a barrier to pt d/c home - "I don't think they (pt and nephew) could handle that." Premorbid pt/family roles/activities: Sister states, "He has sat on his tail 24/7 for the last 5 years and Dorinda HillDonald bring everything to him."  Reports Dorinda HillDonald performed most home management tasks and that they managed their own finances. Anticipated changes in roles/activities/participation: Dependent on pt's progress, significant caregiver support could be needed for pt at home. Pt/family expectations/goals: "He will need to get that thing out of his neck before he goes home."  [employer] Community Agencies: None (None that sister is aware of ) Premorbid Home Care/DME Agencies: None Transportation available at discharge: Family could assist intermittently Resource referrals recommended: Neuropsychology;Advocacy groups  Discharge Planning Living Arrangements: Other relatives Support Systems: Other relatives Type of Residence: Private residence Insurance Resources: Medicaid (specify county) Software engineer([place]) Financial Resources: SSI Financial Screen Referred: No Living Expenses: DietitianMotgage (bought house with his nephew) Money Management: Patient Does the patient have any problems obtaining your medications?: No Home Management: mostly managed by pt's nephew Patient/Family Preliminary Plans: Pt plans to return home with nephew as primary support. Barriers to Discharge: Family Support;Self care (per sister, pt and nephew both "very slow (mentally)") Social Work Anticipated Follow Up Needs: HH/OP Expected length of stay: 10-12 days  Clinical Impression Very unfortunate gentleman here following cardiac and resp failure.  Now with trach and PMSV but having difficulty communicating much to me.  Pt was agreeable to having me contact his sister, Mamie, who was able to provide information about pt, living situation and d/c plans.  Will need to make sure adequate family  education is completed as sister with some concerns about pt and nephew managing at home on their own.  Will follow for support and d/c planning needs.  Lira Stephen 07/29/2014, 3:12 PM

## 2014-08-01 NOTE — Progress Notes (Signed)
Occupational Therapy Session Note  Patient Details  Name: Richard LoosenJimmie G Beedle MRN: [id number] Date of Birth: 11-Apr-1961  Today's Date: 08/01/2014 OT Individual Time: 1000-1100 OT Individual Time Calculation (min): 60 min    Short Term Goals: Week 1:  OT Short Term Goal 1 (Week 1): LB Dressing: Min assist to include sit and stand OT Short Term Goal 2 (Week 1): Toileting: Supervision for 3/3 toilet tasks to include sit and stand OT Short Term Goal 3 (Week 1): Toilet transfer: steady assist with LRAD and standard toilet height OT Short Term Goal 4 (Week 1): Groom:  Stand at sink for grooming tasks OT Short Term Goal 5 (Week 1): Simple HM tasks:  Min assist with LRAD to gather clothes prior to B/Dsg session and/or assist with clean up after B/Dsg  Skilled Therapeutic Interventions/Progress Updates:    Pt resting in recliner upon arrival.  Pt agreeable to bathing with sit<>stand from recliner but continues to decline donning clothing.  Pt continually coughed up clear secretions throughout sessions but O2 sats >90% via trach throughout session.  Pt requested use of BSC and performed stand pivot transfer with supervision.  Pt performed hygiene task with supervision.  Pt required assistance managing tubes throughout session (feeding tube, IV, and O2 tubing).  Attempted BUE therex with 5# weight bar but pt c/o pain in arms secondary to increased swelling.  Pt communicated that his arms and legs felt tight.  Focus on activity tolerance, transfers, sit<>stand, standing balance, and safety awareness.  Therapy Documentation Precautions:  Precautions Precautions: Fall Precaution Comments: h/o falls PTA per chart, trach and O2 Restrictions Weight Bearing Restrictions: No Pain: Pain Assessment Pain Assessment: Faces Pain Score: 5  Faces Pain Scale: Hurts little more Pain Type: Acute pain Pain Location: Arm Pain Orientation: Right;Left Pain Descriptors / Indicators: Discomfort Pain Onset:  On-going Pain Intervention(s): RN made aware  See FIM for current functional status  Therapy/Group: Individual Therapy  [name], Mertice Uffelman Chappell 08/01/2014, 11:14 AM

## 2014-08-01 NOTE — Progress Notes (Signed)
Name: Richard [name] MRN: [id number] DOB: September 09, 1961    ADMISSION DATE:  07/26/2014 CONSULTATION DATE:  07/28/14  REFERRING MD :  Hermelinda Medicus   CHIEF COMPLAINT:  Trach mgmt   BRIEF PATIENT DESCRIPTION: 53 y/o male with hx dCHF, COPD, DM, CKD initially admitted to [facility] from [facility] 9/14 with AMS, hyperkalemia and developed VF/VT arrest.  Course was c/b failed extubation requiring immediate re-intubation (now s/p trach), CAD for which he refused cath, volume overload, and significant dysphagia.  He was tx to [facility] re[facility] 9/29 for further rehab efforts.  He cont to have difficulties with dysphagia, increased secretions and low grade fevers and PCCM asked to re-eval.   SIGNIFICANT EVENTS    STUDIES:  FEES 10/1>> severe dysphagia with silent aspiration       SUBJECTIVE:  Pt reports he is swollen and still has a lot of secretions.  Afebrile, no acute events.    VITAL SIGNS: Temp:  [98.2 F (36.8 C)-98.8 F (37.1 C)] 98.8 F (37.1 C) (10/05 0505) Pulse Rate:  [79-90] 84 (10/05 0903) Resp:  [16-20] 16 (10/05 0903) BP: (116-140)/(47-71) 116/71 mmHg (10/05 0505) SpO2:  [94 %-98 %] 96 % (10/05 0903) FiO2 (%):  [35 %] 35 % (10/05 0903) Weight:  [216 lb 11.4 oz (98.3 kg)] 216 lb 11.4 oz (98.3 kg) (10/05 0454)  PHYSICAL EXAMINATION: General:  Chronically ill appearing male, NAD. Sitting in chair Neuro:  Alert and interactive, MAE HEENT:  [place]/AT, PERRLA, EOM-I and MMM.  Trach site clean, increased secretion. Thin clear tracheal secretions noted. Cardiovascular:  RRR, Nl S1/S2, -M/R/G. Lungs:  Coarse BS diffusely. Abdomen:  Soft, NT, ND and +BS. Musculoskeletal: 2-3+ pitting edema, generalized and -tenderness. Skin:  Intact.   PULMONARY No results found for this basename: PHART, PCO2, PCO2ART, PO2, PO2ART, HCO3, TCO2, O2SAT,  in the last 168 hours  CBC  Recent Labs Lab 07/26/14 1948 07/27/14 0755 07/29/14 0520  HGB 12.1* 12.4* 12.0*  HCT 38.1* 39.3 38.6*  WBC  12.2* 8.7 9.6  PLT 370 366 PLATELET CLUMPS NOTED ON SMEAR, COUNT APPEARS ADEQUATE    COAGULATION No results found for this basename: INR,  in the last 168 hours  CARDIAC  No results found for this basename: TROPONINI,  in the last 168 hours  Recent Labs Lab 07/29/14 0520  PROBNP 285.6*     CHEMISTRY  Recent Labs Lab 07/26/14 0428 07/26/14 1948 07/27/14 0755 07/29/14 0520  NA 141  --  141 142  K 3.6*  --  3.6* 4.4  CL 101  --  100 102  CO2 28  --  28 28  GLUCOSE 183*  --  145* 79  BUN 27*  --  25* 17  CREATININE 0.87 0.84 0.83 0.72  CALCIUM 8.5  --  9.0 8.9   Estimated Creatinine Clearance: 107.6 ml/min (by C-G formula based on Cr of 0.72).   LIVER  Recent Labs Lab 07/27/14 0755  AST 17  ALT 29  ALKPHOS 135*  BILITOT 0.7  PROT 7.3  ALBUMIN 3.0*     INFECTIOUS No results found for this basename: LATICACIDVEN, PROCALCITON,  in the last 168 hours   ENDOCRINE CBG (last 3)   Recent Labs  08/01/14 0001 08/01/14 0413 08/01/14 0739  GLUCAP 177* 210* 233*         IMAGING x48h No results found.   No results found.   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  C/b COPD, volume overload, deconditioning, pulm HTN.  Failed extubation 9/23 with stridor requiring immediate reintubation. Now tol ATC at all times, #6 cuffed shiley.   REC -  Cont BD  Hold off on change to cuffless trach for now with increased secretions and ?aspiration (not supported with cxr) Cont PMV as tol  Pulm hygiene   Keep dry as tolerated Cont abx per primary for extensive sinus disease (Augmentin)  Also recommend keeping more on the dry side to control secretion. Continue feeding via panda  PRN CXR   Chronic diastolic CHF  Volume Overload   REC -  Increase dose lasix 10/5 to 40 BID for 4 doses, then back to 20 QD F/u chem  F/u BNP      Canary BrimBrandi Ollis, NP-C [facility] Pgr:  [phone] or [phone] 08/01/2014, 9:39 AM  STAFF MD note  - having increased resp clear secretions. He is miserable annd upset as a result along with his health issues. Agree with NP Rx of lasix. Will add scopalamine patch 07/31/14. Also apparently his PMV not working well; have contacted Dr Molli KnockYacoub who did his tracheostomy. Rest per NP   Dr. Kalman ShanMurali Deberah Adolf, M.D., [facility] HospitalF.C.C.P Pulmonary and Critical Care Medicine Staff Physician [facility] Pager: [phone], If no answer or between  15:00h - 7:00h: call [phone]  08/01/2014 10:51 AM

## 2014-08-01 NOTE — Progress Notes (Signed)
Inpatient Diabetes Program Recommendations  AACE/ADA: New Consensus Statement on Inpatient Glycemic Control (2013)  Target Ranges:  Prepandial:   less than 140 mg/dL      Peak postprandial:   less than 180 mg/dL (1-2 hours)      Critically ill patients:  140 - 180 mg/dL   Noted increase in lantus: if using to cover tube feeds, lantus could pose a problem if the tube feeds were interrupted or discontinued as the half-life of lantus is 24 hrs. Would be safer to add tube feed coverage per below and decrease correction per below.   Inpatient Diabetes Program Recommendations Correction (SSI): If adding tube feed coverage q 4 hrs, please start with 4-6 units q 4 hrs and decrease corecction to the sensitive scale q 4 hrs. Insulin - Meal Coverage: tube feed coverage for Vital 1.2 at 80 ml/hr containes approx 14 grams per hour or 60 grams per 4 hrs. Recommend coveriing the tube feeds q 4 jrs with 60 grams per 4 hrs. Using a 1 unit per 10 grams carbohydrate, pt would need 5-6 units q 4 hrs  to cover tube feeds only. Thank you, Lenor CoffinAnn Braylynn Lewing, RN, CNS, Diabetes Coordinator [phone])*

## 2014-08-01 NOTE — Progress Notes (Signed)
Physical Therapy Session Note  Patient Details  Name: Richard [name] MRN: [id number] Date of Birth: 1960-11-08  Today's Date: 08/01/2014 PT Individual Time: 1500-1600 PT Individual Time Calculation (min): 60 min   Short Term Goals: Week 1:  PT Short Term Goal 1 (Week 1): Pt will perform all aspects of bed mobility at min A level with HOB flat and without rails.  PT Short Term Goal 2 (Week 1): Pt will perform stand pivot transfers at S level PT Short Term Goal 3 (Week 1): Pt will ambulate x 50' w/ LRAD at S level with SaO2 >90%.    Skilled Therapeutic Interventions/Progress Updates:  1:1. Pt received sitting in recliner, initially declining participation in therapy writing, "I just don't feel like I am up to it today." Pt agreeable with min encouragement and education regarding role/benefits of participation in therapy. Focus this session on transfers, toileting, activity tolerance and functional endurance.   Pt req supervision for all transfers this session BSC<>recliner<>w/c with therapist managing lines. Pt gestured need to use bathroom both at start and end of session, despite attempt to perform pericare pt req total A for management of gown and pericare.   Pt with fair tolerance to w/c propulsion 100'x1 and 75'x1 with supervision-min A, demonstrating very slow pace overall. Pt with good tolerance to amb 150'x1 and 100'x1 w/ RW and min guard-min Ax1 person, +2 present for management of w/c as well as lines. Pt cued for decreased pace and erect posture for improved functional endurance overall. Pt able to safely negotiate up/down 5 steps w/ B rail utilizing step-to pattern with min Ax1person, +2 present to manage lines.   Pt req seated rest throughout session due to fatigue as well as consistent management of secretions, but able to consistently maintain SaO2 >90% on 4L at 31%. Pt left sitting in recliner at end of session w/ all needs in reach.   Therapy Documentation Precautions:   Precautions Precautions: Fall Precaution Comments: h/o falls PTA per chart, trach and O2 Restrictions Weight Bearing Restrictions: No  See FIM for current functional status  Therapy/Group: Individual Therapy  [name], Richard [name] S 08/01/2014, 5:49 PM

## 2014-08-01 NOTE — Progress Notes (Signed)
Speech Language Pathology Daily Session Note  Patient Details  Name: Richard [name] MRN: [id number] Date of Birth: [birth year]  Today's Date: 08/01/2014 SLP Individual Time: 0800-0900 SLP Individual Time Calculation (min): 60 min  Short Term Goals: Week 1: SLP Short Term Goal 1 (Week 1): Patient will wear PMSV for 30 minutes with all vitals remaining WFL without evidence of breath stacking with supervision multimodal cues.  SLP Short Term Goal 2 (Week 1): Patient will demonstrate increased speech intelligibility at the word level to 75% intelligible with Min A multimodal cues SLP Short Term Goal 3 (Week 1): Pt will complete pharyngeal strengthening exercise to maximize swallowing function with Mod cues SLP Short Term Goal 4 (Week 1): Patient will recall new, daily information with use of external memory aids with Min A multimodal cues.  SLP Short Term Goal 5 (Week 1): Patient will utilize diaphragmatic breathing at the word level with Mod  A multimodal cues.   Skilled Therapeutic Interventions: Skilled treatment session focused on addressing recall and carryover of dysphagia exercises and tolerance of PMSV.   Upon arrival, patient was sitting upright in his recliner. Patient refused to allow SLP to don PMSV and reported via written expression, "I will try it when I am better." Provided extensive education in regards to function and clinical reasoning behind PMSV. Patient verbalized understanding and allowed PMSV to be donned, however, PMSV was instantly coughed off and patient refused further trials. Patient also refused deep suctioning from staff.  Patient required supervision cues to recall and Mod cues of encouragement to perform Masako Maneuver x10.  RN and PA made aware of patient's decreased ability to tolerant PMSV at this time.  Suspect difficulty is due to pharyngeal edema (as seen on FEES) with probable worsening due to multiple unsuccessful attempts to place NG tube.  Hopeful that PMSV  tolerance will improve as edema decreases. Continue with current plan of care.    FIM:  Comprehension Comprehension Mode: Auditory Comprehension: 4-Understands basic 75 - 89% of the time/requires cueing 10 - 24% of the time Expression Expression Mode: Verbal Expression: 2-Expresses basic 25 - 49% of the time/requires cueing 50 - 75% of the time. Uses single words/gestures. Social Interaction Social Interaction: 3-Interacts appropriately 50 - 74% of the time - May be physically or verbally inappropriate. Problem Solving Problem Solving: 3-Solves basic 50 - 74% of the time/requires cueing 25 - 49% of the time Memory Memory: 3-Recognizes or recalls 50 - 74% of the time/requires cueing 25 - 49% of the time  Pain Pain Assessment Pain Assessment: Faces Pain Score: 5  Faces Pain Scale: Hurts little more Pain Type: Acute pain Pain Location: Arm Pain Orientation: Right;Left Pain Descriptors / Indicators: Discomfort Pain Onset: On-going Pain Intervention(s): RN made aware  Therapy/Group: Individual Therapy  Tangi Shroff 08/01/2014, 12:03 PM

## 2014-08-01 NOTE — Progress Notes (Signed)
PHYSICAL MEDICINE & REHABILITATION     PROGRESS NOTE     Subjective/Complaints: No new issues. Secretions on shirt.  A  review of systems has been performed and if not noted above is otherwise negative.   Objective: Vital Signs: Blood pressure 116/71, pulse 83, temperature 98.8 F (37.1 C), temperature source Oral, resp. rate 18, height 5\' 2"  (1.575 m), weight 98.3 kg (216 lb 11.4 oz), SpO2 95.00%. No results found. No results found for this basename: WBC, HGB, HCT, PLT,  in the last 72 hours No results found for this basename: NA, K, CL, CO, GLUCOSE, BUN, CREATININE, CALCIUM,  in the last 72 hours CBG (last 3)   Recent Labs  08/01/14 0001 08/01/14 0413 08/01/14 0739  GLUCAP 177* 210* 233*    Wt Readings from Last 3 Encounters:  08/01/14 98.3 kg (216 lb 11.4 oz)  07/26/14 93 kg (205 lb 0.4 oz)  07/26/14 93 kg (205 lb 0.4 oz)    Physical Exam:  Constitutional:  No acute distress.  HENT: orlal mucosa pink/moist  Head: Normocephalic.  Eyes: EOM are normal.  Neck:  #6 cuffed trach in place---cuff deflated--decreased secretions  Cardiac rate controlled. No murmurs. Reg rhythm  Respiratory:  Ongoing scattered rhonchi and wet breath sounds, +  secretions, a few wheezes, occasional respiratory distress  GI: Soft. Bowel sounds are normal. He exhibits no distension.  Neurological: .  Patient is able to provide his name, age and date of birth. Follows simple commands. CN exam grossly intact. UE: 3+ delt, 4/5 bicep/tricep/HI. LE's: 3/5 HF, 3+KE, 4- ankles. No gross sensory changes. Good sitting balance.  Psychiatric:  Flat, frustrated. Appears more alert today Skin: a few scattered echhymoses on limbs still   Assessment/Plan: 1. Functional deficits secondary to severe deconditioning after respiratory failure which require 3+ hours per day of interdisciplinary therapy in a comprehensive inpatient rehab setting. Physiatrist is providing close team supervision and  24 hour management of active medical problems listed below. Physiatrist and rehab team continue to assess barriers to discharge/monitor patient progress toward functional and medical goals.  FIM: FIM - Bathing Bathing Steps Patient Completed: Chest;Right Arm;Left Arm;Abdomen;Front perineal area;Buttocks;Right upper leg;Left upper leg;Right lower leg (including foot);Left lower leg (including foot) Bathing: 4: Min-Patient completes 8-9 18f 10 parts or 75+ percent  FIM - Upper Body Dressing/Undressing Upper body dressing/undressing: 0: Wears gown/pajamas-no public clothing FIM - Lower Body Dressing/Undressing Lower body dressing/undressing: 0: Wears gown/pajamas-no public clothing  FIM - Toileting Toileting steps completed by patient: Performs perineal hygiene Toileting: 2: Max-Patient completed 1 of 3 steps  FIM - Diplomatic Services operational officer Devices: Psychiatrist Transfers: 4-To toilet/BSC: Min A (steadying Pt. > 75%);4-From toilet/BSC: Min A (steadying Pt. > 75%)  FIM - Bed/Chair Transfer Bed/Chair Transfer Assistive Devices: Arm rests Bed/Chair Transfer: 4: Bed > Chair or W/C: Min A (steadying Pt. > 75%);4: Chair or W/C > Bed: Min A (steadying Pt. > 75%)  FIM - Locomotion: Wheelchair Distance: 100 Locomotion: Wheelchair: 2: Travels 50 - 149 ft with supervision, cueing or coaxing FIM - Locomotion: Ambulation Locomotion: Ambulation Assistive Devices: Designer, industrial/product Ambulation/Gait Assistance: 4: Min assist;4: Min guard;1: +2 Total assist (+2 for management of w/c and lines) Locomotion: Ambulation: 0: Activity did not occur  Comprehension Comprehension Mode: Auditory Comprehension: 5-Understands complex 90% of the time/Cues < 10% of the time  Expression Expression Mode: Verbal Expression Assistive Devices: 6-Communication board Expression: 5-Expresses basic needs/ideas: With no assist  Social Interaction Social Interaction:  4-Interacts appropriately  75 - 89% of the time - Needs redirection for appropriate language or to initiate interaction.  Problem Solving Problem Solving: 5-Solves basic problems: With no assist  Memory Memory: 3-Recognizes or recalls 50 - 74% of the time/requires cueing 25 - 49% of the time  Medical Problem List and Plan:  1. Functional deficits secondary to debilitation after cardiac arrest/respiratory failure.  2. 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 Continue #6 cuffed trach for now given aspiration risk  -IS/FV  -pulmonary toilet  -continue augmentin  -secretions seem better in general  -afebrile    2. DVT Prophylaxis/Anticoagulation: Subcutaneous heparin. Monitor platelet counts and any signs of bleeding  3. Pain Management: Tylenol as needed  4.  Dysphagia: spoke with slp regarding swallow---dipslayed severe pharyngeal dysphagia due to sensorimotor dysfunction---NPO for now  -panda feeds continue  -have discussed g-tube with patient. i believe he's open to it 5. Neuropsych: This patient is capable of making decisions on his own behalf.  6. Skin/Wound Care: Routine skin care. Trach care as directed  7. Fluids/Electrolytes/Nutrition:  Following weights and I's and o's  8. CAD/CABG. Patient has refused cardiac catheterization in the past  9. Diastolic congestive heart failure. Continue Lasix as directed. Monitor for any signs of fluid overload  10. Diabetes mellitus with peripheral neuropathy.   -increased lantus to 50u qam and 50 u qpm with continued TF    11. Tobacco abuse. Provide counseling   12. Chronic renal insufficiency. Baseline creatinine 1.5-2.   13. Extensive sinus disease left mastoid. Continue Augmentin for time being        LOS (Days) 6 A FACE TO FACE EVALUATION WAS PERFORMED  SWARTZ,ZACHARY T 08/01/2014 8:03 AM

## 2014-08-01 NOTE — Consult Note (Signed)
NEUROBEHAVIORAL STATUS EXAM - [facility]   MEDICAL NECESSITY:  Richard [name] was seen on the [facility] for a neurobehavioral status exam owing to the patient's diagnosis of cardiac arrest, and to assist in treatment planning during admission.   According to medical records, Richard [name] was admitted to the rehab unit owing to "Functional deficits secondary to deconditioning after cardiac arrest and associated respiratory failure." He has a "multi-medical.history of diastolic congestive heart failure, tobacco abuse, coronary artery disease with CABG, COPD, type 2 diabetes mellitus, chronic renal insufficiency with baseline creatinine 1.5-2.0. Independent with a cane due to back pain prior to admission but has a complex social situation as his children are mentally disabled and being taken care of by other individuals at this time." He was reportedly admitted "to [facility] 07/11/2014 with generalized weakness findings of hyperkalemia and altered mental status as well white blood cell count 14,000 blood pressure elevated 199/114.While undergoing echocardiogram suddenly developed VF/VT arrest. CT angiogram chest negative for pulmonary emboli. Required intubation for respiratory failure with placement of percutaneous tracheostomy 07/21/2014 per critical care medicine. Patient refused cardiac catheterization to evaluate his coronary artery disease.   During today's visit, Richard [name] was unable to speak in any capacity but he could clearly comprehend me. He was able to nonverbally respond so much of the visit was modified (i.e., asking yes or no questions and him responding with head nodding). He expressed concern about new onset memory loss since the onset of his present medical situation. He described his current mood as "depressed", though he feels his depressive symptoms are in line with what might be expected given his  unfortunate situation. He is purportedly prescribed an antidepressant that he does not find to be effective. He has no history of mental health illness. No adjustment issues endorsed. No barriers to therapy identified. Suicidal/homicidal ideation, plan or intent was denied. No manic or hypomanic episodes were reported. The patient denied ever experiencing any auditory/visual hallucinations. No major behavioral or personality changes were endorsed. Richard [name] has adequate support and has enjoyed some of the rehab staff. He feels that he has made some progress in therapy.   PROCEDURES ADMINISTERED: [2 units [id number] on 08/01/14] Diagnostic clinical interview  Review of available records Mental Status Exam-2 (brief version)  MENTAL STATUS: Richard [name] modified mental status exam score of 11/13 was lower than expected but likely above the cutoff used to indicate severe cognitive impairment or dementia. He could immediately register 3 words into memory but only recalled 2 of them after a very short delay. He was fully oriented to person, place, time (except for date), and situation.   Behavioral Evaluation: Richard [name] was appropriately dressed for season and situation, and he appeared tidy. Normal posture was noted. Rapport was adequately established. He was unable to generate words via speech but could respond appropriately to yes or no questions nonverbally. He seemed to understand test directions readily. His affect was flat and he appeared depressed and tired. Attention and motivation were adequate. Optimal test taking conditions were maintained.   SUMMARY & IMPRESSION: Overall, Mr. Shall may be suffering from mild memory loss but his cognition is difficult to assess given significant language deficits. I suggest waiting one week and reassessing if his communication skills improve. Regardless, I would like to be checking in with the patient on a weekly basis in light of his depressive symptoms.     RECOMMENDATIONS    Since emotional factors are  adversely impacting the patient, consider checking to see if the patient might benefit from an increase in his antidepressant dose. Brief counseling for social support seems warranted during this hospitalization.    Attempt a repeat mental status exam next Monday. Please place him on my schedule.   DIAGNOSES:  Cardiac arrest Adjustment disorder with depressed mood   Debbe MountsAdam T. Marlee Trentman, Psy.D.  Clinical Neuropsychologist  Rehabilitation psychologist

## 2014-08-01 NOTE — Care Management Note (Signed)
Inpatient Rehabilitation Center Individual Statement of Services  Patient Name:  Richard LoosenJimmie G [name]  Date:  08/01/2014  Welcome to the Inpatient Rehabilitation Center.  Our goal is to provide you with an individualized program based on your diagnosis and situation, designed to meet your specific needs.  With this comprehensive rehabilitation program, you will be expected to participate in at least 3 hours of rehabilitation therapies Monday-Friday, with modified therapy programming on the weekends.  Your rehabilitation program will include the following services:  Physical Therapy (PT), Occupational Therapy (OT), Speech Therapy (ST), 24 hour per day rehabilitation nursing, Therapeutic Recreaction (TR), Neuropsychology, Case Management (Social Worker), Rehabilitation Medicine, Nutrition Services and Pharmacy Services  Weekly team conferences will be held on Tuesdays to discuss your progress.  Your Social Worker will talk with you frequently to get your input and to update you on team discussions.  Team conferences with you and your family in attendance may also be held.  Expected length of stay: 10-14 days  Overall anticipated outcome: supervision  Depending on your progress and recovery, your program may change. Your Social Worker will coordinate services and will keep you informed of any changes. Your Social Worker's name and contact numbers are listed  below.  The following services may also be recommended but are not provided by the Inpatient Rehabilitation Center:   Driving Evaluations  Home Health Rehabiltiation Services  Outpatient Rehabilitation Services    Arrangements will be made to provide these services after discharge if needed.  Arrangements include referral to agencies that provide these services.  Your insurance has been verified to be:  Medicaid Your primary doctor is:  [place]. [place]  Pertinent information will be shared with your doctor and your insurance  company.  Social Worker:  MariettaLucy Beryle Bags[place], [place] [phone] or [phone]) [phone]   Information discussed with and copy given to patient by: Amada JupiterHOYLE, Jasiel Apachito, 08/01/2014, 3:17 PM

## 2014-08-02 ENCOUNTER — Inpatient Hospital Stay (HOSPITAL_COMMUNITY): Payer: Medicaid Other | Admitting: Speech Pathology

## 2014-08-02 ENCOUNTER — Encounter (HOSPITAL_COMMUNITY): Payer: Medicaid Other

## 2014-08-02 ENCOUNTER — Inpatient Hospital Stay (HOSPITAL_COMMUNITY): Payer: Medicaid Other

## 2014-08-02 DIAGNOSIS — I5033 Acute on chronic diastolic (congestive) heart failure: Secondary | ICD-10-CM

## 2014-08-02 DIAGNOSIS — R5381 Other malaise: Secondary | ICD-10-CM

## 2014-08-02 DIAGNOSIS — F341 Dysthymic disorder: Secondary | ICD-10-CM

## 2014-08-02 LAB — GLUCOSE, CAPILLARY
GLUCOSE-CAPILLARY: 188 mg/dL — AB (ref 70–99)
GLUCOSE-CAPILLARY: 189 mg/dL — AB (ref 70–99)
GLUCOSE-CAPILLARY: 238 mg/dL — AB (ref 70–99)
Glucose-Capillary: 196 mg/dL — ABNORMAL HIGH (ref 70–99)
Glucose-Capillary: 206 mg/dL — ABNORMAL HIGH (ref 70–99)
Glucose-Capillary: 238 mg/dL — ABNORMAL HIGH (ref 70–99)

## 2014-08-02 LAB — PRO B NATRIURETIC PEPTIDE: Pro B Natriuretic peptide (BNP): 471.5 pg/mL — ABNORMAL HIGH (ref 0–125)

## 2014-08-02 MED ORDER — INSULIN GLARGINE 100 UNIT/ML ~~LOC~~ SOLN
55.0000 [IU] | Freq: Every day | SUBCUTANEOUS | Status: DC
  Filled 2014-08-02: qty 0.55

## 2014-08-02 MED ORDER — INSULIN GLARGINE 100 UNIT/ML ~~LOC~~ SOLN
55.0000 [IU] | Freq: Every day | SUBCUTANEOUS | Status: DC
  Administered 2014-08-02: 55 [IU] via SUBCUTANEOUS
  Filled 2014-08-02 (×3): qty 0.55

## 2014-08-02 MED ORDER — PANTOPRAZOLE SODIUM 40 MG PO PACK
40.0000 mg | PACK | Freq: Every day | ORAL | Status: DC
  Administered 2014-08-05 – 2014-08-22 (×18): 40 mg
  Filled 2014-08-02 (×21): qty 20

## 2014-08-02 MED ORDER — ESCITALOPRAM OXALATE 10 MG PO TABS
10.0000 mg | ORAL_TABLET | Freq: Every day | ORAL | Status: DC
  Administered 2014-08-02 – 2014-08-22 (×19): 10 mg
  Filled 2014-08-02 (×23): qty 1

## 2014-08-02 NOTE — Patient Care Conference (Signed)
Inpatient RehabilitationTeam Conference and Plan of Care Update Date: 08/02/2014   Time: 2:50 PM    Patient Name: Richard LoosenJimmie G Raver      Medical Record Number: [id number]  Date of Birth: 19-Nov-1960 Sex: Male         Room/Bed: 4W16C/4W16C-01 Payor Info: Payor: MEDICAID [place] / Plan: MEDICAID [place] ACCESS / Product Type: *No Product type* /    Admitting Diagnosis: Deconditioned after cardiac arrest  resp failure   Admit Date/Time:  07/26/2014  6:56 PM Admission Comments: No comment available   Primary Diagnosis:  <principal problem not specified> Principal Problem: <principal problem not specified>  Patient Active Problem List   Diagnosis Date Noted  . Tracheitis 07/28/2014  . Tracheostomy status 07/28/2014  . Debilitated 07/26/2014  . Acute on chronic kidney failure 07/11/2014  . Hyperkalemia 07/11/2014  . CHF (congestive heart failure) 07/11/2014  . DM (diabetes mellitus) 07/11/2014  . CAD (coronary artery disease) of artery bypass graft 07/11/2014  . HTN (hypertension) 07/11/2014  . COPD (chronic obstructive pulmonary disease) 07/11/2014  . GERD (gastroesophageal reflux disease) 07/11/2014  . Acute encephalopathy 07/11/2014  . CAP (community acquired pneumonia) 07/11/2014  . Cardiac arrest 07/11/2014    Expected Discharge Date: Expected Discharge Date:  (SNF)  Team Members Present: Physician leading conference: Dr. Faith RogueZachary Swartz Social Worker Present: Amada JupiterLucy Gennell How, LCSW Nurse Present: Keturah BarreEd Knisley, RN PT Present: Cyndia SkeetersBridgett Ripa, Scot JunPT;Caroline [name], PT OT Present: Ardis Rowanom Lanier, COTA;Jennifer Fredrich RomansSmith, OT;Kayla Perkinson, OT SLP Present: Feliberto Gottronourtney Payne, SLP PPS Coordinator present : Tora DuckMarie Noel, RN, CRRN     Current Status/Progress Goal Weekly Team Focus  Medical   severe deconditioning, aspirating, now NPO  improve functional abilities.nutrition maximization  respiratory mgt, dysphagia rx   Bowel/Bladder   continent of bowel and bladder  remain continent of bowel and bladder with  min assist  education regarding s/s of constiipation   Swallow/Nutrition/ Hydration   NPO/ vital 1.2 at 5085ml/hr via nasogastric tube  supervision with least resttrictive  pharyngeal strengthening exercises   ADL's   min A bathing; currently only wearing hospital gowns; BSC trannsfers - steady A/supervision  supervision overall  activity tolerance, functional transfers, safety awareness   Mobility   Close(S)-min guard A, significanlty decreased activity tolerance, frequent rest breaks due to fatigue and secretion managment  Supervision overall   activity tolerance, functional endurance, safety awarenes during functional transfers and mobility, strength, balance   Communication   Total A for verbal expression, declining trials of PMSV, utilizes written expression   Supervision  trials and tolerance of PMSV   Safety/Cognition/ Behavioral Observations  Min-Mod A  Supervision  recall and carryover of new information   Pain   denies pain  pain less than 3/10  assess pain q shift and prn   Skin   skin tear R leg  no further skin injury  assess dkin q shift and prn      *See Care Plan and progress notes for long and short-term goals.  Barriers to Discharge: swallowing, respiratory status    Possible Resolutions to Barriers:  peg tube, trach mgt and adjustment    Discharge Planning/Teaching Needs:  plan at this time is for pt to return home with nephew, however, family reports nephew is mentally challenged - concern with ability to assist  ongoing   Team Discussion:  ST reports on pt not tolerating PMSV.  He cannot tolerate for now but, overall, secretions somewhat decreased.  Will likely need peg.  Hope to reach supervision - min assist  overall goals, however, do not feel family can meet care needs at home.  SW reports nephew (primary caregiver) is mentally challenged per family and may not be able to manage medical care needs with peg and possible trach.  Recommend SNF unless family can  be trained.  Revisions to Treatment Plan:  None   Continued Need for Acute Rehabilitation Level of Care: The patient requires daily medical management by a physician with specialized training in physical medicine and rehabilitation for the following conditions: Daily direction of a multidisciplinary physical rehabilitation program to ensure safe treatment while eliciting the highest outcome that is of practical value to the patient.: Yes Daily medical management of patient stability for increased activity during participation in an intensive rehabilitation regime.: Yes Daily analysis of laboratory values and/or radiology reports with any subsequent need for medication adjustment of medical intervention for : Neurological problems;Post surgical problems  Kayslee Furey 08/02/2014, 3:23 PM

## 2014-08-02 NOTE — Progress Notes (Signed)
RT inflated patient's trach and d/c passy-muir per MD until further notice. RN made aware. No complications. Vital signs stable at this time. RT will continue to monitor.

## 2014-08-02 NOTE — Progress Notes (Signed)
Speech Language Pathology Daily Session Note  Patient Details  Name: Richard [name] MRN: [id number] Date of Birth: [birth year]  Today's Date: 08/02/2014 SLP Individual Time: [id number] SLP Individual Time Calculation (min): 10 min  Short Term Goals: Week 1: SLP Short Term Goal 1 (Week 1): Patient will wear PMSV for 30 minutes with all vitals remaining WFL without evidence of breath stacking with supervision multimodal cues.  SLP Short Term Goal 2 (Week 1): Patient will demonstrate increased speech intelligibility at the word level to 75% intelligible with Min A multimodal cues SLP Short Term Goal 3 (Week 1): Pt will complete pharyngeal strengthening exercise to maximize swallowing function with Mod cues SLP Short Term Goal 4 (Week 1): Patient will recall new, daily information with use of external memory aids with Min A multimodal cues.  SLP Short Term Goal 5 (Week 1): Patient will utilize diaphragmatic breathing at the word level with Mod  A multimodal cues.   Skilled Therapeutic Interventions: Skilled treatment session focused on speech goals in regards to donning and tolerating the PMSV. Upon arrival, patient was sitting upright in the wheelchair but was adamantly declining to wear PMSV despite Max encouragement and education from this clinician. Patient reported via written expression, "I will try when this comes out (NG tube)" and "maybe a smaller size would help (trach)."  This clinician continued to educated patient in regards to relationship between respiratory status and swallowing function, however, patient continued to decline, therefore, patient missed remaining 20 minutes of session.    FIM:  Comprehension Comprehension Mode: Auditory Comprehension: 5-Understands complex 90% of the time/Cues < 10% of the time Expression Expression Mode: Verbal Expression Assistive Devices: 6-Communication board Expression: 5-Expresses basic 90% of the time/requires cueing < 10% of the  time. Social Interaction Social Interaction: 4-Interacts appropriately 75 - 89% of the time - Needs redirection for appropriate language or to initiate interaction. Problem Solving Problem Solving: 5-Solves basic problems: With no assist Memory Memory: 3-Recognizes or recalls 50 - 74% of the time/requires cueing 25 - 49% of the time  Pain Pain Assessment Pain Assessment: No/denies pain  Therapy/Group: Individual Therapy  Keiara Sneeringer 08/02/2014, 10:12 AM

## 2014-08-02 NOTE — Progress Notes (Signed)
[place] PHYSICAL MEDICINE & REHABILITATION     PROGRESS NOTE     Subjective/Complaints: Fair night. Secretions continue to be decreased  A  review of systems has been performed and if not noted above is otherwise negative.   Objective: Vital Signs: Blood pressure 122/60, pulse 84, temperature 98.9 F (37.2 C), temperature source Oral, resp. rate 18, height 5\' 2"  (1.575 m), weight 91.173 kg (201 lb), SpO2 97.00%. No results found. No results found for this basename: WBC, HGB, HCT, PLT,  in the last 72 hours No results found for this basename: NA, K, CL, CO, GLUCOSE, BUN, CREATININE, CALCIUM,  in the last 72 hours CBG (last 3)   Recent Labs  08/01/14 2018 08/02/14 0017 08/02/14 0419  GLUCAP 180* 206* 238*    Wt Readings from Last 3 Encounters:  08/02/14 91.173 kg (201 lb)  07/26/14 93 kg (205 lb 0.4 oz)  07/26/14 93 kg (205 lb 0.4 oz)    Physical Exam:  Constitutional:  No acute distress.  HENT: orlal mucosa pink/moist  Head: Normocephalic.  Eyes: EOM are normal.  Neck:  #6 cuffed trach in place---cuff deflated--decreased secretions  Cardiac rate controlled. No murmurs. Reg rhythm  Respiratory:  No distress. No audible breath sounds. Still with rhonchi on exam.  GI: Soft. Bowel sounds are normal. He exhibits no distension.  Neurological: .  Patient is able to provide his name, age and date of birth. Follows simple commands. CN exam grossly intact. UE: 3+ delt, 4/5 bicep/tricep/HI. LE's: 3/5 HF, 3+KE, 4- ankles. No gross sensory changes. Good sitting balance.  Psychiatric:  Flat. Appears alert Skin: a few scattered echhymoses on limbs still   Assessment/Plan: 1. Functional deficits secondary to severe deconditioning after respiratory failure which require 3+ hours per day of interdisciplinary therapy in a comprehensive inpatient rehab setting. Physiatrist is providing close team supervision and 24 hour management of active medical problems listed  below. Physiatrist and rehab team continue to assess barriers to discharge/monitor patient progress toward functional and medical goals.  FIM: FIM - Bathing Bathing Steps Patient Completed: Chest;Right Arm;Left Arm;Abdomen;Front perineal area;Buttocks;Right upper leg;Left upper leg Bathing: 4: Min-Patient completes 8-9 63f 10 parts or 75+ percent  FIM - Upper Body Dressing/Undressing Upper body dressing/undressing: 0: Wears gown/pajamas-no public clothing FIM - Lower Body Dressing/Undressing Lower body dressing/undressing: 0: Wears gown/pajamas-no public clothing  FIM - Toileting Toileting steps completed by patient: Performs perineal hygiene Toileting Assistive Devices: Grab bar or rail for support Toileting: 1: Total-Patient completed zero steps, helper did all 3  FIM - Diplomatic Services operational officer Devices: Psychiatrist Transfers: 5-To toilet/BSC: Supervision (verbal cues/safety issues);5-From toilet/BSC: Supervision (verbal cues/safety issues)  FIM - Press photographer Assistive Devices: Arm rests Bed/Chair Transfer: 5: Bed > Chair or W/C: Supervision (verbal cues/safety issues);5: Chair or W/C > Bed: Supervision (verbal cues/safety issues)  FIM - Locomotion: Wheelchair Distance: 100 Locomotion: Wheelchair: 2: Travels 50 - 149 ft with minimal assistance (Pt.>75%) FIM - Locomotion: Ambulation Locomotion: Ambulation Assistive Devices: Designer, industrial/product Ambulation/Gait Assistance: 4: Min assist;4: Min guard;1: +2 Total assist (+2 for w/c follow and management of lines) Locomotion: Ambulation: 1: Two helpers  Comprehension Comprehension Mode: Auditory Comprehension: 5-Understands complex 90% of the time/Cues < 10% of the time  Expression Expression Mode: Verbal Expression Assistive Devices: 6-Communication board Expression: 5-Expresses basic needs/ideas: With no assist  Social Interaction Social Interaction: 4-Interacts  appropriately 75 - 89% of the time - Needs redirection for appropriate language or to initiate  interaction.  Problem Solving Problem Solving: 5-Solves basic problems: With no assist  Memory Memory: 3-Recognizes or recalls 50 - 74% of the time/requires cueing 25 - 49% of the time  Medical Problem List and Plan:  1. Functional deficits secondary to debilitation after cardiac arrest/respiratory failure.  2. 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   -decrease to #4 cuffed trach today  -IS/FV  -pulmonary toilet  -continue augmentin  -secretions seem better in general  -afebrile    2. DVT Prophylaxis/Anticoagulation: Subcutaneous heparin. Monitor platelet counts and any signs of bleeding  3. Pain Management: Tylenol as needed  4.  Dysphagia: spoke with slp regarding swallow---dipslayed severe pharyngeal dysphagia due to sensorimotor dysfunction---NPO for now  -panda feeds continue  -have discussed g-tube with patient. i believe he's open to it  -will review with team/SLP today 5. Neuropsych: This patient is capable of making decisions on his own behalf.   -appreciate neuropsych eval/recs---agree--initiate lexapro qhs 6. Skin/Wound Care: Routine skin care. Trach care as directed  7. Fluids/Electrolytes/Nutrition:  Following weights and I's and o's  8. CAD/CABG. Patient has refused cardiac catheterization in the past  9. Diastolic congestive heart failure. Continue Lasix as directed. Monitor for any signs of fluid overload  10. Diabetes mellitus with peripheral neuropathy.   -increased lantus to 55u qam and 55 u qpm with continued TF    11. Tobacco abuse. Provide counseling   12. Chronic renal insufficiency. Baseline creatinine 1.5-2.   13. Extensive sinus disease left mastoid. Continue Augmentin for time being        LOS (Days) 7 A FACE TO FACE EVALUATION WAS PERFORMED  SWARTZ,ZACHARY T 08/02/2014 7:58 AM

## 2014-08-02 NOTE — Progress Notes (Signed)
Physical Therapy Session Note  Patient Details  Name: Richard [name] MRN: [id number] Date of Birth: [birth year]  Today's Date: 08/02/2014 PT Individual Time: 1630-1700 PT Individual Time Calculation (min): 30 min   Short Term Goals: Week 1:  PT Short Term Goal 1 (Week 1): Pt will perform all aspects of bed mobility at min A level with HOB flat and without rails.  PT Short Term Goal 2 (Week 1): Pt will perform stand pivot transfers at S level PT Short Term Goal 3 (Week 1): Pt will ambulate x 50' w/ LRAD at S level with SaO2 >90%.    Skilled Therapeutic Interventions/Progress Updates:  1:1. Pt received sitting in recliner, pulse ox alerting therapist that SaO2 at 83% and noted that O2 tubing not attached to trach collar. Once tubing in place, SaO2 resolved to >90% on 8L 35%. Focus this session on functional endurance. Pt req supervision for SPT recliner>w/c. With supervision, pt req significantly increased time to propel w/c 135' w/ B UE, SaO2 >94% on 4L at 31%. Once in therapy gym, attempted to set up pt on NuStep, however, pt gesturing need to use bathroom. Pt transported back to room, req steadying assist for SPT w/c>BSC>recliner with supervision. Steadying A for clothing management and pericare. Increased time required during session for management of lines as well as secretions. Pt left sitting in recliner at end of session w/ all needs in reach.    Therapy Documentation Precautions:  Precautions Precautions: Fall Precaution Comments: h/o falls PTA per chart, trach and O2 Restrictions Weight Bearing Restrictions: No Vital Signs: Therapy Vitals Pulse Rate: 76 Resp: 18 Patient Position (if appropriate): Sitting Oxygen Therapy SpO2: 83 % O2 Device:  (Pt received sitting in room with trach tubing not attached to collar, RN made aware) O2 Flow Rate (L/min): 8 L/min FiO2 (%): 35 %  See FIM for current functional status  Therapy/Group: Individual Therapy  [name], Aashi Derrington  S 08/02/2014, 5:23 PM

## 2014-08-02 NOTE — Progress Notes (Signed)
Occupational Therapy Session Note  Patient Details  Name: Richard LoosenJimmie G [name] MRN: [id number] Date of Birth: 07-Jul-1961  Today's Date: 08/02/2014 OT Individual Time: 1000-1100 OT Individual Time Calculation (min): 60 min    Short Term Goals: Week 1:  OT Short Term Goal 1 (Week 1): LB Dressing: Min assist to include sit and stand OT Short Term Goal 2 (Week 1): Toileting: Supervision for 3/3 toilet tasks to include sit and stand OT Short Term Goal 3 (Week 1): Toilet transfer: steady assist with LRAD and standard toilet height OT Short Term Goal 4 (Week 1): Groom:  Stand at sink for grooming tasks OT Short Term Goal 5 (Week 1): Simple HM tasks:  Min assist with LRAD to gather clothes prior to B/Dsg session and/or assist with clean up after B/Dsg  Skilled Therapeutic Interventions/Progress Updates:    Pt engaged in bathing tasks with sit<>stand from recliner requiring assistance for bathing feet and donning TED hose. Pt stood to complete bathing tasks. Pt continues to decline wearing clothing.  Pt does not tolerate PMV at this time and requires assistance to mange secretions when coughed up. Pt requested to use BSC X 2 and performed transfers with supervision requiring assistance to manage IV, feeding tube, and O2 tubing.  Pt requires mod verbal cues to actively participate in therapy.  Focus on activity tolerance, dynamic standing balance, transfers, and safety awareness.  Therapy Documentation Precautions:  Precautions Precautions: Fall Precaution Comments: h/o falls PTA per chart, trach and O2 Restrictions Weight Bearing Restrictions: No  Pain: Pain Assessment Pain Assessment: Faces Pain Score: 2  Pain Type: Acute pain Pain Location: Arm Pain Orientation: Right;Left Pain Descriptors / Indicators: Discomfort Pain Onset: On-going Pain Intervention(s): RN made aware  See FIM for current functional status  Therapy/Group: Individual Therapy  Richard BraveLanier, Richard [name] 08/02/2014, 11:03  AM

## 2014-08-02 NOTE — Plan of Care (Signed)
Problem: RH Bed Mobility Goal: LTG Patient will perform bed mobility with assist (PT) LTG: Patient will perform bed mobility with assistance, with/without cues (PT).  Downgraded due to cognition and cues for safety.    Problem: RH Bed to Chair Transfers Goal: LTG Patient will perform bed/chair transfers w/assist (PT) LTG: Patient will perform bed/chair transfers with assistance, with/without cues (PT).  Downgraded due to cognition and cues for safety.    Problem: RH Furniture Transfers Goal: LTG Patient will perform furniture transfers w/assist (OT/PT LTG: Patient will perform furniture transfers with assistance (OT/PT).  Downgraded due to cognition and cues for safety.    Problem: RH Ambulation Goal: LTG Patient will ambulate in controlled environment (PT) LTG: Patient will ambulate in a controlled environment, # of feet with assistance (PT).  Distance upgraded due to improved endurance, but level of assist downgraded due to cognition and cues for safety.   Goal: LTG Patient will ambulate in home environment (PT) LTG: Patient will ambulate in home environment, # of feet with assistance (PT).  Distance upgraded due to improved endurance, but level of assist downgraded due to cognition and cues for safety.    Problem: RH Wheelchair Mobility Goal: LTG Patient will propel w/c in controlled environment (PT) LTG: Patient will propel wheelchair in controlled environment, # of feet with assist (PT)  Downgraded due to cognition and cues for safety.   Goal: LTG Patient will propel w/c in home environment (PT) LTG: Patient will propel wheelchair in home environment, # of feet with assistance (PT).  Outcome: Not Applicable Date Met:  46/56/81 Goal d/c due to improved functional endurance.

## 2014-08-02 NOTE — Progress Notes (Signed)
Physical Therapy Session Note  Patient Details  Name: Richard [name] MRN: [id number] Date of Birth: 1961-10-14  Today's Date: 08/02/2014 PT Individual Time: 0800-0900 PT Individual Time Calculation (min): 60 min   Short Term Goals: Week 1:  PT Short Term Goal 1 (Week 1): Pt will perform all aspects of bed mobility at min A level with HOB flat and without rails.  PT Short Term Goal 2 (Week 1): Pt will perform stand pivot transfers at S level PT Short Term Goal 3 (Week 1): Pt will ambulate x 50' w/ LRAD at S level with SaO2 >90%.    Skilled Therapeutic Interventions/Progress Updates:  1:1. Pt received sitting in recliner lightly sleeping, easy to wake and agreeable to therapy. Focus this session on functional endurance as well as safety during transfers and mobility. Pt req supervision for all transfers sit<>stand and SPT bed<>w/c without use of AD.   Attempted amb w/ B UE assist for increased use of B LE, however, pt with poor tolerance 10'x1 with min A due to B hip pain hips. Pt with good tolerance to ambulation 140'x1 w/ RW and min guard A, req +2 for w/c follow and management of lines. Pt continues to demonstrate overall flexed posture and req consistent cues to "look up" as well as amb more slowly for increased functional endurance.  Pt engaged in finding 10 horseshoes in gym to target ambulation, functional endurance and balance. Pt with good tolerance to standing 5min x2 in order to find 5 horseshoes each time. Pt req min A overall for balance during task with min cues for safety during reaching and obstacle negotiation.   Pt req frequent seated rest throughout session due to fatigue as well as assistance to manage secretions. Pt utilized gestures throughout session for communication. Pt able to maintain SaO2 >95% on 4L at 31% throughout session. Pt left sitting in recliner at end of session w/ all needs in reach and RN in room.   Therapy Documentation Precautions:   Precautions Precautions: Fall Precaution Comments: h/o falls PTA per chart, trach and O2 Restrictions Weight Bearing Restrictions: No Vital Signs: Therapy Vitals Pulse Rate: 80 Resp: 16 Patient Position (if appropriate): Sitting Oxygen Therapy SpO2: 95 % O2 Device: Trach collar O2 Flow Rate (L/min): 4 L/min FiO2 (%): 31 % Pulse Oximetry Type: Intermittent Pain: Pain Assessment Pain Assessment: Faces Pain Score: 2  Pain Type: Acute pain Pain Location: Arm Pain Orientation: Right;Left Pain Descriptors / Indicators: Discomfort Pain Onset: On-going Pain Intervention(s): RN made aware  See FIM for current functional status  Therapy/Group: Individual Therapy  [name], Tywanda Rice S 08/02/2014, 12:08 PM

## 2014-08-02 NOTE — Progress Notes (Signed)
Name: Richard [name] MRN: [id number] DOB: 04/05/1961    ADMISSION DATE:  07/26/2014 CONSULTATION DATE:  07/28/14  REFERRING MD :  Hermelinda MedicusSchwartz   CHIEF COMPLAINT:  Trach mgmt   BRIEF PATIENT DESCRIPTION: 53 y/o male with hx dCHF, COPD, DM, CKD [facility] with AMS, hyperkalemia and developed VF/VT arrest.  Course was c/b failed extubation requiring immediate re-intubation (now s/p trach), CAD for which he refused cath, volume overload, and significant dysphagia.  [facility] 9/29 for further rehab efforts.  He cont to have difficulties with dysphagia, increased secretions and low grade fevers and PCCM asked to re-eval.   SIGNIFICANT EVENTS  10/6- secretion increased  STUDIES:  FEES 10/1>> severe dysphagia with silent aspiration   10/6 sputum>>    SUBJECTIVE:  Pt NAD, PMV out. Reports not feeling any better. Staff reports of continued secretion issue IE copious and yellow tinted.   VITAL SIGNS: Temp:  [97.9 F (36.6 C)-99 F (37.2 C)] 98.9 F (37.2 C) (10/06 0527) Pulse Rate:  [84-92] 84 (10/06 0527) Resp:  [18-20] 18 (10/06 0527) BP: (111-148)/(55-60) 122/60 mmHg (10/06 0527) SpO2:  [91 %-98 %] 96 % (10/06 0853) FiO2 (%):  [35 %] 35 % (10/06 0853) Weight:  [201 lb (91.173 kg)] 201 lb (91.173 kg) (10/06 0527)  PHYSICAL EXAMINATION: General:  Chronically ill appearing male, NAD. Sitting in chair. PMV off Neuro:  Alert and interactive, MAE HEENT:  [place]/AT, PERRLA, EOM-I and MMM.  Trach site clean, increased secretion. Yellow tinted tracheal secretions noted. Cardiovascular:  RRR, Nl S1/S2, -M/R/G. Lungs:  Coarse BS diffusely. Diminished in bases Abdomen:  Soft, NT, ND and +BS. Musculoskeletal: 2-3+ pitting edema, generalized and -tenderness. Skin:  Intact.   PULMONARY No results found for this basename: PHART, PCO2, PCO2ART, PO2, PO2ART, HCO3, TCO2, O2SAT,  in the last 168 hours  CBC  Recent Labs Lab  07/26/14 1948 07/27/14 0755 07/29/14 0520  HGB 12.1* 12.4* 12.0*  HCT 38.1* 39.3 38.6*  WBC 12.2* 8.7 9.6  PLT 370 366 PLATELET CLUMPS NOTED ON SMEAR, COUNT APPEARS ADEQUATE    COAGULATION No results found for this basename: INR,  in the last 168 hours  CARDIAC  No results found for this basename: TROPONINI,  in the last 168 hours  Recent Labs Lab 07/29/14 0520  PROBNP 285.6*     CHEMISTRY  Recent Labs Lab 07/26/14 1948 07/27/14 0755 07/29/14 0520  NA  --  141 142  K  --  3.6* 4.4  CL  --  100 102  CO2  --  28 28  GLUCOSE  --  145* 79  BUN  --  25* 17  CREATININE 0.84 0.83 0.72  CALCIUM  --  9.0 8.9   Estimated Creatinine Clearance: 103.3 ml/min (by C-G formula based on Cr of 0.72).   LIVER  Recent Labs Lab 07/27/14 0755  AST 17  ALT 29  ALKPHOS 135*  BILITOT 0.7  PROT 7.3  ALBUMIN 3.0*     INFECTIOUS No results found for this basename: LATICACIDVEN, PROCALCITON,  in the last 168 hours   ENDOCRINE CBG (last 3)   Recent Labs  08/02/14 0017 08/02/14 0419 08/02/14 0822  GLUCAP 206* 238* 188*         IMAGING x48h No results found.   No results found.  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  Failed extubation 9/23 with stridor requiring immediate reintubation. Now tol ATC at all times, #6 cuffed shiley.   REC -  Cont BD  Hold off on change to cuffless trach for now with increased secretions and ?aspiration (not supported with cxr).  No downsize until secretion status reoslved Would consider cuff up to avoid mucous  / PNA Hold off PMV with secretions increased  Pulm hygiene   Keep dry as tolerated(no i/o being kept but weights support diuresis. Cont abx per primary for extensive sinus disease  (Augmentin)  Also recommend keeping more on the dry side to control secretion. Continue feeding via panda  Would obtain pcxr now  Chronic diastolic CHF  Volume Overload   REC -  Increase dose lasix 10/5 to 40 BID for 4 doses, then back to 20 QD F/u chem  F/u BNP   Discussion: I suspect he has post nasal drainage from sinus disease. Don't think trach is the issue. Continue with sinus disease treatment. Monitor i/o daily weights and check lytes. Check sputum culture. No downsize, re inflate cough, dc pmv   Brett Canales Minor ACNP [facility] Pager [phone] till 3 pm If no answer page [phone] 08/02/2014, 9:33 AM   I have fully examined this patient and agree with above findings.    And edited infull  Mcarthur Rossetti. Tyson Alias, MD, FACP Pgr: [phone] [place]

## 2014-08-03 ENCOUNTER — Inpatient Hospital Stay (HOSPITAL_COMMUNITY): Payer: Medicaid Other

## 2014-08-03 ENCOUNTER — Inpatient Hospital Stay (HOSPITAL_COMMUNITY): Payer: Medicaid Other | Admitting: Rehabilitation

## 2014-08-03 ENCOUNTER — Inpatient Hospital Stay (HOSPITAL_COMMUNITY): Payer: Medicaid Other | Admitting: Speech Pathology

## 2014-08-03 ENCOUNTER — Encounter (HOSPITAL_COMMUNITY): Payer: Medicaid Other | Admitting: Occupational Therapy

## 2014-08-03 LAB — BASIC METABOLIC PANEL
Anion gap: 12 (ref 5–15)
BUN: 15 mg/dL (ref 6–23)
CHLORIDE: 95 meq/L — AB (ref 96–112)
CO2: 32 mEq/L (ref 19–32)
Calcium: 8.4 mg/dL (ref 8.4–10.5)
Creatinine, Ser: 0.67 mg/dL (ref 0.50–1.35)
GFR calc Af Amer: >90 mL/min (ref >90)
GFR calc non Af Amer: >90 mL/min (ref >90)
GLUCOSE: 192 mg/dL — AB (ref 70–99)
POTASSIUM: 3.4 meq/L — AB (ref 3.7–5.3)
SODIUM: 139 meq/L (ref 137–147)

## 2014-08-03 LAB — GLUCOSE, CAPILLARY
GLUCOSE-CAPILLARY: 206 mg/dL — AB (ref 70–99)
GLUCOSE-CAPILLARY: 96 mg/dL (ref 70–99)
Glucose-Capillary: 114 mg/dL — ABNORMAL HIGH (ref 70–99)
Glucose-Capillary: 148 mg/dL — ABNORMAL HIGH (ref 70–99)
Glucose-Capillary: 244 mg/dL — ABNORMAL HIGH (ref 70–99)
Glucose-Capillary: 86 mg/dL (ref 70–99)

## 2014-08-03 LAB — CBC
HEMATOCRIT: 34.6 % — AB (ref 39.0–52.0)
HEMOGLOBIN: 11.2 g/dL — AB (ref 13.0–17.0)
MCH: 28.1 pg (ref 26.0–34.0)
MCHC: 32.4 g/dL (ref 30.0–36.0)
MCV: 86.9 fL (ref 78.0–100.0)
Platelets: 362 10*3/uL (ref 150–400)
RBC: 3.98 MIL/uL — ABNORMAL LOW (ref 4.22–5.81)
RDW: 16.4 % — ABNORMAL HIGH (ref 11.5–15.5)
WBC: 9.5 10*3/uL (ref 4.0–10.5)

## 2014-08-03 LAB — MAGNESIUM: Magnesium: 1.3 mg/dL — ABNORMAL LOW (ref 1.5–2.5)

## 2014-08-03 LAB — PHOSPHORUS: PHOSPHORUS: 3.1 mg/dL (ref 2.3–4.6)

## 2014-08-03 MED ORDER — INSULIN GLARGINE 100 UNIT/ML ~~LOC~~ SOLN
55.0000 [IU] | Freq: Every day | SUBCUTANEOUS | Status: DC
  Filled 2014-08-03: qty 0.55

## 2014-08-03 MED ORDER — KCL IN DEXTROSE-NACL 20-5-0.45 MEQ/L-%-% IV SOLN
INTRAVENOUS | Status: DC
  Administered 2014-08-03: 09:00:00 via INTRAVENOUS
  Filled 2014-08-03 (×4): qty 1000

## 2014-08-03 NOTE — Progress Notes (Signed)
The skilled treatment note has been reviewed and SLP is in agreement.  Elmar Antigua, M.A., CCC-SLP  [phone]   

## 2014-08-03 NOTE — Progress Notes (Addendum)
[facility] PHYSICAL MEDICINE & REHABILITATION     PROGRESS NOTE     Subjective/Complaints: Richard [name] "coughed out" overnight. Patient up sitting in chair. No new complaints otherwise. Low grad temp noted.  A  review of systems has been performed and if not noted above is otherwise negative.   Objective: Vital Signs: Blood pressure 140/60, pulse 71, temperature 99.7 F (37.6 C), temperature source Oral, resp. rate 18, height 5\' 2"  (1.575 m), weight 96.616 kg (213 lb), SpO2 91.00%. No results found.  Recent Labs  08/03/14 0555  WBC 9.5  HGB 11.2*  HCT 34.6*  PLT 362    Recent Labs  08/03/14 0555  NA 139  K 3.4*  CL 95*  GLUCOSE 192*  BUN 15  CREATININE 0.67  CALCIUM 8.4   CBG (last 3)   Recent Labs  08/03/14 0007 08/03/14 0414 08/03/14 0739  GLUCAP 244* 206* 148*    Wt Readings from Last 3 Encounters:  08/03/14 96.616 kg (213 lb)  07/26/14 93 kg (205 lb 0.4 oz)  07/26/14 93 kg (205 lb 0.4 oz)    Physical Exam:  Constitutional:  No acute distress.  HENT: orlal mucosa pink/moist  Head: Normocephalic.  Eyes: EOM are normal.  Neck:  #6 cuffed trach in place---cuff deflated--decreased secretions  Cardiac rate controlled. No murmurs. Reg rhythm  Respiratory:  No distress. No audible breath sounds. Still with rhonchi on exam.  GI: Soft. Bowel sounds are normal. He exhibits no distension.  Neurological: .  Patient is able to provide his name, age and date of birth. Follows simple commands. CN exam grossly intact. UE: 3+ delt, 4/5 bicep/tricep/HI. LE's: 3/5 HF, 3+KE, 4- ankles. No gross sensory changes. Good sitting balance.  Psychiatric:  Flat. Appears alert Skin: a few scattered echhymoses on limbs still   Assessment/Plan: 1. Functional deficits secondary to severe deconditioning after respiratory failure which require 3+ hours per day of interdisciplinary therapy in a comprehensive inpatient rehab setting. Physiatrist is providing close team supervision  and 24 hour management of active medical problems listed below. Physiatrist and rehab team continue to assess barriers to discharge/monitor patient progress toward functional and medical goals.  FIM: FIM - Bathing Bathing Steps Patient Completed: Chest;Right Arm;Left Arm;Abdomen;Front perineal area;Buttocks;Right upper leg;Left upper leg Bathing: 4: Min-Patient completes 8-9 20f 10 parts or 75+ percent  FIM - Upper Body Dressing/Undressing Upper body dressing/undressing: 0: Wears gown/pajamas-no public clothing FIM - Lower Body Dressing/Undressing Lower body dressing/undressing: 0: Wears gown/pajamas-no public clothing  FIM - Toileting Toileting steps completed by patient: Performs perineal hygiene;Adjust clothing prior to toileting;Adjust clothing after toileting (gown) Toileting Assistive Devices: Grab bar or rail for support Toileting: 4: Steadying assist  FIM - Diplomatic Services operational officer Devices: Bedside commode Toilet Transfers: 5-To toilet/BSC: Supervision (verbal cues/safety issues);5-From toilet/BSC: Supervision (verbal cues/safety issues)  FIM - Press photographer Assistive Devices: Arm rests Bed/Chair Transfer: 5: Bed > Chair or W/C: Supervision (verbal cues/safety issues);5: Chair or W/C > Bed: Supervision (verbal cues/safety issues)  FIM - Locomotion: Wheelchair Distance: 100 Locomotion: Wheelchair: 2: Travels 50 - 149 ft with supervision, cueing or coaxing FIM - Locomotion: Ambulation Locomotion: Ambulation Assistive Devices: Designer, industrial/product Ambulation/Gait Assistance: 4: Min assist;4: Min guard;1: +2 Total assist (+2 to manage lines) Locomotion: Ambulation: 1: Two helpers  Comprehension Comprehension Mode: Auditory Comprehension: 5-Understands complex 90% of the time/Cues < 10% of the time  Expression Expression Mode: Verbal Expression Assistive Devices: 6-Communication board Expression: 5-Expresses basic 90% of the  time/requires  cueing < 10% of the time.  Social Interaction Social Interaction: 4-Interacts appropriately 75 - 89% of the time - Needs redirection for appropriate language or to initiate interaction.  Problem Solving Problem Solving: 5-Solves basic problems: With no assist  Memory Memory: 3-Recognizes or recalls 50 - 74% of the time/requires cueing 25 - 49% of the time  Medical Problem List and Plan:  1. Functional deficits secondary to debilitation after cardiac arrest/respiratory failure.  2. 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   -continue #6 cuffed trach for now with cuff up per CCS  -IS/FV  -pulmonary toilet  -continue augmentin  -secretions persistent but better in general  -afebrile    2. DVT Prophylaxis/Anticoagulation: Subcutaneous heparin. Monitor platelet counts and any signs of bleeding  3. Pain Management: Tylenol as needed  4.  Severe Oral-pharyngeal --continue NPO  -after long discussion with patient, he's agreed to G-tube, will ask INR to eval  -Richard [name] out, IVF for now. 5. Neuropsych: This patient is capable of making decisions on his own behalf.   -appreciate neuropsych eval/recs---agree--initiate lexapro qhs 6. Skin/Wound Care: Routine skin care. Trach care as directed  7. Fluids/Electrolytes/Nutrition:  Following weights and I's and o's  8. CAD/CABG. Patient has refused cardiac catheterization in the past  9. Diastolic congestive heart failure. Continue Lasix as directed. Monitor for any signs of fluid overload  10. Diabetes mellitus with peripheral neuropathy.   -increased lantus to 55u qam and 55 u qpm with continued TF  -Hold today's am dose with NGT out    11. Tobacco abuse. Provide counseling   12. Chronic renal insufficiency. Baseline creatinine 1.5-2.   13. Extensive sinus disease left mastoid. Continue Augmentin for time being        LOS (Days) 8 A FACE TO FACE EVALUATION WAS PERFORMED  Richard [name] 08/03/2014 7:57 AM

## 2014-08-03 NOTE — Progress Notes (Signed)
Speech Language Pathology Daily Session Note  Patient Details  Name: Richard [name] MRN: [id number] Date of Birth: Feb 02, 1961  Today's Date: 08/03/2014 SLP Co-Treatment Time: 1030 (Co treat with PT (CK) 1000-1100)-1100 SLP Co-Treatment Time Calculation (min): 30 min  Short Term Goals: Week 1: SLP Short Term Goal 1 (Week 1): Patient will wear PMSV for 30 minutes with all vitals remaining WFL without evidence of breath stacking with supervision multimodal cues.  SLP Short Term Goal 2 (Week 1): Patient will demonstrate increased speech intelligibility at the word level to 75% intelligible with Min A multimodal cues SLP Short Term Goal 3 (Week 1): Pt will complete pharyngeal strengthening exercise to maximize swallowing function with Mod cues SLP Short Term Goal 4 (Week 1): Patient will recall new, daily information with use of external memory aids with Min A multimodal cues.  SLP Short Term Goal 5 (Week 1): Patient will utilize diaphragmatic breathing at the word level with Mod  A multimodal cues.   Skilled Therapeutic Interventions: Skilled co-treatment session with PT focused on cognitive-linguistic and speech goals. Upon arrival, patient was sitting upright in the chair but declining therapy by reporting via written expression that "none of these therapies work" and required Mod A multimodal cues for participation. Student facilitated session by providing supervision question cues for recall of information provided by physician regarding NG tube vs. PEG tube placement. Patient reported via written expression, "I coughed out the (NG) tube and do not want it back in my nose" and when asked about acceptance of PEG tube placement, "I will tomorrow." Student also facilitated session by providing extra time for problem solving and divided attention during a basic card game while standing. Patient demonstrated increased use of multimodal communication by independently requesting the use of the white  board for written expression. Continue with current plan of care.    FIM:  Comprehension Comprehension Mode: Auditory Comprehension: 5-Follows basic conversation/direction: With no assist Expression Expression Mode: Verbal Expression Assistive Devices: 6-Communication board Expression: 5-Expresses basic 90% of the time/requires cueing < 10% of the time. Social Interaction Social Interaction: 4-Interacts appropriately 75 - 89% of the time - Needs redirection for appropriate language or to initiate interaction. Problem Solving Problem Solving: 5-Solves basic 90% of the time/requires cueing < 10% of the time Memory Memory: 4-Recognizes or recalls 75 - 89% of the time/requires cueing 10 - 24% of the time  Pain Pain Assessment Pain Assessment: No/denies pain  Therapy/Group: Individual Therapy  Zeenat Jeanbaptiste 08/03/2014, 2:16 PM

## 2014-08-03 NOTE — Plan of Care (Signed)
Problem: RH Balance Goal: LTG Patient will maintain dynamic standing balance (PT) LTG: Patient will maintain dynamic standing balance with assistance during mobility activities (PT)  Downgraded due to decreased balance and cues for safety.

## 2014-08-03 NOTE — Plan of Care (Signed)
Problem: RH Swallowing Goal: LTG Patient will consume least restrictive PO diet (SLP) LTG: Patient will consume least restrictive PO diet with assist for use of compensatory strategies (SLP)  Outcome: Not Applicable Date Met:  07/37/10 Goal discharged at this time

## 2014-08-03 NOTE — Progress Notes (Signed)
IR PA aware of request for percutaneous gastrostomy tube placement. Will make NPO after midnight and hold tube feeds, hold sq heparin 10/8 and IR PA will see patient to discuss procedure.  Pattricia BossKoreen Sire Poet PA-C Interventional Radiology  08/03/14  3:15 PM

## 2014-08-03 NOTE — Progress Notes (Signed)
NUTRITION FOLLOW-UP  DOCUMENTATION CODES Per approved criteria  -Morbid Obesity   INTERVENTION: Once PEG tube is placed and stable for use: continue Vital AF 1.2 per tube @ goal rate of 85 ml/hr for 20 hours (in anticipation of therapy) with 200 ml free water every 6 hours.  Tube feeding regimen provides 2040 kcal, 127 grams of protein, and 1378 ml of H2O. Total free water: 2178 ml  Will continue to monitor.  NUTRITION DIAGNOSIS: Inadequate oral intake related to inability to eat as evidenced by NPO status; ongoing  Goal: Pt to meet >/= 90% of their estimated nutrition needs; not met  Monitor:  TF tolerance, weight trends, labs  53 y.o. male  Admitting Dx: Weakness  ASSESSMENT: 10/1-Pt admitted from [facility] for hyperkalemia and AMS. Pt has cardiac arrest while undergoing echocardiogram. Pt intubated and had difficulty with extubation. Pt had trach placed 9/24.  Pt failed swallow evaluation 10/1 and now plans to have feeding tube placed and to start enteral nutrition. No signs of fat or muscle depletion.   10/7- Panda tube came out ("coughed out") last night.  Panda tube was not replaced as pt is agreeable to PEG tube placement. TF has been stopped/discontinued for now. Prior to tube coming out RN reports pt has been tolerating his feedings well. Pt with continue NPO status. IR PA to see pt to discuss PEG tube placement procedure. Recommend continuing TF regimen once PEG tube is placed and stable for use.  Will continue to monitor.  Labs: Low potassium and chloride.  Height: Ht Readings from Last 1 Encounters:  07/26/14 $RemoveB'5\' 2"'OgJoMaUh$  (1.575 m)    Weight: Wt Readings from Last 1 Encounters:  08/03/14 213 lb (96.616 kg)    BMI:  Body mass index is 38.95 kg/(m^2).  Re-Estimated Nutritional Needs: Kcal: 2000-2200 Protein: 110-125 grams Fluid: > 2 L/day  Skin: +2 generalized edema, incision on right leg  Diet Order: NPO  No intake or output data in the 24 hours  ending 08/03/14 1042  Last BM: 10/6  Labs:   Recent Labs Lab 07/29/14 0520 08/03/14 0555  NA 142 139  K 4.4 3.4*  CL 102 95*  CO2 28 32  BUN 17 15  CREATININE 0.72 0.67  CALCIUM 8.9 8.4  MG  --  1.3*  PHOS  --  3.1  GLUCOSE 79 192*    CBG (last 3)   Recent Labs  08/03/14 0007 08/03/14 0414 08/03/14 0739  GLUCAP 244* 206* 148*    Scheduled Meds: . amoxicillin-clavulanate  1 tablet Per Tube Q12H  . antiseptic oral rinse  7 mL Mouth Rinse QID  . atorvastatin  80 mg Per Tube q1800  . chlorhexidine  15 mL Mouth Rinse BID  . escitalopram  10 mg Per Tube QHS  . free water  200 mL Per Tube 4 times per day  . furosemide  20 mg Per Tube Daily  . heparin  5,000 Units Subcutaneous 3 times per day  . insulin aspart  0-20 Units Subcutaneous 6 times per day  . insulin glargine  55 Units Subcutaneous QHS  . [START ON 08/04/2014] insulin glargine  55 Units Subcutaneous Daily  . pantoprazole sodium  40 mg Per Tube Daily  . scopolamine  1 patch Transdermal Q72H    Continuous Infusions: . sodium chloride 75 mL/hr (08/02/14 0605)  . dextrose 5 % and 0.45 % NaCl with KCl 20 mEq/L 50 mL/hr at 08/03/14 0900  . feeding supplement (VITAL AF 1.2 CAL) 1,000  mL (08/02/14 1422)    Past Medical History  Diagnosis Date  . CAD (coronary artery disease) of artery bypass graft 07/11/2014  . Cardiac arrest 07/11/2014  . CHF (congestive heart failure) 07/11/2014  . HTN (hypertension) 07/11/2014  . COPD (chronic obstructive pulmonary disease) 07/11/2014  . DM (diabetes mellitus) 07/11/2014  . GERD (gastroesophageal reflux disease) 07/11/2014  . Acute on chronic kidney failure 07/11/2014    No past surgical history on file.  Kallie Locks, MS, RD, Provisional LDN Pager # [phone] After hours/ weekend pager # [phone]

## 2014-08-03 NOTE — Plan of Care (Signed)
Problem: RH Swallowing Goal: LTG Patient will demonstrate a functional change in (SLP) LTG: Patient will demonstrate a functional change in oral/oropharyngeal swallow as evidenced by an objective assessment (SLP)  Outcome: Not Applicable Date Met:  12/50/87 Goal Discharged at this time

## 2014-08-03 NOTE — Progress Notes (Signed)
Physical Therapy Session Note  Patient Details  Name: Richard [name] MRN: [id number] Date of Birth: 1961/01/03  Today'[name] Date: 08/03/2014 PT Individual Time: 1300-1400 PT Individual Time Calculation (min): 60 min   Short Term Goals: Week 1:  PT Short Term Goal 1 (Week 1): Pt will perform all aspects of bed mobility at min A level with HOB flat and without rails.  PT Short Term Goal 2 (Week 1): Pt will perform stand pivot transfers at [name] level PT Short Term Goal 3 (Week 1): Pt will ambulate x 50' w/ LRAD at [name] level with SaO2 >90%.    Skilled Therapeutic Interventions/Progress Updates:  1:1. Pt received sitting in recliner, ready for therapy. Focus this session on functional endurance. Pt req supervision for toilet transfer and toileting at start of session with use of BSC. Pt req supervision for all t/f sit<>stand this session with RW, intermittent cues for hand placement.   Pt with good tolerance to ambulation 175'x2, 150'x1 and 50'x1 w/ RW, req close([name])-min guard A with therapist managing lines. Pt req cues for posture and decreased pace for increased functional endurance and overall safety. Pt demonstrates with very flexed posture and short quick steps.   Pt utilized NuStep for B UE/LE strength/endurance, level 4x7712min, with cues for pace maintained in 60s and increased stride for improved recruitment of gross musculature. Borg scale rating of 13.   Pt able to maintain SaO2 >95% on 4L at 31% throughout session. Pt req seated rest throughout session due to fatigue and management of secretions. Pt left sitting in recliner with all needs in reach.   Therapy Documentation Precautions:  Precautions Precautions: Fall Precaution Comments: h/o falls PTA per chart, trach and O2 Restrictions Weight Bearing Restrictions: No  See FIM for current functional status  Therapy/Group: Individual Therapy  [name], Richard [name] 08/03/2014, 4:18 PM

## 2014-08-03 NOTE — Progress Notes (Signed)
Note reviewed and accurately reflects treatment session.   

## 2014-08-03 NOTE — Progress Notes (Signed)
Physical Therapy Session Note  Patient Details  Name: Richard LoosenJimmie G [name] MRN: [id number] Date of Birth: [birth year]  Today'[name] Date: 08/03/2014 PT Co-Treatment Time: 1000-1030  (Co-tx with SLP (1030-1100) for 60min total) PT Co-Treatment Time Calculation (min): 30 min  Short Term Goals: Week 1:  PT Short Term Goal 1 (Week 1): Pt will perform all aspects of bed mobility at min A level with HOB flat and without rails.  PT Short Term Goal 2 (Week 1): Pt will perform stand pivot transfers at [name] level PT Short Term Goal 3 (Week 1): Pt will ambulate x 50' w/ LRAD at [name] level with SaO2 >90%.    Skilled Therapeutic Interventions/Progress Updates:  Pt received sitting in recliner, initially declining therapy due to neck/shoulder pain. Pt agreeable to participate with mod encouragement, education regarding role and benefit of therapies. Co-tx this session with SLP to target activity tolerance, functional endurance, communication and problem solving. See SLP note for additional details regarding discipline specific goals of tx session.   Pt req supervision for SPT w/c<>recliner as well as all t/f sit<>stand with RW and cues for hand placement. Pt propelling w/c 75'x2 w/ B UE to target. Pt with good tolerance to amb 150-165'x3 with RW and min guard A, +2 present for w/c follow and management of lines. Pt continues to req cues for posture and decreased pace for improved functional endurance and safety of standing mobility.   Pt with good tolerance to standing for 10min and 5min w/ variable UE support by RW to challenge balance while engaging in card game. Emphasis during card game to facilitate problem solving and non-verbal communication via communication board or gestures.   Pt able to maintain SaO2 >95% on 4L at 31% throughout session. Pt left sitting in recliner at end of session w/ all needs in reach.   Therapy Documentation Precautions:  Precautions Precautions: Fall Precaution Comments: h/o falls PTA  per chart, trach and O2 Restrictions Weight Bearing Restrictions: No   Vital Signs: Therapy Vitals Pulse Rate: 81 Resp: 18 Patient Position (if appropriate): Sitting Oxygen Therapy SpO2: 96 % O2 Device: Trach collar O2 Flow Rate (L/min): 4 L/min FiO2 (%): 31 %  See FIM for current functional status  Therapy/Group: Co-Treatment  Zerita BoersKing, Richard [name] 08/03/2014, 12:41 PM

## 2014-08-03 NOTE — Progress Notes (Signed)
Social Work Patient ID: [name], male   DOB: 1961/03/01, 53 y.o.   MRN: [id number]  Spoke with pt this morning to review team conference.  Attempted to explain to him that team is concerned about his probable care needs at d/c and whether he and nephew can truly manage at home on their own.  Offered possible option of SNF to which pt immediately shakes his head "no".  Again, tried to explain that his care needs will need to drastically decrease to be manageable at home.  Pt then writes, "It seems like it is going to be Christmas before I get our of here."  Offering emotional support and did not press him any further about d/c plans.   Pt does report that he understands he needs to have peg placement and is in agreement with this.  I will follow up with pt's family on current status and continue to discuss d/c plans.    Carlea Badour, LCSW

## 2014-08-03 NOTE — Progress Notes (Signed)
Speech Language Pathology Weekly Progress Note  Patient Details  Name: Richard [name] MRN: [id number] Date of Birth: 05-13-1961  Beginning of progress report period: July 27, 2014 End of progress report period: August 03, 2014    Short Term Goals: Week 1: SLP Short Term Goal 1 (Week 1): Patient will wear PMSV for 30 minutes with all vitals remaining WFL without evidence of breath stacking with supervision multimodal cues.  SLP Short Term Goal 1 - Progress (Week 1): Discontinued (comment) SLP Short Term Goal 2 (Week 1): Patient will demonstrate increased speech intelligibility at the word level to 75% intelligible with Min A multimodal cues SLP Short Term Goal 2 - Progress (Week 1): Discontinued (comment) SLP Short Term Goal 3 (Week 1): Pt will complete pharyngeal strengthening exercise to maximize swallowing function with Mod cues SLP Short Term Goal 3 - Progress (Week 1): Not met SLP Short Term Goal 4 (Week 1): Patient will recall new, daily information with use of external memory aids with Min A multimodal cues.  SLP Short Term Goal 4 - Progress (Week 1): Met SLP Short Term Goal 5 (Week 1): Patient will utilize diaphragmatic breathing at the word level with Mod  A multimodal cues.  SLP Short Term Goal 5 - Progress (Week 1): Discontinued (comment)    New Short Term Goals: Week 2: SLP Short Term Goal 1 (Week 2): Pt will complete pharyngeal strengthening exercise to maximize swallowing function with Mod cues SLP Short Term Goal 2 (Week 2): Patient will recall new, daily information with use of external memory aids with supervision multimodal cues.  SLP Short Term Goal 3 (Week 2): Patient will utilize multimodal communication to express wants/needs with Mod A multimodal cues.  SLP Short Term Goal 4 (Week 2): Patient will demonstrate functional problem solving for basic and familiar tasks with Min A multimodal cues.   Weekly Progress Updates: Patient has made minimal gains and has  met 1 of 6 STG's this admission due to a change in his medical status. At beginning of the reporting period, the patient demonstrated intermittent difficulty tolerating his PMSV and had a FEES on 07/28/14 which showed severe pharyngeal dysphagia and patient was made NPO. Since then, patient has demonstrated increased difficulty managing his secretions and decreased ability to tolerate the PMSV, therefore, the patient has a #6 cuffed trach with the cuff inflated at all times and all PMSV orders have been discharged at this time.  Patient continues to be NPO and is now scheduled for PEG placement on 08/04/14 and requires Mod-Max A multimodal cues to return demonstration of pharyngeal strengthening exercises. Patient has demonstrated increased recall of information but continues to require Mod-Max A multimodal cues for use of multimodal communication for wants/needs. Patient education ongoing and patient's current discharge plan is unknown at this time due to his current medical status. Patient would benefit from continued skilled SLP intervention to maximize functional communication with multimodal communication, cognitive function and swallowing function prior to discharge.    Intensity: Minumum of 1-2 x/day, 30 to 90 minutes Frequency: 5 out of 7 days Duration/Length of Stay: TBD due to SNF placment  Treatment/Interventions: Cueing hierarchy;Dysphagia/aspiration precaution training;Functional tasks;Speech/Language facilitation;Therapeutic Activities;Patient/family education;Cognitive remediation/compensation;Internal/external aids;Environmental controls            Perth Amboy, [place] 08/03/2014, 4:00 PM

## 2014-08-03 NOTE — Progress Notes (Signed)
Occupational Therapy Session Note  Patient Details  Name: Richard LoosenJimmie G Cloe MRN: [id number] Date of Birth: 10/12/1961  Today's Date: 08/03/2014 OT Individual Time: [id number] OT Individual Time Calculation (min): 60 min   Short Term Goals: Week 1:  OT Short Term Goal 1 (Week 1): LB Dressing: Min assist to include sit and stand OT Short Term Goal 2 (Week 1): Toileting: Supervision for 3/3 toilet tasks to include sit and stand OT Short Term Goal 3 (Week 1): Toilet transfer: steady assist with LRAD and standard toilet height OT Short Term Goal 4 (Week 1): Groom:  Stand at sink for grooming tasks OT Short Term Goal 5 (Week 1): Simple HM tasks:  Min assist with LRAD to gather clothes prior to B/Dsg session and/or assist with clean up after B/Dsg  Skilled Therapeutic Interventions/Progress Updates:  Pt seated in recliner when arriving. Pt reporting pain in neck, RN made aware. RN and RT checked on pt during session. Pt performed UB/LB B&D while seated in recliner using LH sponge, reacher, and sock-aid. Pt became frustrated during donning of sock w/ sock-aid and attempted to don sock on LLE which already had sock on it at one point. Pt instructed on sequencing of using sock-aid and was successful on 4th attempt. Pt educated and demonstrated 6 stretching exercises for neck to work on neck stiffness. Pt educated on UE HEP w/ 5 lb weight bar. Pt completed 4 exercises w/ 10 rep and was able to identify 2/4 strengthening exercises and 4/6 neck stretches at end of session. Pt re-educated on remaining 2/4 exercises and 2/6 stretching exercises. Pt seated in recliner w/ all needs nearby when leaving.  Therapy Documentation Precautions:  Precautions Precautions: Fall Precaution Comments: h/o falls PTA per chart, trach and O2 Restrictions Weight Bearing Restrictions: No  See FIM for current functional status  Therapy/Group: Individual Therapy  Charish Schroepfer Raynell 08/03/2014, 11:47 AM

## 2014-08-03 NOTE — Progress Notes (Signed)
Physical Therapy make up Session Note  Patient Details  Name: Richard [name] MRN: [id number] Date of Birth: [birth year]  Today's Date: 08/03/2014 PT Individual Time: 1535 (make up session)-1607 PT Individual Time Calculation (min): 32 min   Short Term Goals: Week 1:  PT Short Term Goal 1 (Week 1): Pt will perform all aspects of bed mobility at min A level with HOB flat and without rails.  PT Short Term Goal 2 (Week 1): Pt will perform stand pivot transfers at S level PT Short Term Goal 3 (Week 1): Pt will ambulate x 50' w/ LRAD at S level with SaO2 >90%.    Skilled Therapeutic Interventions/Progress Updates:   Pt received sitting in recliner in room, reluctantly agreeable to therapy session.  Pt stood at min A level in order to be weighed prior to session.  Max cues for safety, as he was impulsive when standing and stepping off of scale.  Skilled session focused on activity tolerance/generalized strengthening with w/c propulsion, gait (also for improved quality and safety), and seated nustep.  Performed TUG in hallway with use of RW in 38.07 secs at min/guard assist for safety, as he was very impulsive with gait initially.  This amount of time places pt at very high risk for having fall, as normative data suggests anything >13.5 secs in community dwelling adults is risk for falls.  Then performed gait x 108' x 1 and another 890' x 1 with RW at min/guard level (+2 assist for line management and chair follow for safety).  Pt continues to require mod cues for upright posture, slower gait speed with increased stride length.  Note that he is tolerating increased distance during sessions.  Ended session with seated nustep for overall strengthening and endurance x 7 mins at level 4 resistance with BUEs/LEs.  Assisted back to chair and back to room.  Transferred back to recliner and left with all needs in reach.  SaO2 remained in mid to high 90's throughout session on 28%, 3L.    Therapy  Documentation Precautions:  Precautions Precautions: Fall Precaution Comments: h/o falls PTA per chart, trach and O2 Restrictions Weight Bearing Restrictions: No   Vital Signs: Therapy Vitals Pulse Rate: 78 Resp: 20 Patient Position (if appropriate): Sitting Oxygen Therapy SpO2: 94 % O2 Device: Trach collar O2 Flow Rate (L/min): 5 L/min FiO2 (%): 28 % Pain: Pain Assessment Pain Assessment: No/denies pain   Locomotion : Ambulation Ambulation/Gait Assistance: 4: Min guard;1: +2 Total assist (+2 for management of lines)   See FIM for current functional status  Therapy/Group: Individual Therapy  Vista Deckarcell, Kelten Enochs Ann 08/03/2014, 3:58 PM

## 2014-08-03 NOTE — Plan of Care (Signed)
Problem: RH Expression Communication Goal: LTG Patient will increase speech intelligibility (SLP) LTG: Patient will increase speech intelligibility at word/phrase/conversation level with cues, % of the time (SLP)  Outcome: Not Applicable Date Met:  29/51/88 Goal Discharged at this time

## 2014-08-03 NOTE — Plan of Care (Signed)
Problem: RH Expression Communication Goal: LTG Patient will verbally express basic/complex needs (SLP) LTG: Patient will verbally express basic/complex needs, wants or ideas with cues (SLP)  Outcome: Not Applicable Date Met:  55/20/80 Goal Discharged at this time

## 2014-08-04 ENCOUNTER — Inpatient Hospital Stay (HOSPITAL_COMMUNITY): Payer: Medicaid Other

## 2014-08-04 ENCOUNTER — Inpatient Hospital Stay (HOSPITAL_COMMUNITY): Payer: Medicaid Other | Admitting: *Deleted

## 2014-08-04 ENCOUNTER — Encounter (HOSPITAL_COMMUNITY): Payer: Self-pay | Admitting: Radiology

## 2014-08-04 ENCOUNTER — Inpatient Hospital Stay (HOSPITAL_COMMUNITY): Payer: Medicaid Other | Admitting: Physical Therapy

## 2014-08-04 ENCOUNTER — Ambulatory Visit (HOSPITAL_COMMUNITY): Payer: Medicaid Other | Admitting: Occupational Therapy

## 2014-08-04 LAB — CULTURE, RESPIRATORY W GRAM STAIN: Special Requests: NORMAL

## 2014-08-04 LAB — CULTURE, RESPIRATORY: GRAM STAIN: NONE SEEN

## 2014-08-04 LAB — GLUCOSE, CAPILLARY
GLUCOSE-CAPILLARY: 88 mg/dL (ref 70–99)
GLUCOSE-CAPILLARY: 86 mg/dL (ref 70–99)
GLUCOSE-CAPILLARY: 110 mg/dL — AB (ref 70–99)
Glucose-Capillary: 87 mg/dL (ref 70–99)
Glucose-Capillary: 97 mg/dL (ref 70–99)

## 2014-08-04 MED ORDER — IOHEXOL 300 MG/ML  SOLN
50.0000 mL | Freq: Once | INTRAMUSCULAR | Status: AC | PRN
  Administered 2014-08-04: 20 mL via INTRAVENOUS

## 2014-08-04 MED ORDER — INSULIN GLARGINE 100 UNIT/ML ~~LOC~~ SOLN
55.0000 [IU] | Freq: Every day | SUBCUTANEOUS | Status: DC
  Administered 2014-08-06 – 2014-08-07 (×2): 55 [IU] via SUBCUTANEOUS
  Filled 2014-08-04 (×4): qty 0.55

## 2014-08-04 MED ORDER — CEFAZOLIN SODIUM-DEXTROSE 2-3 GM-% IV SOLR
INTRAVENOUS | Status: AC
  Administered 2014-08-04: 2 g via INTRAVENOUS
  Filled 2014-08-04: qty 50

## 2014-08-04 MED ORDER — LIDOCAINE HCL 1 % IJ SOLN
INTRAMUSCULAR | Status: AC
  Filled 2014-08-04: qty 20

## 2014-08-04 MED ORDER — FENTANYL CITRATE 0.05 MG/ML IJ SOLN
INTRAMUSCULAR | Status: AC
  Filled 2014-08-04: qty 2

## 2014-08-04 MED ORDER — GLUCAGON HCL RDNA (DIAGNOSTIC) 1 MG IJ SOLR
INTRAMUSCULAR | Status: AC
  Filled 2014-08-04: qty 1

## 2014-08-04 MED ORDER — MIDAZOLAM HCL 2 MG/2ML IJ SOLN
INTRAMUSCULAR | Status: AC
  Filled 2014-08-04: qty 2

## 2014-08-04 MED ORDER — CEFAZOLIN SODIUM-DEXTROSE 2-3 GM-% IV SOLR
2.0000 g | INTRAVENOUS | Status: AC
  Administered 2014-08-04: 2 g via INTRAVENOUS
  Filled 2014-08-04: qty 50

## 2014-08-04 MED ORDER — FENTANYL CITRATE 0.05 MG/ML IJ SOLN
INTRAMUSCULAR | Status: AC | PRN
  Administered 2014-08-04: 50 ug via INTRAVENOUS

## 2014-08-04 MED ORDER — MIDAZOLAM HCL 2 MG/2ML IJ SOLN
INTRAMUSCULAR | Status: AC | PRN
  Administered 2014-08-04: 1 mg via INTRAVENOUS

## 2014-08-04 MED ORDER — GLUCAGON HCL RDNA (DIAGNOSTIC) 1 MG IJ SOLR
INTRAMUSCULAR | Status: AC | PRN
  Administered 2014-08-04: 1 mg via INTRAVENOUS

## 2014-08-04 NOTE — Progress Notes (Signed)
[facility] PHYSICAL MEDICINE & REHABILITATION     PROGRESS NOTE     Subjective/Complaints: No new complaints. Had a reasonable night. Secretions consistent  A  review of systems has been performed and if not noted above is otherwise negative.   Objective: Vital Signs: Blood pressure 148/57, pulse 75, temperature 99.2 F (37.3 C), temperature source Oral, resp. rate 18, height 5\' 2"  (1.575 m), weight 85.4 kg (188 lb 4.4 oz), SpO2 93.00%. Dg Chest 2 View  08/03/2014   CLINICAL DATA:  Pneumonia, shortness of breath, tracheostomy  EXAM: CHEST  2 VIEW  COMPARISON:  Chest x-ray of 07/28/2014  FINDINGS: There is vague opacity at the lung bases most consistent with atelectasis. A patchy area of pneumonia cannot be excluded. There is blunting of the left costophrenic angle which may represent small left effusion. Cardiomegaly is stable and there may be minimal pulmonary vascular congestion. A tracheostomy remains. Median sternotomy sutures are noted from CABG.  IMPRESSION: Diminished aeration with basilar atelectasis. Cannot exclude pneumonia. Question mild congestion is well.   Electronically Signed   By: Dwyane DeePaul  Barry M.D.   On: 08/03/2014 08:16    Recent Labs  08/03/14 0555  WBC 9.5  HGB 11.2*  HCT 34.6*  PLT 362    Recent Labs  08/03/14 0555  NA 139  K 3.4*  CL 95*  GLUCOSE 192*  BUN 15  CREATININE 0.67  CALCIUM 8.4   CBG (last 3)   Recent Labs  08/04/14 0045 08/04/14 0406 08/04/14 0738  GLUCAP 87 88 86    Wt Readings from Last 3 Encounters:  08/04/14 85.4 kg (188 lb 4.4 oz)  07/26/14 93 kg (205 lb 0.4 oz)  07/26/14 93 kg (205 lb 0.4 oz)    Physical Exam:  Constitutional:  No acute distress.  HENT: orlal mucosa pink/moist  Head: Normocephalic.  Eyes: EOM are normal.  Neck:  #6 cuffed trach in place--secretions thru trach  Cardiac rate controlled. No murmurs. Reg rhythm  Respiratory:  No distress. No audible breath sounds.   rhonchi on exam.  GI: Soft. Bowel  sounds are normal. He exhibits no distension.  Neurological: .  Patient is able to provide his name, age and date of birth. Follows simple commands. CN exam grossly intact. UE: 3+ delt, 4/5 bicep/tricep/HI. LE's: 3/5 HF, 3+KE, 4- ankles. No gross sensory changes. Good sitting balance.  Psychiatric:  Flat. Appears alert Skin: a few scattered echhymoses on limbs still   Assessment/Plan: 1. Functional deficits secondary to severe deconditioning after respiratory failure which require 3+ hours per day of interdisciplinary therapy in a comprehensive inpatient rehab setting. Physiatrist is providing close team supervision and 24 hour management of active medical problems listed below. Physiatrist and rehab team continue to assess barriers to discharge/monitor patient progress toward functional and medical goals.  FIM: FIM - Bathing Bathing Steps Patient Completed: Chest;Right Arm;Left Arm;Abdomen;Front perineal area;Right upper leg;Left upper leg;Buttocks;Right lower leg (including foot);Left lower leg (including foot) Bathing: 4: Steadying assist  FIM - Upper Body Dressing/Undressing Upper body dressing/undressing: 0: Wears gown/pajamas-no public clothing FIM - Lower Body Dressing/Undressing Lower body dressing/undressing steps patient completed: Don/Doff right sock;Don/Doff left sock Lower body dressing/undressing: 4: Min-Patient completed 75 plus % of tasks (socks only)  FIM - Toileting Toileting steps completed by patient: Performs perineal hygiene;Adjust clothing prior to toileting;Adjust clothing after toileting Toileting Assistive Devices: Grab bar or rail for support Toileting: 5: Supervision: Safety issues/verbal cues  FIM - Diplomatic Services operational officerToilet Transfers Toilet Transfers Assistive Devices: Bedside commode  Toilet Transfers: 5-To toilet/BSC: Supervision (verbal cues/safety issues);5-From toilet/BSC: Supervision (verbal cues/safety issues)  FIM - Press photographer Assistive  Devices: Arm rests Bed/Chair Transfer: 5: Bed > Chair or W/C: Supervision (verbal cues/safety issues);5: Chair or W/C > Bed: Supervision (verbal cues/safety issues)  FIM - Locomotion: Wheelchair Distance: 100 Locomotion: Wheelchair: 2: Travels 50 - 149 ft with supervision, cueing or coaxing FIM - Locomotion: Ambulation Locomotion: Ambulation Assistive Devices: Designer, industrial/product Ambulation/Gait Assistance: 4: Min guard;1: +2 Total assist (for line management) Locomotion: Ambulation: 1: Two helpers  Comprehension Comprehension Mode: Auditory Comprehension: 5-Follows basic conversation/direction: With no assist  Expression Expression Mode: Verbal;Nonverbal Expression Assistive Devices: 6-Communication board Expression: 5-Expresses basic 90% of the time/requires cueing < 10% of the time.  Social Interaction Social Interaction: 4-Interacts appropriately 75 - 89% of the time - Needs redirection for appropriate language or to initiate interaction.  Problem Solving Problem Solving: 5-Solves basic 90% of the time/requires cueing < 10% of the time  Memory Memory: 4-Recognizes or recalls 75 - 89% of the time/requires cueing 10 - 24% of the time  Medical Problem List and Plan:  1. Functional deficits secondary to debilitation after cardiac arrest/respiratory failure.  2. 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   -continue #6 cuffed trach for now with cuff up per CCS  -IS/FV  -pulmonary toilet  -continue augmentin  -secretions persistent but better in general  -afebrile    2. DVT Prophylaxis/Anticoagulation: Subcutaneous heparin. Monitor platelet counts and any signs of bleeding  3. Pain Management: Tylenol as needed  4.  Severe Oral-pharyngeal --   -after long discussion with patient, he's agreed to G-tube   -G-tube placement today  -Panda out, IVF for now. 5. Neuropsych: This patient is capable of making decisions on his own behalf.   -appreciate neuropsych  eval/recs---agree--initiate lexapro qhs 6. Skin/Wound Care: Routine skin care. Trach care as directed  7. Fluids/Electrolytes/Nutrition:  Following weights and I's and o's  8. CAD/CABG. Patient has refused cardiac catheterization in the past  9. Diastolic congestive heart failure. Continue Lasix as directed. Monitor for any signs of fluid overload  10. Diabetes mellitus with peripheral neuropathy.   -holding lantus 55u qam and 55 u qpm given that he's only on IVF   11. Tobacco abuse. Provide counseling   12. Chronic renal insufficiency. Baseline creatinine 1.5-2.   13. Extensive sinus disease left mastoid. Continue Augmentin for time being        LOS (Days) 9 A FACE TO FACE EVALUATION WAS PERFORMED  SWARTZ,ZACHARY T 08/04/2014 8:19 AM

## 2014-08-04 NOTE — Procedures (Signed)
Successful fluoroscopic guided insertion of gastrostomy tube without immediate post procedural complicatoin.   The gastrostomy tube may be used immediately for medications.  Tube feeds may be initiated in 24 hours as per the primary team.   

## 2014-08-04 NOTE — Progress Notes (Signed)
Physical Therapy Note  Patient Details  Name: Cyril LoosenJimmie G Gattuso MRN: [id number] Date of Birth: 11-16-1960 Today's Date: 08/04/2014  Pt missing 45 minutes of skilled physical therapy at this time due to pt refusal. Educated pt on importance of continued participation in therapies maximize functional gains made thus far; however, pt repeatedly stated "No; not today," and pointed to abdomen.   Spoke with RN concerning missed therapy time. Per RN, pt has refused all medication since NG tube came out while pt was coughing. Pt currently awaiting PEG placement.  Calvert CantorHobble, Charonda Hefter A 08/04/2014, 10:09 AM

## 2014-08-04 NOTE — H&P (Signed)
Reason for Consult: Dysphagia  Referring Physician(s): Dr. Riley Kill  History of Present Illness: Richard [name] is a 53 y.o. male who presented with generalized weakness, AMS/respiratory failure s/p tracheostomy, hyperkalemia with VF/VT arrest, CTA PE negative. He refused a heart catheterization, echo done EF 65-70%,cardiology seen.  He is on dysphagia diet with increased secretions and IR received request for image guided percutaneous gastrostomy tube placement. The patient denies any chest pain or history of GI bleed or GI surgery.   Past Medical History  Diagnosis Date  . CAD (coronary artery disease) of artery bypass graft 07/11/2014  . Cardiac arrest 07/11/2014  . CHF (congestive heart failure) 07/11/2014  . HTN (hypertension) 07/11/2014  . COPD (chronic obstructive pulmonary disease) 07/11/2014  . DM (diabetes mellitus) 07/11/2014  . GERD (gastroesophageal reflux disease) 07/11/2014  . Acute on chronic kidney failure 07/11/2014    History reviewed. No pertinent past surgical history.  Allergies: Review of patient's allergies indicates no known allergies.  Medications: Prior to Admission medications   Medication Sig Start Date End Date Taking? Authorizing Provider  albuterol (PROVENTIL HFA;VENTOLIN HFA) 108 (90 BASE) MCG/ACT inhaler Inhale 1-2 puffs into the lungs every 6 (six) hours as needed for wheezing or shortness of breath.   Yes Historical Provider, MD  Amino Acids-Protein Hydrolys (FEEDING SUPPLEMENT, PRO-STAT SUGAR FREE 64,) LIQD Take 60 mLs by mouth 2 (two) times daily at 10 am and 4 pm. 07/26/14  Yes Simonne Martinet, NP  amoxicillin-clavulanate (AUGMENTIN) 875-125 MG per tablet Take 1 tablet by mouth every 12 (twelve) hours. 07/26/14  Yes Simonne Martinet, NP  antiseptic oral rinse (CPC / CETYLPYRIDINIUM CHLORIDE 0.05%) 0.05 % LIQD solution 7 mLs by Mouth Rinse route QID. 07/26/14  Yes Simonne Martinet, NP  atorvastatin (LIPITOR) 80 MG tablet Place 1 tablet (80 mg total) into  feeding tube daily at 6 PM. 07/26/14  Yes Simonne Martinet, NP  chlorhexidine (PERIDEX) 0.12 % solution 15 mLs by Mouth Rinse route 2 (two) times daily. 07/26/14  Yes Simonne Martinet, NP  furosemide (LASIX) 10 MG/ML injection Inject 2 mLs (20 mg total) into the vein daily. 07/26/14  Yes Simonne Martinet, NP  heparin 5000 UNIT/ML injection Inject 1 mL (5,000 Units total) into the skin every 8 (eight) hours. 07/26/14  Yes Simonne Martinet, NP  insulin aspart (NOVOLOG) 100 UNIT/ML injection Inject 0-20 Units into the skin every 4 (four) hours. 07/26/14  Yes Simonne Martinet, NP  insulin glargine (LANTUS) 100 UNIT/ML injection Inject 0.4 mLs (40 Units total) into the skin 2 (two) times daily. 07/26/14  Yes Simonne Martinet, NP  Maltodextrin-Xanthan Gum (RESOURCE THICKENUP CLEAR) POWD Thicken 07/26/14  Yes Simonne Martinet, NP  metoprolol succinate (TOPROL-XL) 50 MG 24 hr tablet Take 50 mg by mouth daily. Take with or immediately following a meal.   Yes Historical Provider, MD  pantoprazole (PROTONIX) 40 MG tablet Take 1 tablet (40 mg total) by mouth daily. 07/26/14  Yes Simonne Martinet, NP  tamsulosin (FLOMAX) 0.4 MG CAPS capsule Take 0.4 mg by mouth daily.   Yes Historical Provider, MD    Family History  Problem Relation Age of Onset  . Hypertension Sister   . Heart attack Father     History   Social History  . Marital Status: Single    Spouse Name: N/A    Number of Children: N/A  . Years of Education: N/A   Social History Main Topics  . Smoking status:  None  . Smokeless tobacco: None  . Alcohol Use: None  . Drug Use: None  . Sexual Activity: None   Other Topics Concern  . None   Social History Narrative  . None   Review of Systems: A 12 point ROS discussed and pertinent positives are indicated in the HPI above.  All other systems are negative.  Review of Systems  Vital Signs: BP 148/57  Pulse 75  Temp(Src) 99.2 F (37.3 C) (Oral)  Resp 18  Ht 5\' 2"  (1.575 m)  Wt 188 lb 4.4 oz (85.4  kg)  BMI 34.43 kg/m2  SpO2 93%  Physical Exam General: A&Ox3, NAD, tracheostomy intact Heart: RRR without M/G/R Lungs: Diminished B/L Abd: Soft, NT, ND, (+) BS  Imaging: Dg Chest 2 View  08/03/2014   CLINICAL DATA:  Pneumonia, shortness of breath, tracheostomy  EXAM: CHEST  2 VIEW  COMPARISON:  Chest x-ray of 07/28/2014  FINDINGS: There is vague opacity at the lung bases most consistent with atelectasis. A patchy area of pneumonia cannot be excluded. There is blunting of the left costophrenic angle which may represent small left effusion. Cardiomegaly is stable and there may be minimal pulmonary vascular congestion. A tracheostomy remains. Median sternotomy sutures are noted from CABG.  IMPRESSION: Diminished aeration with basilar atelectasis. Cannot exclude pneumonia. Question mild congestion is well.   Electronically Signed   By: Dwyane DeePaul  Barry M.D.   On: 08/03/2014 08:16   Dg Abd 1 View  07/29/2014   CLINICAL DATA:  Feeding tube placement.  EXAM: ABDOMEN - 1 VIEW  COMPARISON:  None.  FLUOROSCOPY TIME:  1 min 45 seconds.  FINDINGS: Feeding tube was placed under fluoroscopic guidance in the Radiology department, by a radiology technologist. The feeding tube tip is in the horizontal portion of the duodenum. Contrast was injected and position confirmed. A single image was taken. No complicating features.  IMPRESSION: Successful feeding tube placement with tip in the horizontal portion of the duodenum.   Electronically Signed   By: Leanna BattlesMelinda  Blietz M.D.   On: 07/29/2014 16:22   Ct Soft Tissue Neck W Contrast  07/20/2014   CLINICAL DATA:  Intubated.  Neck swelling.  EXAM: CT NECK WITH CONTRAST  TECHNIQUE: Multidetector CT imaging of the neck was performed using the standard protocol following the bolus administration of intravenous contrast.  CONTRAST:  100mL OMNIPAQUE IOHEXOL 350 MG/ML SOLN  COMPARISON:  None.  FINDINGS: The visualized portion of the brain is unremarkable. The major vascular structures  appear patent. The skullbase demonstrates bilateral mastoid effusions and pansinus disease. There is also fluid in the left middle ear cavity. Extensive mucoperiosteal thickening involving the maxillary, ethmoid and both half of the sphenoid sinus.  Endotracheal tube and NG tubes are noted. Both appear to be in good position without complicating features.  The tongue base and floor of the mouth are grossly normal. No mass or abscess is identified in the neck. The thyroid gland is normal. No neck adenopathy. The major vascular structures are patent. No superior mediastinal mass or adenopathy. The lung apices are grossly clear. The bony structures are grossly intact. Anterior cervical fusion hardware is noted.  IMPRESSION: No significant findings in the neck. The endotracheal tube and NG tubes appear normal.  No neck mass or lymphadenopathy.   Electronically Signed   By: Loralie ChampagneMark  Gallerani M.D.   On: 07/20/2014 18:53   Ct Angio Chest Pe W/cm &/or Wo Cm  07/20/2014   CLINICAL DATA:  53 year old male with respiratory failure  and cardiac arrest  EXAM: CT ANGIOGRAPHY CHEST WITH CONTRAST  TECHNIQUE: Multidetector CT imaging of the chest was performed using the standard protocol during bolus administration of intravenous contrast. Multiplanar CT image reconstructions and MIPs were obtained to evaluate the vascular anatomy.  CONTRAST:  OMNIPAQUE IOHEXOL 350 MG/ML SOLN  COMPARISON:  Plain film 07/20/2014, abdomen CT 06/09/2014  FINDINGS: Chest:  Endotracheal tube terminates above the carina. Gastric tube traverses the esophagus. Collateral draining veins on the right chest wall partially filled with contrast from the contrast bolus.  No axillary adenopathy. No supraclavicular adenopathy. Unremarkable appearance of the thyroid tissue.  Heart size borderline enlarged. Surgical changes of prior median sternotomy new and coronary bypass. Calcifications of the native coronary arteries.  Vascular:  No aneurysm of the thoracic  aorta.  No periaortic fluid.  No central, lobar, or segmental filling defects. The subsegmental arteries are not well evaluated secondary to attenuation of the contrast bolus and motion.  Mixed ground-glass and linear opacity throughout the lungs. Trace bilateral pleural fluid.  Abdomen:  Unremarkable appearance of the upper abdomen.  Review of the MIP images confirms the above findings.  IMPRESSION: Study is negative for pulmonary emboli, however, the distal subsegmental vessels are not well evaluated.  Mild edema and atelectatic changes of the lungs.  Surgical changes of prior median sternotomy and CABG.  Endotracheal tube terminates above the carina.  Signed,  Yvone Neu. Loreta Ave, DO  [facility]  [facility]   Electronically Signed   By: Gilmer Mor O.D.   On: 07/20/2014 18:54   Labs:  CBC:  Recent Labs  07/26/14 1948 07/27/14 0755 07/29/14 0520 08/03/14 0555  WBC 12.2* 8.7 9.6 9.5  HGB 12.1* 12.4* 12.0* 11.2*  HCT 38.1* 39.3 38.6* 34.6*  PLT 370 366 PLATELET CLUMPS NOTED ON SMEAR, COUNT APPEARS ADEQUATE 362    COAGS:  Recent Labs  07/11/14 0640 07/19/14 0409 07/21/14 1050  INR 1.10 1.30  --   APTT 38*  --  26    BMP:  Recent Labs  07/26/14 0428 07/26/14 1948 07/27/14 0755 07/29/14 0520 08/03/14 0555  NA 141  --  141 142 139  K 3.6*  --  3.6* 4.4 3.4*  CL 101  --  100 102 95*  CO2 28  --  28 28 32  GLUCOSE 183*  --  145* 79 192*  BUN 27*  --  25* 17 15  CALCIUM 8.5  --  9.0 8.9 8.4  CREATININE 0.87 0.84 0.83 0.72 0.67  GFRNONAA >90 >90 >90 >90 >90  GFRAA >90 >90 >90 >90 >90    LIVER FUNCTION TESTS:  Recent Labs  07/11/14 0915 07/12/14 0400 07/27/14 0755  BILITOT 0.3 0.3 0.7  AST 24 20 17   ALT 39 34 29  ALKPHOS 113 89 135*  PROT 6.5 5.9* 7.3  ALBUMIN 3.4* 3.0* 3.0*   Assessment and Plan: AMS respiratory failure s/p tracheostomy  Hyperkalemia with VF/VT arrest  Dysphagia Request for image guided  percutaneous gastrostomy tube placement with moderate sedaiton Patient has been NPO- tube feeds held, no thinners given, labs and imaging reviewed. Risks and Benefits discussed with the patient. All of the patient's questions were answered, patient is agreeable to proceed. Consent signed and in chart. Diastolic CHF, chronic COPD DM CKD CAD s/p CABG   Thank you for this interesting consult.  I greatly enjoyed meeting Selby G [place]ne and look forward to participating in their care.   I spent a total  of 60 minutes face to face in clinical consultation, greater than 50% of which was counseling/coordinating care  Signed: Berneta Levins 08/04/2014, 10:55 AM

## 2014-08-04 NOTE — Sedation Documentation (Signed)
Vent and obturator available if needed.

## 2014-08-04 NOTE — Progress Notes (Signed)
Physical Therapy Note  Patient Details  Name: Richard LoosenJimmie G [name] MRN: [id number] Date of Birth: 1961/05/14 Today's Date: 08/04/2014  Patient missed 30 minutes of skilled physical therapy this PM secondary to refusal to participate. Patient received sitting up in recliner, asleep, but easily aroused. Patient stated "Not today" when attempting to engage in therapy session. Patient points to abdomen and shakes head no. Will follow up as able.   Richard RicherBridget S Devaris [name], PT, DPT 08/04/2014, 4:29 PM

## 2014-08-04 NOTE — Progress Notes (Signed)
Occupational Therapy Session Note  Patient Details  Name: Richard [name] MRN: [id number] Date of Birth: 1961/07/22  Today's Date: 08/04/2014 OT Individual Time: 0815-0905 OT Individual Time Calculation (min): 50 min   Short Term Goals: Week 1:  OT Short Term Goal 1 (Week 1): LB Dressing: Min assist to include sit and stand OT Short Term Goal 2 (Week 1): Toileting: Supervision for 3/3 toilet tasks to include sit and stand OT Short Term Goal 3 (Week 1): Toilet transfer: steady assist with LRAD and standard toilet height OT Short Term Goal 4 (Week 1): Groom:  Stand at sink for grooming tasks OT Short Term Goal 5 (Week 1): Simple HM tasks:  Min assist with LRAD to gather clothes prior to B/Dsg session and/or assist with clean up after B/Dsg  Skilled Therapeutic Interventions/Progress Updates: ADL-retraining with focus on energy conservation, sit<>stand, adapted bathing and dressing skills, and AE use.    Pt received in his recliner, alert and oriented with min cues using schedule and communication board.   Pt reported need for additional assist at beginning of session via written message he produced on communication board.  Pt elected to bathe sitting/standing from recliner using over bed table, wash basin, and long sponge after setup by therapist.   Pt progressed through bath sequentially from head to feet with good thoroughness with only standby assist as he stood to wash his peri-area and buttocks.  No LOB noted while pt stood and pt was able to gesture for need of additional supplies.  After bathing, pt deferred dressing but elected to wash his hair using shampoo cap and trim his beard with portable trimmer provided.   Pt requested assist with facial hair d/t being unfamiliar with function of trimmer although attempting to trim his neck hair after assist for beard from therapist.   Pt combed his hair and reported satisfaction using "thumbs up" gesture with his effort and outcome of bathing and  grooming using large inspection mirror provided.    Therapy Documentation Precautions:  Precautions Precautions: Fall Precaution Comments: h/o falls PTA per chart, trach and O2 Restrictions Weight Bearing Restrictions: No  Vital Signs: Oxygen Therapy SpO2:  (Pt. bathing at time of visit pulse ox off, but pt. not in an) O2 Device: Trach collar FiO2 (%): 28 %  Pain: Pain Assessment Pain Assessment: No/denies pain  See FIM for current functional status  Therapy/Group: Individual Therapy  Yahaira Bruski 08/04/2014, 10:47 AM

## 2014-08-04 NOTE — Sedation Documentation (Signed)
RT suctioning pt prior to transfer.  VSS

## 2014-08-04 NOTE — Progress Notes (Signed)
Physical Therapy Note  Patient Details  Name: Richard LoosenJimmie G Boisclair MRN: [id number] Date of Birth: May 16, 1961 Today's Date: 08/04/2014 Attempted make-up session for missed minutes this week.  Pt sitting up in recliner, dozing.  He was easily aroused, but declined participating in PT.  PT suggested simple therapeutic ex he could perform in sitting in recliner, but pt refused. Conner Neiss 08/04/2014, 3:16 PM

## 2014-08-04 NOTE — Sedation Documentation (Signed)
Breathing less labored.  Pt more relaxed.

## 2014-08-04 NOTE — Progress Notes (Signed)
SLP Cancellation Note  Patient Details Name: Richard [name] MRN: [id number] DOB: 11-Oct-1961   Cancelled treatment:       Patient missed 60 minutes of skilled SLP intervention due to off unit for PEG placement                                                                                                Richard [name] 08/04/2014, 11:36 AM

## 2014-08-05 ENCOUNTER — Inpatient Hospital Stay (HOSPITAL_COMMUNITY): Payer: Medicaid Other | Admitting: Speech Pathology

## 2014-08-05 ENCOUNTER — Encounter (HOSPITAL_COMMUNITY): Payer: Medicaid Other

## 2014-08-05 ENCOUNTER — Inpatient Hospital Stay (HOSPITAL_COMMUNITY): Payer: Medicaid Other

## 2014-08-05 LAB — GLUCOSE, CAPILLARY
GLUCOSE-CAPILLARY: 135 mg/dL — AB (ref 70–99)
GLUCOSE-CAPILLARY: 161 mg/dL — AB (ref 70–99)
Glucose-Capillary: 101 mg/dL — ABNORMAL HIGH (ref 70–99)
Glucose-Capillary: 140 mg/dL — ABNORMAL HIGH (ref 70–99)
Glucose-Capillary: 90 mg/dL (ref 70–99)
Glucose-Capillary: 92 mg/dL (ref 70–99)
Glucose-Capillary: 93 mg/dL (ref 70–99)

## 2014-08-05 MED ORDER — INSULIN GLARGINE 100 UNIT/ML ~~LOC~~ SOLN
20.0000 [IU] | Freq: Once | SUBCUTANEOUS | Status: AC
  Administered 2014-08-05: 20 [IU] via SUBCUTANEOUS
  Filled 2014-08-05: qty 0.2

## 2014-08-05 NOTE — Progress Notes (Signed)
[facility] PHYSICAL MEDICINE & REHABILITATION     PROGRESS NOTE     Subjective/Complaints: Fair night. No new coughing and sob. Some abdominal discomfort  But tolerable  A  review of systems has been performed and if not noted above is otherwise negative.   Objective: Vital Signs: Blood pressure 128/66, pulse 68, temperature 99.1 F (37.3 C), temperature source Oral, resp. rate 18, height 5\' 2"  (1.575 m), weight 96.7 kg (213 lb 3 oz), SpO2 99.00%. Ir Gastrostomy Tube Mod Sed  08/04/2014   INDICATION: Generalized weakness, altered mental status, respiratory failure, post tracheostomy tube placement, now with dysphagia. Request made for percutaneous gastrostomy tube placement.  EXAM: PULL TROUGH GASTROSTOMY TUBE PLACEMENT  COMPARISON:  CT abdomen pelvis - 06/09/2014  MEDICATIONS: Patient is currently admitted to the hospital and receiving intravenous antibiotics. The antibiotics were administered within 1 hour of the procedure.  CONTRAST:  20 mL of Isovue 300 administered into the gastric lumen.  ANESTHESIA/SEDATION: Versed 1 mg IV; Fentanyl 50 mcg IV  Sedation time  20 minutes  FLUOROSCOPY TIME:  2 minutes 12 seconds.  COMPLICATIONS: None immediate  PROCEDURE: Informed written consent was obtained from the patient following explanation of the procedure, risks, benefits and alternatives. A time out was performed prior to the initiation of the procedure. Ultrasound scanning was performed to demarcate the edge of the left lobe of the liver. Maximal barrier sterile technique utilized including caps, mask, sterile gowns, sterile gloves, large sterile drape, hand hygiene and Betadine prep.  The left upper quadrant was sterilely prepped and draped. An oral gastric catheter was inserted into the stomach under fluoroscopy. The existing nasogastric feeding tube was removed. The left costal margin and air opacified transverse colon were identified and avoided. Air was injected into the stomach for insufflation  and visualization under fluoroscopy. Under sterile conditions a 17 gauge trocar needle was utilized to access the stomach percutaneously beneath the left subcostal margin after the overlying soft tissues were anesthetized with 1% Lidocaine with epinephrine. Needle position was confirmed within the stomach with aspiration of air and injection of small amount of contrast. A single T tack was deployed for gastropexy. Over an Amplatz guide wire, a 9-French sheath was inserted into the stomach. A snare device was utilized to capture the oral gastric catheter. The snare device was pulled retrograde from the stomach up the esophagus and out the oropharynx. The 20-French pull-through gastrostomy was connected to the snare device and pulled antegrade through the oropharynx down the esophagus into the stomach and then through the percutaneous tract external to the patient. The gastrostomy was assembled externally. Contrast injection confirms position in the stomach. Several spot radiographic images were obtained in various obliquities for documentation. The patient tolerated procedure well without immediate post procedural complication.  FINDINGS: After successful fluoroscopic guided placement, the gastrostomy tube is appropriately positioned with internal disc against the ventral aspect of the gastric lumen.  IMPRESSION: Successful fluoroscopic insertion of a 20-French pull-through gastrostomy tube.  The gastrostomy may be used immediately for medication administration and in 24 hrs for the initiation of feeds.   Electronically Signed   By: Simonne ComeJohn  Watts M.D.   On: 08/04/2014 13:18    Recent Labs  08/03/14 0555  WBC 9.5  HGB 11.2*  HCT 34.6*  PLT 362    Recent Labs  08/03/14 0555  NA 139  K 3.4*  CL 95*  GLUCOSE 192*  BUN 15  CREATININE 0.67  CALCIUM 8.4   CBG (last 3)  Recent Labs  08/04/14 1944 08/05/14 0011 08/05/14 0416  GLUCAP 97 92 90    Wt Readings from Last 3 Encounters:  08/05/14  96.7 kg (213 lb 3 oz)  07/26/14 93 kg (205 lb 0.4 oz)  07/26/14 93 kg (205 lb 0.4 oz)    Physical Exam:  Constitutional:  No acute distress.  HENT: orlal mucosa pink/moist  Head: Normocephalic.  Eyes: EOM are normal.  Neck:  #6 cuffed trach in place--secretions thru trach  Cardiac rate controlled. No murmurs. Reg rhythm  Respiratory:  No distress. No audible breath sounds.   rhonchi on exam.  GI: Soft. Bowel sounds are normal. He exhibits no distension. G-tube site clean with minimal drainage  Neurological: .  Patient is able to provide his name, age and date of birth. Follows simple commands. CN exam grossly intact. UE: 3+ delt, 4/5 bicep/tricep/HI. LE's: 3/5 HF, 3+KE, 4- ankles. No gross sensory changes. Good sitting balance.  Psychiatric:  Flat. Appears alert Skin: a few scattered echhymoses on limbs still   Assessment/Plan: 1. Functional deficits secondary to severe deconditioning after respiratory failure which require 3+ hours per day of interdisciplinary therapy in a comprehensive inpatient rehab setting. Physiatrist is providing close team supervision and 24 hour management of active medical problems listed below. Physiatrist and rehab team continue to assess barriers to discharge/monitor patient progress toward functional and medical goals.  FIM: FIM - Bathing Bathing Steps Patient Completed: Chest;Right Arm;Left Arm;Abdomen;Front perineal area;Buttocks;Right upper leg;Left upper leg Bathing: 4: Min-Patient completes 8-9 [id number] 10 parts or 75+ percent  FIM - Upper Body Dressing/Undressing Upper body dressing/undressing: 0: Wears gown/pajamas-no public clothing FIM - Lower Body Dressing/Undressing Lower body dressing/undressing steps patient completed: Don/Doff right sock;Don/Doff left sock Lower body dressing/undressing: 0: Wears gown/pajamas-no public clothing  FIM - Toileting Toileting steps completed by patient: Performs perineal hygiene;Adjust clothing prior to  toileting;Adjust clothing after toileting Toileting Assistive Devices: Grab bar or rail for support Toileting: 5: Supervision: Safety issues/verbal cues  FIM - Diplomatic Services operational officerToilet Transfers Toilet Transfers Assistive Devices: Bedside commode Toilet Transfers: 5-To toilet/BSC: Supervision (verbal cues/safety issues);5-From toilet/BSC: Supervision (verbal cues/safety issues)  FIM - Press photographerBed/Chair Transfer Bed/Chair Transfer Assistive Devices: Arm rests Bed/Chair Transfer: 0: Activity did not occur  FIM - Locomotion: Wheelchair Distance: 100 Locomotion: Wheelchair: 0: Activity did not occur FIM - Locomotion: Ambulation Locomotion: Ambulation Assistive Devices: Designer, industrial/productWalker - Rolling Ambulation/Gait Assistance: 4: Min guard;1: +2 Total assist (for line management) Locomotion: Ambulation: 0: Activity did not occur  Comprehension Comprehension Mode: Auditory Comprehension: 5-Understands complex 90% of the time/Cues < 10% of the time  Expression Expression Mode: Nonverbal Expression Assistive Devices: 6-Communication board Expression: 5-Expresses basic needs/ideas: With extra time/assistive device  Social Interaction Social Interaction: 5-Interacts appropriately 90% of the time - Needs monitoring or encouragement for participation or interaction.  Problem Solving Problem Solving: 5-Solves basic 90% of the time/requires cueing < 10% of the time  Memory Memory: 5-Recognizes or recalls 90% of the time/requires cueing < 10% of the time  Medical Problem List and Plan:  1. Functional deficits secondary to debilitation after cardiac arrest/respiratory failure.  2. 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   -continue #6 cuffed trach for now with cuff up per CCS  -IS/FV  -pulmonary toilet  -continue augmentin  -secretions persistent but better in general  -afebrile    2. DVT Prophylaxis/Anticoagulation: Subcutaneous heparin. Monitor platelet counts and any signs of bleeding  3. Pain Management:  Tylenol as needed  4.  Severe Oral-pharyngeal --   -after long  discussion with patient, he's agreed to G-tube   -G-tube placed---appreciate IR's help!  -begin feedings today, dc IVF once started. 5. Neuropsych: This patient is capable of making decisions on his own behalf.   -appreciate neuropsych eval/recs---agree--initiate lexapro qhs 6. Skin/Wound Care: Routine skin care. Trach care as directed  7. Fluids/Electrolytes/Nutrition:  Following weights and I's and o's  8. CAD/CABG. Patient has refused cardiac catheterization in the past  9. Diastolic congestive heart failure. Continue Lasix as directed. Monitor for any signs of fluid overload  10. Diabetes mellitus with peripheral neuropathy.   -resume lantus 55u qam and 55 u qpm once TF initiated  11. Tobacco abuse. Provide counseling   12. Chronic renal insufficiency. Baseline creatinine 1.5-2.   13. Extensive sinus disease left mastoid. Continue Augmentin for time being        LOS (Days) 10 A FACE TO FACE EVALUATION WAS PERFORMED  Donzella Carrol T 08/05/2014 8:22 AM

## 2014-08-05 NOTE — Progress Notes (Signed)
Occupational Therapy Weekly Progress Note  Patient Details  Name: Greogory G Diem MRN: [id number] Date of Birth: 08/22/1961  Beginning of progress report period: July 27, 2014 End of progress report period: August 05, 2014  Patient has met 3 of 5 short term goals.  Pt made steady progress with bathing tasks, transfers, and toileting during this past week.  Pt continues to decline donning clothing and prefers to wear hospital gowns.  Pt is reluctant to use AE to assist with bathing feet and donning socks, requiring max encouragement and often refusing.  Pt performs transfers with supervision but requires min verbal cues for safety awareness.  Pt requires min to max encouragement to participate in therapies other than bathing.    Patient continues to demonstrate the following deficits: Impaired endurance, impaired safety awareness, edema, and decreased ability to compensate for respiratory deficits and therefore will continue to benefit from skilled OT intervention to enhance overall performance with BADL.  Patient progressing toward long term goals..  Continue plan of care.  OT Short Term Goals Week 1:  OT Short Term Goal 1 (Week 1): LB Dressing: Min assist to include sit and stand OT Short Term Goal 1 - Progress (Week 1): Other (comment) (patient does not wear clothing at this time) OT Short Term Goal 2 (Week 1): Toileting: Supervision for 3/3 toilet tasks to include sit and stand OT Short Term Goal 2 - Progress (Week 1): Met OT Short Term Goal 3 (Week 1): Toilet transfer: steady assist with LRAD and standard toilet height OT Short Term Goal 3 - Progress (Week 1): Met OT Short Term Goal 4 (Week 1): Groom:  Stand at sink for grooming tasks OT Short Term Goal 4 - Progress (Week 1): Met OT Short Term Goal 5 (Week 1): Simple HM tasks:  Min assist with LRAD to gather clothes prior to B/Dsg session and/or assist with clean up after B/Dsg OT Short Term Goal 5 - Progress (Week 1): Discontinued  (comment) (patient not wearing clothing at this time) Week 2:  OT Short Term Goal 1 (Week 2): LB dressing: supervision with sit<>stand OT Short Term Goal 2 (Week 2): Simple HM tasks: Min assist with LRAD to gather clothes prior to B/D and assist with clean up OT Short Term Goal 3 (Week 2): Pt will perform bathing at supervision level with sit<>stand   Therapy Documentation Precautions:  Precautions Precautions: Fall Precaution Comments: h/o falls PTA per chart, trach and O2 Restrictions Weight Bearing Restrictions: No  See FIM for current functional status   Lanier, Thomas Chappell 08/05/2014, 8:00 AM   

## 2014-08-05 NOTE — Progress Notes (Signed)
NUTRITION FOLLOW-UP  DOCUMENTATION CODES Per approved criteria  -Morbid Obesity   INTERVENTION: Continue advancing TF of Vital AF 1.2 formula per PEG tube to goal rate of 85 ml/hr for 20 hours (in anticipation of therapy) with 200 ml free water every 6 hours.  Tube feeding regimen provides 2040 kcal, 127 grams of protein, and 1378 ml of H2O. Total free water: 2178 ml  Will continue to monitor.  NUTRITION DIAGNOSIS: Inadequate oral intake related to inability to eat as evidenced by NPO status; ongoing  Goal: Pt to meet >/= 90% of their estimated nutrition needs; not met  Monitor:  TF tolerance, weight trends, labs  53 y.o. male  Admitting Dx: Weakness  ASSESSMENT: 10/1-Pt admitted [facility] for hyperkalemia and AMS. Pt has cardiac arrest while undergoing echocardiogram. Pt intubated and had difficulty with extubation. Pt had trach placed 9/24.  Pt failed swallow evaluation 10/1 and now plans to have feeding tube placed and to start enteral nutrition. No signs of fat or muscle depletion.   10/7- Panda tube came out ("coughed out") last night.  Panda tube was not replaced as pt is agreeable to PEG tube placement. TF has been stopped/discontinued for now. Prior to tube coming out RN reports pt has been tolerating his feedings well. Pt with continue NPO status. IR PA to see pt to discuss PEG tube placement procedure. Recommend continuing TF regimen once PEG tube is placed and stable for use.  10/9- PEG tube placed 10/8. RD consulted for TF management and initiation. To start TF once PEG tube if ready and stable for use per IR. Per RN, TF was started at 1100. Discussed with RN about TF regimen.  Will continue to monitor.  Labs: Low potassium and chloride.  Height: Ht Readings from Last 1 Encounters:  07/26/14 5\' 2"  (1.575 m)    Weight: Wt Readings from Last 1 Encounters:  08/05/14 213 lb 3 oz (96.7 kg)    BMI:  Body mass index is 38.98 kg/(m^2).  Re-Estimated  Nutritional Needs: Kcal: 2000-2200 Protein: 110-125 grams Fluid: > 2 L/day  Skin: +2 generalized edema, incision on right leg  Diet Order: NPO  No intake or output data in the 24 hours ending 08/05/14 0950  Last BM: 10/9  Labs:   Recent Labs Lab 08/03/14 0555  NA 139  K 3.4*  CL 95*  CO2 32  BUN 15  CREATININE 0.67  CALCIUM 8.4  MG 1.3*  PHOS 3.1  GLUCOSE 192*    CBG (last 3)   Recent Labs  08/05/14 0011 08/05/14 0416 08/05/14 0842  GLUCAP 92 90 101*    Scheduled Meds: . amoxicillin-clavulanate  1 tablet Per Tube Q12H  . antiseptic oral rinse  7 mL Mouth Rinse QID  . atorvastatin  80 mg Per Tube q1800  . chlorhexidine  15 mL Mouth Rinse BID  . escitalopram  10 mg Per Tube QHS  . free water  200 mL Per Tube 4 times per day  . furosemide  20 mg Per Tube Daily  . heparin  5,000 Units Subcutaneous 3 times per day  . insulin aspart  0-20 Units Subcutaneous 6 times per day  . [START ON 08/06/2014] insulin glargine  55 Units Subcutaneous QHS  . [START ON 08/06/2014] insulin glargine  55 Units Subcutaneous Daily  . pantoprazole sodium  40 mg Per Tube Daily  . scopolamine  1 patch Transdermal Q72H    Continuous Infusions: . dextrose 5 % and 0.45 % NaCl with  KCl 20 mEq/L 50 mL/hr at 08/03/14 0900  . feeding supplement (VITAL AF 1.2 CAL) 1,000 mL (08/02/14 1422)    Past Medical History  Diagnosis Date  . CAD (coronary artery disease) of artery bypass graft 07/11/2014  . Cardiac arrest 07/11/2014  . CHF (congestive heart failure) 07/11/2014  . HTN (hypertension) 07/11/2014  . COPD (chronic obstructive pulmonary disease) 07/11/2014  . DM (diabetes mellitus) 07/11/2014  . GERD (gastroesophageal reflux disease) 07/11/2014  . Acute on chronic kidney failure 07/11/2014    History reviewed. No pertinent past surgical history.  Kallie Locks, MS, RD, Provisional LDN Pager # [phone] After hours/ weekend pager # [phone]

## 2014-08-05 NOTE — Progress Notes (Signed)
Speech Language Pathology Daily Session Note  Patient Details  Name: Richard [name] MRN: [id number] Date of Birth: Feb 06, 1961  Today's Date: 08/05/2014 SLP Individual Time: 1300-1400 SLP Individual Time Calculation (min): 60 min  Short Term Goals: Week 2: SLP Short Term Goal 1 (Week 2): Pt will complete pharyngeal strengthening exercise to maximize swallowing function with Mod cues SLP Short Term Goal 2 (Week 2): Patient will recall new, daily information with use of external memory aids with supervision multimodal cues.  SLP Short Term Goal 3 (Week 2): Patient will utilize multimodal communication to express wants/needs with Mod A multimodal cues.  SLP Short Term Goal 4 (Week 2): Patient will demonstrate functional problem solving for basic and familiar tasks with Min A multimodal cues.   Skilled Therapeutic Interventions: Skilled treatment focused on cognitive-linguistic goals and multimodal communication. Upon arrival, patient was awake and upright in his recliner. Patient reported pain of his PEG site but was agreeable to participate in cognitive task. Patient independently request to use the bathroom X 2 throughout the session using gestures and transferred to bedside commode with supervision A and assistance with managing multiple lines/tubes. Patient required supervision multimodal cues for problem solving with a basic and familiar card task and verbalized wants/needs with use of gestures and mouthing words. Patient also demonstrated selective attention to task for 30 minutes with supervision verbal cues. Patient left with all needs within reach. Continue with current plan of care.    FIM:  Comprehension Comprehension Mode: Auditory Comprehension: 5-Understands complex 90% of the time/Cues < 10% of the time Expression Expression Mode: Nonverbal Expression Assistive Devices: 6-Communication board Expression: 5-Expresses basic needs/ideas: With extra time/assistive device Social  Interaction Social Interaction: 5-Interacts appropriately 90% of the time - Needs monitoring or encouragement for participation or interaction. Problem Solving Problem Solving: 5-Solves basic 90% of the time/requires cueing < 10% of the time Memory Memory: 5-Recognizes or recalls 90% of the time/requires cueing < 10% of the time FIM - Eating Eating Activity: 1: Helper performs IV, parenteral, or tube feeding  Pain Reported discomfort at PEG site, however, unable to rate. RN notified.   Therapy/Group: Individual Therapy  Richard [name] 08/05/2014, 4:27 PM

## 2014-08-05 NOTE — Progress Notes (Signed)
Physical Therapy Weekly Progress Note  Patient Details  Name: Richard [name] MRN: [id number] Date of Birth: 1961/06/06  Beginning of progress report period: July 27, 2014 End of progress report period: August 05, 2014  Today's Date: 08/05/2014 PT Missed Time: 60 Minutes Missed Time Reason: Patient unwilling to participate;Other (Comment) (due to PEG )  Pt continues to make slow, but steady progress regarding overall functional endurance, transfers and mobility in physical therapy. Pt has achieved 1/3 STGs, see details below. Pt currently req supervision for transfers and w/c propulsion, Supervision-min guard A for standing balance, ambulation with RW up to 150' as well as stairs. Occasional need for +2 assist for management of lines or w/c follow. Frequent rest breaks needed during session due to fatigue as well as for management of secretions. SaO2 consistently >90% via trach collar with recommended settings by RT, see vitals sheet. Pt recently demonstrating decreased participation in therapy sessions, despite encouragement, due to refusal from fatigue or discomfort from PEG placement. Will continue per current POC.  Patient continues to demonstrate the following deficits: decreased activity tolerance, decreased functional endurance, O2 dependence, decreased balance, decreased strength, decreased overall functional transfers and mobility and therefore will continue to benefit from skilled PT intervention to enhance overall performance with activity tolerance, balance and strength, functional transfers and mobility.  Patient progressing toward long term goals.  Continue plan of care.  PT Short Term Goals Week 1:  PT Short Term Goal 1 (Week 1): Pt will perform all aspects of bed mobility at min A level with HOB flat and without rails.  PT Short Term Goal 1 - Progress (Week 1): Discontinued (comment) (Pt reports that he sleeps in a recliner) PT Short Term Goal 2 (Week 1): Pt will perform  stand pivot transfers at S level PT Short Term Goal 2 - Progress (Week 1): Met PT Short Term Goal 3 (Week 1): Pt will ambulate x 50' w/ LRAD at S level with SaO2 >90%.   PT Short Term Goal 3 - Progress (Week 1): Progressing toward goal Week 2:  PT Short Term Goal 1 (Week 2): Pt to ambulate 150' with LRAD, supervision and SaO2>90% PT Short Term Goal 2 (Week 2): Pt to negotiate up/down 5 steps with B rails and supervision PT Short Term Goal 3 (Week 2): Pt to tolerate standing activity for 74mn with LRAD, supervision and SaO2 >50%  Skilled Therapeutic Interventions/Progress Updates:  1:1. Pt received sitting in recliner, lightly sleeping but easy to wake. Pt immediately shaking his head no to participation in therapy session pointing to PEG tube and with encouraged used of communication board writing, "I need one more day." Pt educated on role of therapies regarding goals, benefits and overall recovery. Therapist providing max encouragement for participation, but pt continued to decline. Will continue per current POC. RN aware.   Therapy Documentation Precautions:  Precautions Precautions: Fall Precaution Comments: h/o falls PTA per chart, trach and O2 Restrictions Weight Bearing Restrictions: No General: PT Amount of Missed Time (min): 60 Minutes PT Missed Treatment Reason: Patient unwilling to participate;Other (Comment) (due to PEG ) Vital Signs: Therapy Vitals Temp: 99.1 F (37.3 C) Temp Source: Oral Pulse Rate: 68 Resp: 18 BP: 128/66 mmHg Patient Position (if appropriate): Sitting Oxygen Therapy SpO2: 99 % O2 Device: Trach collar O2 Flow Rate (L/min): 5 L/min FiO2 (%): 28 %  See FIM for current functional status  Therapy/Group: Individual Therapy  KGilmore Laroche10/06/2014, 8:12 AM

## 2014-08-05 NOTE — Progress Notes (Signed)
Patient ID: Richard [name], male   DOB: [birth year], 53 y.o.   MRN: [id number]     Subjective: pt sitting up in chair; appears comfortable; has some mild tenderness at G tube site as expected    Allergies: Review of patient's allergies indicates no known allergies.  Medications: Prior to Admission medications   Medication Sig Start Date End Date Taking? Authorizing Provider  albuterol (PROVENTIL HFA;VENTOLIN HFA) 108 (90 BASE) MCG/ACT inhaler Inhale 1-2 puffs into the lungs every 6 (six) hours as needed for wheezing or shortness of breath.   Yes Historical Provider, MD  Amino Acids-Protein Hydrolys (FEEDING SUPPLEMENT, PRO-STAT SUGAR FREE 64,) LIQD Take 60 mLs by mouth 2 (two) times daily at 10 am and 4 pm. 07/26/14  Yes Simonne MartinetPeter E Babcock, NP  amoxicillin-clavulanate (AUGMENTIN) 875-125 MG per tablet Take 1 tablet by mouth every 12 (twelve) hours. 07/26/14  Yes Simonne MartinetPeter E Babcock, NP  antiseptic oral rinse (CPC / CETYLPYRIDINIUM CHLORIDE 0.05%) 0.05 % LIQD solution 7 mLs by Mouth Rinse route QID. 07/26/14  Yes Simonne MartinetPeter E Babcock, NP  atorvastatin (LIPITOR) 80 MG tablet Place 1 tablet (80 mg total) into feeding tube daily at 6 PM. 07/26/14  Yes Simonne MartinetPeter E Babcock, NP  chlorhexidine (PERIDEX) 0.12 % solution 15 mLs by Mouth Rinse route 2 (two) times daily. 07/26/14  Yes Simonne MartinetPeter E Babcock, NP  furosemide (LASIX) 10 MG/ML injection Inject 2 mLs (20 mg total) into the vein daily. 07/26/14  Yes Simonne MartinetPeter E Babcock, NP  heparin 5000 UNIT/ML injection Inject 1 mL (5,000 Units total) into the skin every 8 (eight) hours. 07/26/14  Yes Simonne MartinetPeter E Babcock, NP  insulin aspart (NOVOLOG) 100 UNIT/ML injection Inject 0-20 Units into the skin every 4 (four) hours. 07/26/14  Yes Simonne MartinetPeter E Babcock, NP  insulin glargine (LANTUS) 100 UNIT/ML injection Inject 0.4 mLs (40 Units total) into the skin 2 (two) times daily. 07/26/14  Yes Simonne MartinetPeter E Babcock, NP  Maltodextrin-Xanthan Gum (RESOURCE THICKENUP CLEAR) POWD Thicken 07/26/14  Yes Simonne MartinetPeter E  Babcock, NP  metoprolol succinate (TOPROL-XL) 50 MG 24 hr tablet Take 50 mg by mouth daily. Take with or immediately following a meal.   Yes Historical Provider, MD  pantoprazole (PROTONIX) 40 MG tablet Take 1 tablet (40 mg total) by mouth daily. 07/26/14  Yes Simonne MartinetPeter E Babcock, NP  tamsulosin (FLOMAX) 0.4 MG CAPS capsule Take 0.4 mg by mouth daily.   Yes Historical Provider, MD    Review of Systems see above  Vital Signs: BP 128/66  Pulse 74  Temp(Src) 99.1 F (37.3 C) (Oral)  Resp 18  Ht 5\' 2"  (1.575 m)  Wt 213 lb 3 oz (96.7 kg)  BMI 38.98 kg/m2  SpO2 98%  Physical Exam pt awake/alert; G tube intact, dressing dry, site mildly tender, abd soft  Imaging: Dg Chest 2 View  08/03/2014   CLINICAL DATA:  Pneumonia, shortness of breath, tracheostomy  EXAM: CHEST  2 VIEW  COMPARISON:  Chest x-ray of 07/28/2014  FINDINGS: There is vague opacity at the lung bases most consistent with atelectasis. A patchy area of pneumonia cannot be excluded. There is blunting of the left costophrenic angle which may represent small left effusion. Cardiomegaly is stable and there may be minimal pulmonary vascular congestion. A tracheostomy remains. Median sternotomy sutures are noted from CABG.  IMPRESSION: Diminished aeration with basilar atelectasis. Cannot exclude pneumonia. Question mild congestion is well.   Electronically Signed   By: Dwyane DeePaul  Barry M.D.   On: 08/03/2014 08:16  Ir Gastrostomy Tube Mod Sed  08/04/2014   INDICATION: Generalized weakness, altered mental status, respiratory failure, post tracheostomy tube placement, now with dysphagia. Request made for percutaneous gastrostomy tube placement.  EXAM: PULL TROUGH GASTROSTOMY TUBE PLACEMENT  COMPARISON:  CT abdomen pelvis - 06/09/2014  MEDICATIONS: Patient is currently admitted to the hospital and receiving intravenous antibiotics. The antibiotics were administered within 1 hour of the procedure.  CONTRAST:  20 mL of Isovue 300 administered into the  gastric lumen.  ANESTHESIA/SEDATION: Versed 1 mg IV; Fentanyl 50 mcg IV  Sedation time  20 minutes  FLUOROSCOPY TIME:  2 minutes 12 seconds.  COMPLICATIONS: None immediate  PROCEDURE: Informed written consent was obtained from the patient following explanation of the procedure, risks, benefits and alternatives. A time out was performed prior to the initiation of the procedure. Ultrasound scanning was performed to demarcate the edge of the left lobe of the liver. Maximal barrier sterile technique utilized including caps, mask, sterile gowns, sterile gloves, large sterile drape, hand hygiene and Betadine prep.  The left upper quadrant was sterilely prepped and draped. An oral gastric catheter was inserted into the stomach under fluoroscopy. The existing nasogastric feeding tube was removed. The left costal margin and air opacified transverse colon were identified and avoided. Air was injected into the stomach for insufflation and visualization under fluoroscopy. Under sterile conditions a 17 gauge trocar needle was utilized to access the stomach percutaneously beneath the left subcostal margin after the overlying soft tissues were anesthetized with 1% Lidocaine with epinephrine. Needle position was confirmed within the stomach with aspiration of air and injection of small amount of contrast. A single T tack was deployed for gastropexy. Over an Amplatz guide wire, a 9-French sheath was inserted into the stomach. A snare device was utilized to capture the oral gastric catheter. The snare device was pulled retrograde from the stomach up the esophagus and out the oropharynx. The 20-French pull-through gastrostomy was connected to the snare device and pulled antegrade through the oropharynx down the esophagus into the stomach and then through the percutaneous tract external to the patient. The gastrostomy was assembled externally. Contrast injection confirms position in the stomach. Several spot radiographic images were  obtained in various obliquities for documentation. The patient tolerated procedure well without immediate post procedural complication.  FINDINGS: After successful fluoroscopic guided placement, the gastrostomy tube is appropriately positioned with internal disc against the ventral aspect of the gastric lumen.  IMPRESSION: Successful fluoroscopic insertion of a 20-French pull-through gastrostomy tube.  The gastrostomy may be used immediately for medication administration and in 24 hrs for the initiation of feeds.   Electronically Signed   By: Simonne ComeJohn  Watts M.D.   On: 08/04/2014 13:18    Labs:  CBC:  Recent Labs  07/26/14 1948 07/27/14 0755 07/29/14 0520 08/03/14 0555  WBC 12.2* 8.7 9.6 9.5  HGB 12.1* 12.4* 12.0* 11.2*  HCT 38.1* 39.3 38.6* 34.6*  PLT 370 366 PLATELET CLUMPS NOTED ON SMEAR, COUNT APPEARS ADEQUATE 362    COAGS:  Recent Labs  07/11/14 0640 07/19/14 0409 07/21/14 1050  INR 1.10 1.30  --   APTT 38*  --  26    BMP:  Recent Labs  07/26/14 0428 07/26/14 1948 07/27/14 0755 07/29/14 0520 08/03/14 0555  NA 141  --  141 142 139  K 3.6*  --  3.6* 4.4 3.4*  CL 101  --  100 102 95*  CO2 28  --  28 28 32  GLUCOSE 183*  --  145* 79 192*  BUN 27*  --  25* 17 15  CALCIUM 8.5  --  9.0 8.9 8.4  CREATININE 0.87 0.84 0.83 0.72 0.67  GFRNONAA >90 >90 >90 >90 >90  GFRAA >90 >90 >90 >90 >90    LIVER FUNCTION TESTS:  Recent Labs  07/11/14 0915 07/12/14 0400 07/27/14 0755  BILITOT 0.3 0.3 0.7  AST 24 20 17   ALT 39 34 29  ALKPHOS 113 89 135*  PROT 6.5 5.9* 7.3  ALBUMIN 3.4* 3.0* 3.0*    Assessment and Plan: S/p perc gastrostomy tube 10/8; ok to use tube for feeds; other plans as per primary        Signed: Amirah Goerke,D KEVIN 08/05/2014, 12:59 PM

## 2014-08-05 NOTE — Progress Notes (Signed)
Occupational Therapy Session Note  Patient Details  Name: IKTAN AIKMAN MRN: [id number] Date of Birth: 1961-03-09  Today's Date: 08/05/2014 OT Individual Time: 0700-0800 OT Individual Time Calculation (min): 60 min    Short Term Goals: Week 1:  OT Short Term Goal 1 (Week 1): LB Dressing: Min assist to include sit and stand OT Short Term Goal 1 - Progress (Week 1): Other (comment) (patient does not wear clothing at this time) OT Short Term Goal 2 (Week 1): Toileting: Supervision for 3/3 toilet tasks to include sit and stand OT Short Term Goal 2 - Progress (Week 1): Met OT Short Term Goal 3 (Week 1): Toilet transfer: steady assist with LRAD and standard toilet height OT Short Term Goal 3 - Progress (Week 1): Met OT Short Term Goal 4 (Week 1): Groom:  Stand at sink for grooming tasks OT Short Term Goal 4 - Progress (Week 1): Met OT Short Term Goal 5 (Week 1): Simple HM tasks:  Min assist with LRAD to gather clothes prior to B/Dsg session and/or assist with clean up after B/Dsg OT Short Term Goal 5 - Progress (Week 1): Discontinued (comment) (patient not wearing clothing at this time)  Skilled Therapeutic Interventions/Progress Updates:    Pt engaged in bathing with sit<>stand from recliner requiring assistance to bathe feet.  Pt declined use of sponge to bathe feet.  Pt declined donning clothing and continues to prefer hospital gown.  Pt requested use of BSC and transferred with supervision.  Pt stood at bedside table to complete grooming tasks. Pt continues to require min verbal cues for safety awareness.  Pt declines use of white board to communicate, preferring to use hand gestures.  Focus on activity tolerance, dynamic standing balance, safety awareness, and transfers.  Therapy Documentation Precautions:  Precautions Precautions: Fall Precaution Comments: h/o falls PTA per chart, trach and O2 Restrictions Weight Bearing Restrictions: No  See FIM for current functional  status  Therapy/Group: Individual Therapy  Leroy Libman 08/05/2014, 8:04 AM

## 2014-08-05 NOTE — Progress Notes (Signed)
Removed sutures per PA order. Pt tolerated well. Pt refused suctioning. Trach secured with trach ties. RN notified

## 2014-08-06 ENCOUNTER — Encounter (HOSPITAL_COMMUNITY): Payer: Medicaid Other | Admitting: Occupational Therapy

## 2014-08-06 DIAGNOSIS — I469 Cardiac arrest, cause unspecified: Secondary | ICD-10-CM

## 2014-08-06 LAB — GLUCOSE, CAPILLARY
GLUCOSE-CAPILLARY: 170 mg/dL — AB (ref 70–99)
Glucose-Capillary: 159 mg/dL — ABNORMAL HIGH (ref 70–99)
Glucose-Capillary: 160 mg/dL — ABNORMAL HIGH (ref 70–99)
Glucose-Capillary: 192 mg/dL — ABNORMAL HIGH (ref 70–99)
Glucose-Capillary: 194 mg/dL — ABNORMAL HIGH (ref 70–99)
Glucose-Capillary: 211 mg/dL — ABNORMAL HIGH (ref 70–99)

## 2014-08-06 NOTE — Progress Notes (Signed)
Occupational Therapy Note  Patient Details  Name: Cyril LoosenJimmie G Chalfant MRN: [id number] Date of Birth: [birth year]  Today's Date: 08/06/2014 OT Missed Time: 60 Minutes Missed Time Reason: Patient unwilling/refused to participate without medical reason  Pt missed 60 mins TAG group due to refusal.  Pt refused rehab tech and then this therapist when asked to join in scheduled group treatment session.  Pt immediately shaking head when therapist introduced herself.  Max encouragement to participate, with pt continuing to decline.  Rosalio LoudHOXIE, Faun Mcqueen 08/06/2014, 2:58 PM

## 2014-08-06 NOTE — Progress Notes (Signed)
Patient ID: Cyril LoosenJimmie G Difiore, male   DOB: [birth year], 53 y.o.   MRN: [id number]   [place] [facility]     PROGRESS NOTE    08/06/14.  Subjective/Complaints: 53 y/o admit for CIR with functional deficits secondary to debilitation after cardiac arrest/respiratory failure.   Comfortable night  A  review of systems has been performed and if not noted above is otherwise negative.  Past Medical History  Diagnosis Date  . CAD (coronary artery disease) of artery bypass graft 07/11/2014  . Cardiac arrest 07/11/2014  . CHF (congestive heart failure) 07/11/2014  . HTN (hypertension) 07/11/2014  . COPD (chronic obstructive pulmonary disease) 07/11/2014  . DM (diabetes mellitus) 07/11/2014  . GERD (gastroesophageal reflux disease) 07/11/2014  . Acute on chronic kidney failure 07/11/2014    Patient Vitals for the past 24 hrs:  BP Temp Temp src Pulse Resp SpO2  08/06/14 0749 - - - 82 18 97 %  08/06/14 0600 120/64 mmHg 98.6 F (37 C) Oral 86 18 100 %  08/06/14 0353 - - - 84 18 95 %  08/06/14 0003 - - - 81 18 97 %  08/05/14 2328 124/68 mmHg 98.5 F (36.9 C) Oral 78 18 98 %  08/05/14 2023 - - - 85 18 97 %  08/05/14 1619 - - - 95 18 98 %  08/05/14 1300 121/70 mmHg 98.9 F (37.2 C) Oral 85 18 98 %    No intake or output data in the 24 hours ending 08/06/14 1002    Objective: Vital Signs: Blood pressure 120/64, pulse 82, temperature 98.6 F (37 C), temperature source Oral, resp. rate 18, height 5\' 2"  (1.575 m), weight 96.7 kg (213 lb 3 oz), SpO2 97.00%. Ir Gastrostomy Tube Mod Sed  08/04/2014   INDICATION: Generalized weakness, altered mental status, respiratory failure, post tracheostomy tube placement, now with dysphagia. Request made for percutaneous gastrostomy tube placement.  EXAM: PULL TROUGH GASTROSTOMY TUBE PLACEMENT  COMPARISON:  CT abdomen pelvis - 06/09/2014  MEDICATIONS: Patient is currently admitted to the hospital and receiving intravenous antibiotics. The  antibiotics were administered within 1 hour of the procedure.  CONTRAST:  20 mL of Isovue 300 administered into the gastric lumen.  ANESTHESIA/SEDATION: Versed 1 mg IV; Fentanyl 50 mcg IV  Sedation time  20 minutes  FLUOROSCOPY TIME:  2 minutes 12 seconds.  COMPLICATIONS: None immediate  PROCEDURE: Informed written consent was obtained from the patient following explanation of the procedure, risks, benefits and alternatives. A time out was performed prior to the initiation of the procedure. Ultrasound scanning was performed to demarcate the edge of the left lobe of the liver. Maximal barrier sterile technique utilized including caps, mask, sterile gowns, sterile gloves, large sterile drape, hand hygiene and Betadine prep.  The left upper quadrant was sterilely prepped and draped. An oral gastric catheter was inserted into the stomach under fluoroscopy. The existing nasogastric feeding tube was removed. The left costal margin and air opacified transverse colon were identified and avoided. Air was injected into the stomach for insufflation and visualization under fluoroscopy. Under sterile conditions a 17 gauge trocar needle was utilized to access the stomach percutaneously beneath the left subcostal margin after the overlying soft tissues were anesthetized with 1% Lidocaine with epinephrine. Needle position was confirmed within the stomach with aspiration of air and injection of small amount of contrast. A single T tack was deployed for gastropexy. Over an Amplatz guide wire, a 9-French sheath was inserted into the stomach. A snare  device was utilized to capture the oral gastric catheter. The snare device was pulled retrograde from the stomach up the esophagus and out the oropharynx. The 20-French pull-through gastrostomy was connected to the snare device and pulled antegrade through the oropharynx down the esophagus into the stomach and then through the percutaneous tract external to the patient. The gastrostomy was  assembled externally. Contrast injection confirms position in the stomach. Several spot radiographic images were obtained in various obliquities for documentation. The patient tolerated procedure well without immediate post procedural complication.  FINDINGS: After successful fluoroscopic guided placement, the gastrostomy tube is appropriately positioned with internal disc against the ventral aspect of the gastric lumen.  IMPRESSION: Successful fluoroscopic insertion of a 20-French pull-through gastrostomy tube.  The gastrostomy may be used immediately for medication administration and in 24 hrs for the initiation of feeds.   Electronically Signed   By: Simonne Come M.D.   On: 08/04/2014 13:18   No results found for this basename: WBC, HGB, HCT, PLT,  in the last 72 hours No results found for this basename: NA, K, CL, CO, GLUCOSE, BUN, CREATININE, CALCIUM,  in the last 72 hours CBG (last 3)   Recent Labs  08/06/14 08/06/14 0457 08/06/14 0743  GLUCAP 161* 160* 192*    Wt Readings from Last 3 Encounters:  08/05/14 96.7 kg (213 lb 3 oz)  07/26/14 93 kg (205 lb 0.4 oz)  07/26/14 93 kg (205 lb 0.4 oz)    Physical Exam:  Constitutional:  No acute distress.  HENT: orlal mucosa pink/moist  Head: Normocephalic.  Eyes: EOM are normal.  Neck:  #6 cuffed trach in place--secretions thru trach  Cardiac rate controlled. No murmurs. Reg rhythm  Respiratory:  No distress. No audible breath sounds.   rhonchi on exam.  GI: Soft. Bowel sounds are normal. He exhibits no distension. G-tube site clean with minimal drainage Extremities- +1 edema  Neurological: .  Patient is able to provide his name, age and date of birth. Follows simple commands. CN exam grossly intact. UE: 3+ delt, 4/5 bicep/tricep/HI. LE's: 3/5 HF, 3+KE, 4- ankles. No gross sensory changes. Good sitting balance.  Psychiatric:  Flat. Appears alert     Medical Problem List and Plan:  1. Functional deficits secondary to debilitation  after cardiac arrest/respiratory failure.  2. 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   -continue #6 cuffed trach for now with cuff up per CCS  -IS/FV  -pulmonary toilet  -continue augmentin  -secretions persistent but better in general  -afebrile    3. DVT Prophylaxis/Anticoagulation: Subcutaneous heparin. Monitor platelet counts and any signs of bleeding   4.  Severe Oral-pharyngeal --   -after long discussion with patient, he's agreed to G-tube   -G-tube placed---appreciate IR's help!  -begin feedings today, dc IVF once started. 5. Neuropsych: This patient is capable of making decisions on his own behalf.   -appreciate neuropsych eval/recs---agree--initiate lexapro qhs 6. Skin/Wound Care: Routine skin care. Trach care as directed   7. CAD/CABG. Patient has refused cardiac catheterization in the past  8. Diastolic congestive heart failure. Continue Lasix as directed. Monitor for any signs of fluid overload  9. Diabetes mellitus with peripheral neuropathy.   -resume lantus 55u qam and 55 u qpm once TF initiated   10. Chronic renal insufficiency. Baseline creatinine 1.5-2.   11. Extensive sinus disease left mastoid. Continue Augmentin for time being        LOS (Days) 11 A FACE TO FACE EVALUATION WAS PERFORMED  Rogelia Boga  08/06/2014 10:01 AM

## 2014-08-07 ENCOUNTER — Inpatient Hospital Stay (HOSPITAL_COMMUNITY): Payer: Medicaid Other | Admitting: *Deleted

## 2014-08-07 LAB — GLUCOSE, CAPILLARY
GLUCOSE-CAPILLARY: 234 mg/dL — AB (ref 70–99)
GLUCOSE-CAPILLARY: 247 mg/dL — AB (ref 70–99)
Glucose-Capillary: 170 mg/dL — ABNORMAL HIGH (ref 70–99)
Glucose-Capillary: 252 mg/dL — ABNORMAL HIGH (ref 70–99)
Glucose-Capillary: 247 mg/dL — ABNORMAL HIGH (ref 70–99)

## 2014-08-07 NOTE — Progress Notes (Signed)
Occupational Therapy Session Note  Patient Details  Name: RANDOM DOBROWSKI MRN: [id number] Date of Birth: 24-Mar-1961  Today's Date: 08/07/2014 OT Individual Time:  -   1545-1650  (26 min)      Short Term Goals: Week 1:  OT Short Term Goal 1 (Week 1): LB Dressing: Min assist to include sit and stand OT Short Term Goal 1 - Progress (Week 1): Other (comment) (patient does not wear clothing at this time) OT Short Term Goal 2 (Week 1): Toileting: Supervision for 3/3 toilet tasks to include sit and stand OT Short Term Goal 2 - Progress (Week 1): Met OT Short Term Goal 3 (Week 1): Toilet transfer: steady assist with LRAD and standard toilet height OT Short Term Goal 3 - Progress (Week 1): Met OT Short Term Goal 4 (Week 1): Groom:  Stand at sink for grooming tasks OT Short Term Goal 4 - Progress (Week 1): Met OT Short Term Goal 5 (Week 1): Simple HM tasks:  Min assist with LRAD to gather clothes prior to B/Dsg session and/or assist with clean up after B/Dsg OT Short Term Goal 5 - Progress (Week 1): Discontinued (comment) (patient not wearing clothing at this time) Week 2:  OT Short Term Goal 1 (Week 2): LB dressing: supervision with sit<>stand OT Short Term Goal 2 (Week 2): Simple HM tasks: Min assist with LRAD to gather clothes prior to B/D and assist with clean up OT Short Term Goal 3 (Week 2): Pt will perform bathing at supervision level with sit<>stand      Skilled Therapeutic Interventions/Progress Updates:    Pt. Sitting in chair watching tv.  Sister present.  Addressed UE AROM, fine motor coordination, sit to stand, transfers, toilet transfers.  Handed pt cards and asked him to deal out.  At first, he said NO, but with encouragement, he agreed.  He had difficulty with pincher grasp and dropped card 5 or six times,but otherwise was functional for tasks at hand. He followed game with minimal cognitive issues,ie dealt out wrong number of cards, or did not wait for his turn.  Pt mouthed all  his commands.  He won 2 out of 3 games and seemed unaffected by his wins.  He indicated he needed to go to bathroom.  Used BSC.  Pt did stand pivot transfer with minimal assist >3n1 and > chair.  Oxygen saturation stayed in high 80's or more during entire session.   He was set up with all needs in reach at end of session.  .    Therapy Documentation Precautions:  Precautions Precautions: Fall Precaution Comments: h/o falls PTA per chart, trach and O2 Restrictions Weight Bearing Restrictions: No    Vital Signs: Therapy Vitals Temp: 99.2 F (37.3 C) Temp Source: Oral Pulse Rate: 82 Resp: 19 BP: 137/61 mmHg Patient Position (if appropriate): Sitting Oxygen Therapy SpO2: 96 % O2 Device: Trach collar O2 Flow Rate (L/min): 5 L/min FiO2 (%): 28 % Pain:  none   See FIM for current functional status  Therapy/Group: Individual Therapy  Lisa Roca 08/07/2014, 5:12 PM

## 2014-08-07 NOTE — Progress Notes (Signed)
Patient ID: Richard [name], male   DOB: Feb 26, 1961, 53 y.o.   MRN: [id number]  Patient ID: Richard [name], male   DOB: Feb 26, 1961, 53 y.o.   MRN: [id number]   [place] PHYSICAL MEDICINE & REHABILITATION     PROGRESS NOTE    08/07/14.  Subjective/Complaints: 53 y/o admit for CIR with functional deficits secondary to debilitation after cardiac arrest/respiratory failure.   Comfortable night. No distress  A  review of systems has been performed and if not noted above is otherwise negative.  Past Medical History  Diagnosis Date  . CAD (coronary artery disease) of artery bypass graft 07/11/2014  . Cardiac arrest 07/11/2014  . CHF (congestive heart failure) 07/11/2014  . HTN (hypertension) 07/11/2014  . COPD (chronic obstructive pulmonary disease) 07/11/2014  . DM (diabetes mellitus) 07/11/2014  . GERD (gastroesophageal reflux disease) 07/11/2014  . Acute on chronic kidney failure 07/11/2014    Patient Vitals for the past 24 hrs:  BP Temp Temp src Pulse Resp SpO2 Weight  08/07/14 0611 114/87 mmHg 99 F (37.2 C) Oral 85 20 97 % 96.4 kg (212 lb 8.4 oz)  08/06/14 2031 133/63 mmHg 98.6 F (37 C) Oral 79 18 97 % -  08/06/14 1835 - 99.4 F (37.4 C) - - - - -  08/06/14 1521 131/57 mmHg 100.6 F (38.1 C) Oral 87 19 95 % -  08/06/14 1518 - - - 75 18 99 % -  08/06/14 1121 - - - 78 18 98 % -    No intake or output data in the 24 hours ending 08/07/14 0836    Objective: Vital Signs: Blood pressure 114/87, pulse 85, temperature 99 F (37.2 C), temperature source Oral, resp. rate 20, height 5\' 2"  (1.575 m), weight 96.4 kg (212 lb 8.4 oz), SpO2 97.00%. No results found. No results found for this basename: WBC, HGB, HCT, PLT,  in the last 72 hours No results found for this basename: NA, K, CL, CO, GLUCOSE, BUN, CREATININE, CALCIUM,  in the last 72 hours CBG (last 3)   Recent Labs  08/06/14 2341 08/07/14 0409 08/07/14 0754  GLUCAP 159* 170* 234*    Wt Readings from Last 3  Encounters:  08/07/14 96.4 kg (212 lb 8.4 oz)  07/26/14 93 kg (205 lb 0.4 oz)  07/26/14 93 kg (205 lb 0.4 oz)    Physical Exam:  Constitutional:  No acute distress.  HENT: orlal mucosa pink/moist  Head: Normocephalic.  Eyes: EOM are normal.  Neck:  #6 cuffed trach in place--secretions thru trach  Cardiac rate controlled. No murmurs. Reg rhythm  Respiratory:  No distress.  Clear anterolaterally  GI: Soft. Bowel sounds are normal. He exhibits no distension. G-tube site clean with minimal drainage Extremities- +1 edema  Neurological: .  Alert.  Nods appropriately to questions;  Good sitting balance.  Psychiatric:   Appears alert     Medical Problem List and Plan:  1. Functional deficits secondary to debilitation after cardiac arrest/respiratory failure.  2. 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   -continue #6 cuffed trach for now with cuff up per CCS  -IS/FV  -pulmonary toilet  -continue augmentin  -secretions persistent but better in general  -afebrile    3. DVT Prophylaxis/Anticoagulation: Subcutaneous heparin. Monitor platelet counts and any signs of bleeding   4.  Severe Oral-pharyngeal --   -after long discussion with patient, he's agreed to G-tube   -G-tube placed---appreciate IR's help!  -begin feedings today, dc IVF once started. 5. Neuropsych: This  patient is capable of making decisions on his own behalf.   -appreciate neuropsych eval/recs---agree--initiate lexapro qhs 6. Skin/Wound Care: Routine skin care. Trach care as directed   7. CAD/CABG. Patient has refused cardiac catheterization in the past  8. Diastolic congestive heart failure. Continue Lasix as directed. Monitor for any signs of fluid overload  9. Diabetes mellitus with peripheral neuropathy.   -resume lantus 55u qam and 55 u qpm once TF initiated   10. Chronic renal insufficiency. Baseline creatinine 1.5-2.   11. Extensive sinus disease left mastoid. Continue Augmentin for time  being        LOS (Days) 12 A FACE TO FACE EVALUATION WAS PERFORMED  Rogelia BogaKWIATKOWSKI,PETER FRANK 08/07/2014 8:36 AM

## 2014-08-08 ENCOUNTER — Encounter (HOSPITAL_COMMUNITY): Payer: Medicaid Other

## 2014-08-08 ENCOUNTER — Inpatient Hospital Stay (HOSPITAL_COMMUNITY): Payer: Medicaid Other

## 2014-08-08 LAB — GLUCOSE, CAPILLARY
GLUCOSE-CAPILLARY: 181 mg/dL — AB (ref 70–99)
GLUCOSE-CAPILLARY: 209 mg/dL — AB (ref 70–99)
GLUCOSE-CAPILLARY: 230 mg/dL — AB (ref 70–99)
GLUCOSE-CAPILLARY: 252 mg/dL — AB (ref 70–99)
Glucose-Capillary: 214 mg/dL — ABNORMAL HIGH (ref 70–99)
Glucose-Capillary: 244 mg/dL — ABNORMAL HIGH (ref 70–99)
Glucose-Capillary: 255 mg/dL — ABNORMAL HIGH (ref 70–99)

## 2014-08-08 MED ORDER — INSULIN GLARGINE 100 UNIT/ML ~~LOC~~ SOLN
60.0000 [IU] | Freq: Every day | SUBCUTANEOUS | Status: DC
  Administered 2014-08-08 – 2014-08-14 (×8): 60 [IU] via SUBCUTANEOUS
  Filled 2014-08-08 (×8): qty 0.6

## 2014-08-08 MED ORDER — INSULIN GLARGINE 100 UNIT/ML ~~LOC~~ SOLN
60.0000 [IU] | Freq: Every day | SUBCUTANEOUS | Status: DC
  Administered 2014-08-08 – 2014-08-14 (×6): 60 [IU] via SUBCUTANEOUS
  Filled 2014-08-08 (×9): qty 0.6

## 2014-08-08 MED ORDER — ALBUTEROL SULFATE (2.5 MG/3ML) 0.083% IN NEBU: 2.5000 mg | INHALATION_SOLUTION | RESPIRATORY_TRACT | Status: DC | PRN

## 2014-08-08 NOTE — Progress Notes (Signed)
Speech Language Pathology Daily Session Notes  Patient Details  Name: Richard LoosenJimmie G Freas MRN: [id number] Date of Birth: 10/25/1961  Today's Date: 08/08/2014 SLP Individual Time #1: 1040-1100 SLP Individual Time Calculation (min): 20 min  SLP Individual Time #2: 1530-1540 SLP Individual Time Calculation (min): 10 min  Short Term Goals: Week 2: SLP Short Term Goal 1 (Week 2): Pt will complete pharyngeal strengthening exercise to maximize swallowing function with Mod cues SLP Short Term Goal 2 (Week 2): Patient will recall new, daily information with use of external memory aids with supervision multimodal cues.  SLP Short Term Goal 3 (Week 2): Patient will utilize multimodal communication to express wants/needs with Mod A multimodal cues.  SLP Short Term Goal 4 (Week 2): Patient will demonstrate functional problem solving for basic and familiar tasks with Min A multimodal cues.   Skilled Therapeutic Interventions: Session #1 Skilled treatment focused on cognitive goals. SLP facilitated session with new learning task. Pt required Mod cues from SLP for working memory and basic problem solving in order to complete task. He sustained attention to task for ~15 minutes with no cues for redirection. Pt communicated to therapist about what he has been working on in therapies by writing on his dry erase board with Mod I, with Min cues provided for recall of therapy goals/activities. Pt missed 10 minutes of skilled therapy due to therapist being called into another patient's room for assistance.  Session #2: Pt was seen again this afternoon due to time missed this morning. Treatment focused on cognitive goals with SLP providing supervision level question cues for recall of instructions from new learning task from earlier session. Pt continued to require Mod cues to utilize external memory aid to facilitate functional carryover of instructions, however with only Min cues for basic problem solving. Pt used  gestures to convey wants/needs throughout session. Continue plan of care.  FIM:  Comprehension Comprehension Mode: Auditory Comprehension: 5-Follows basic conversation/direction: With extra time/assistive device Expression Expression Mode: Nonverbal Expression: 5-Expresses basic 90% of the time/requires cueing < 10% of the time. Social Interaction Social Interaction: 4-Interacts appropriately 75 - 89% of the time - Needs redirection for appropriate language or to initiate interaction. Problem Solving Problem Solving: 4-Solves basic 75 - 89% of the time/requires cueing 10 - 24% of the time Memory Memory: 4-Recognizes or recalls 75 - 89% of the time/requires cueing 10 - 24% of the time  Pain Pain Assessment Pain Assessment: No/denies pain  Therapy/Group: Individual Therapy   Maxcine HamLaura Paiewonsky, M.A. CCC-SLP [phone]  Maxcine Hamaiewonsky, Taeden Geller 08/08/2014, 4:41 PM

## 2014-08-08 NOTE — Progress Notes (Signed)
Inpatient Diabetes Program Recommendations  AACE/ADA: New Consensus Statement on Inpatient Glycemic Control (2013)  Target Ranges:  Prepandial:   less than 140 mg/dL      Peak postprandial:   less than 180 mg/dL (1-2 hours)      Critically ill patients:  140 - 180 mg/dL   Inpatient Diabetes Program Recommendations Insulin - Basal: xxx Correction (SSI): If adding tube feed coverage q 4 hrs, please start with 4-6 units q 4 hrs and decrease corecction to the sensitive scale q 4 hrs. Insulin - Meal Coverage: tube feed coverage for Vital 1.2 at 80 ml/hr containes approx 14 grams per hour or 60 grams per 4 hrs. Recommend coveriing the tube feeds q 4 jrs with 60 grams per 4 hrs. Using a 1 unit per 10 grams carbohydrate, pt would need 5-6 units q 4 hrs  to cover tube feeds only.  Thank you, Lenor CoffinAnn Milessa Hogan, RN, CNS, Diabetes Coordinator [phone])

## 2014-08-08 NOTE — Progress Notes (Signed)
Occupational Therapy Session Note  Patient Details  Name: Cyril LoosenJimmie G Fine MRN: [id number] Date of Birth: 1961-09-24  Today's Date: 08/08/2014 OT Individual Time: 1100-1200 OT Individual Time Calculation (min): 60 min    Short Term Goals: Week 2:  OT Short Term Goal 1 (Week 2): LB dressing: supervision with sit<>stand OT Short Term Goal 2 (Week 2): Simple HM tasks: Min assist with LRAD to gather clothes prior to B/D and assist with clean up OT Short Term Goal 3 (Week 2): Pt will perform bathing at supervision level with sit<>stand  Skilled Therapeutic Interventions/Progress Updates:    Pt engaged in bathing and dressing tasks with sit<>stand from recliner to focus on activity tolerance, sit<>stand, dynamic standing balance, transfers, and safety awareness.  Pt requested use of BSC X 3 and performed transfers with supervision (assistance to manage feeding tube and O2 tubing). Pt required assistance to bathe feet and declined use of long handle sponge to assist.  Pt requires max encouragement to engage in activities/tasks other than bathing an dressing.  Pt continues to decline wearing his clothing, preferring to wear hospital gowns.  Therapy Documentation Precautions:  Precautions Precautions: Fall Precaution Comments: h/o falls PTA per chart, trach and O2 Restrictions Weight Bearing Restrictions: No Pain: Pain Assessment Pain Assessment: No/denies pain Pain Score: 0-No pain  See FIM for current functional status  Therapy/Group: Individual Therapy  [name], Omarr Hann Chappell 08/08/2014, 12:09 PM

## 2014-08-08 NOTE — Progress Notes (Signed)
Name: Richard LoosenJimmie G Aamodt MRN: [id number] DOB: 1961/06/02    ADMISSION DATE:  07/26/2014 CONSULTATION DATE:  07/28/14  REFERRING MD :  Hermelinda MedicusSchwartz   CHIEF COMPLAINT:  Trach mgmt   BRIEF PATIENT DESCRIPTION: 53 y/o male with hx dCHF, COPD, DM, CKD [facility] with AMS, hyperkalemia and developed VF/VT arrest.  Course was c/b failed extubation requiring immediate re-intubation (now s/p trach), CAD for which he refused cath, volume overload, and significant dysphagia.  [facility] for further rehab efforts.  He cont to have difficulties with dysphagia, increased secretions and low grade fevers and PCCM asked to re-eval.   SIGNIFICANT EVENTS  10/6- secretion increased  STUDIES:  FEES 10/1>> severe dysphagia with silent aspiration  10/6 sputum>> neg   SUBJECTIVE:  No acute events.  Continues to have problems with secretions and pt reports that he is not really feeling any better.   VITAL SIGNS: Temp:  [98.8 F (37.1 C)-100.2 F (37.9 C)] 98.9 F (37.2 C) (10/12 0445) Pulse Rate:  [75-92] 92 (10/12 0804) Resp:  [16-19] 18 (10/12 0804) BP: (120-137)/(58-78) 120/78 mmHg (10/12 0343) SpO2:  [92 %-98 %] 98 % (10/12 0957) FiO2 (%):  [28 %-30 %] 30 % (10/12 0957) Weight:  [96.5 kg (212 lb 11.9 oz)] 96.5 kg (212 lb 11.9 oz) (10/12 0445)  PHYSICAL EXAMINATION: General:  Chronically ill appearing male, NAD. Sitting in chair. PMV off Neuro:  Alert and interactive, MAE HEENT:  [place]/AT, PERRLA, EOM-I and MMM.  Trach site with clear secretions noted. Cardiovascular:  RRR, no M/R/G. Lungs:  Diminished bilaterally.  Coarse rhonchi scattered. Abdomen:  Soft, NT, ND and +BS. Musculoskeletal: 2-3+ pitting edema, generalized and -tenderness. Skin:  Intact.   PULMONARY No results found for this basename: PHART, PCO2, PCO2ART, PO2, PO2ART, HCO3, TCO2, O2SAT,  in the last 168 hours  CBC  Recent Labs Lab 08/03/14 0555  HGB 11.2*  HCT  34.6*  WBC 9.5  PLT 362    COAGULATION No results found for this basename: INR,  in the last 168 hours  CARDIAC  No results found for this basename: TROPONINI,  in the last 168 hours  Recent Labs Lab 08/02/14 1105  PROBNP 471.5*     CHEMISTRY  Recent Labs Lab 08/03/14 0555  NA 139  K 3.4*  CL 95*  CO2 32  GLUCOSE 192*  BUN 15  CREATININE 0.67  CALCIUM 8.4  MG 1.3*  PHOS 3.1   Estimated Creatinine Clearance: 106.6 ml/min (by C-G formula based on Cr of 0.67).   LIVER No results found for this basename: AST, ALT, ALKPHOS, BILITOT, PROT, ALBUMIN, INR,  in the last 168 hours   INFECTIOUS No results found for this basename: LATICACIDVEN, PROCALCITON,  in the last 168 hours   ENDOCRINE CBG (last 3)   Recent Labs  08/08/14 0036 08/08/14 0357 08/08/14 0801  GLUCAP 244* 255* 230*    IMAGING x48h No results found.   No results found.  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  Failed extubation 9/23 with stridor requiring immediate reintubation. Now tol ATC at all times, #6 cuffed shiley.   REC -  Albuterol PRN. Hold off on  change to cuffless trach for now with continued secretions and risk for occult aspiration Would not downsize or place PMV until secretion status resolved Pulm hygiene   Keep dry as tolerated Cont abx per primary for extensive sinus disease (Augmentin)  Continue scopolamine patch Continue feeding via panda  CXR intermittently  Chronic diastolic CHF  Volume Overload   REC -  Continue lasix 20 QD F/u chem  F/u BNP   Discussion: Continues to have secretions therefore would recommend continue with #6 cuffed shiley with cuff up. No downsize until secretions improve.   Rutherford Guysahul Desai, PA -  C [place] Pgr: [phone]  or [phone]  Levy Pupaobert Velmer Woelfel, MD, PhD 08/08/2014, 12:15 PM [facility] [phone] or if no answer [phone]

## 2014-08-08 NOTE — Progress Notes (Signed)
Physical Therapy Session Note  Patient Details  Name: Cyril LoosenJimmie G Lepak MRN: [id number] Date of Birth: [birth year]  Skilled Therapeutic Interventions/Progress Updates:  Attempted to see pt for make up tx session, however, pt repeatedly shaking head no. Pt req encouragement to utilize communication board eventually writing, "I just don't want to. I was just changed and got comfortable." Pt with continued refusal despite encouragement and education regarding role/beneift of physical therapy. Pt left sitting in recliner with all needs in reach.   Therapy Documentation Precautions:  Precautions Precautions: Fall Precaution Comments: h/o falls PTA per chart, trach and O2 Restrictions Weight Bearing Restrictions: No  See FIM for current functional status  Therapy/Group: Individual Therapy  [name], Anshul Meddings S 08/08/2014, 4:41 PM

## 2014-08-08 NOTE — Progress Notes (Signed)
[facility] PHYSICAL MEDICINE & REHABILITATION     PROGRESS NOTE     Subjective/Complaints: Stomach a little sore but otherwise doing well.  A  review of systems has been performed and if not noted above is otherwise negative.   Objective: Vital Signs: Blood pressure 120/78, pulse 92, temperature 98.9 F (37.2 C), temperature source Oral, resp. rate 18, height 5\' 2"  (1.575 m), weight 96.5 kg (212 lb 11.9 oz), SpO2 98.00%. No results found. No results found for this basename: WBC, HGB, HCT, PLT,  in the last 72 hours No results found for this basename: NA, K, CL, CO, GLUCOSE, BUN, CREATININE, CALCIUM,  in the last 72 hours CBG (last 3)   Recent Labs  08/08/14 0036 08/08/14 0357 08/08/14 0801  GLUCAP 244* 255* 230*    Wt Readings from Last 3 Encounters:  08/08/14 96.5 kg (212 lb 11.9 oz)  07/26/14 93 kg (205 lb 0.4 oz)  07/26/14 93 kg (205 lb 0.4 oz)    Physical Exam:  Constitutional:  No acute distress.  HENT: orlal mucosa pink/moist  Head: Normocephalic.  Eyes: EOM are normal.  Neck:  #6 cuffed trach in place--minimal secretions thru trach  Cardiac rate controlled. No murmurs. Reg rhythm  Respiratory:  No distress. No audible breath sounds.   rhonchi on exam.  GI: Soft. Bowel sounds are normal. He exhibits no distension. G-tube site clean Neurological: .  Patient is able to provide his name, age and date of birth. Follows simple commands. CN exam grossly intact. UE: 3+ delt, 4/5 bicep/tricep/HI. LE's: 3/5 HF, 3+KE, 4- ankles. No gross sensory changes. Good sitting balance.  Psychiatric:  Flat. Appears alert Skin: a few scattered echhymoses on limbs still   Assessment/Plan: 1. Functional deficits secondary to severe deconditioning after respiratory failure which require 3+ hours per day of interdisciplinary therapy in a comprehensive inpatient rehab setting. Physiatrist is providing close team supervision and 24 hour management of active medical problems listed  below. Physiatrist and rehab team continue to assess barriers to discharge/monitor patient progress toward functional and medical goals.  FIM: FIM - Bathing Bathing Steps Patient Completed: Chest;Right Arm;Left Arm;Abdomen;Front perineal area;Buttocks;Right upper leg;Left upper leg Bathing: 4: Min-Patient completes 8-9 461f 10 parts or 75+ percent  FIM - Upper Body Dressing/Undressing Upper body dressing/undressing: 0: Wears gown/pajamas-no public clothing FIM - Lower Body Dressing/Undressing Lower body dressing/undressing steps patient completed: Don/Doff right sock;Don/Doff left sock Lower body dressing/undressing: 0: Wears gown/pajamas-no public clothing  FIM - Toileting Toileting steps completed by patient: Performs perineal hygiene;Adjust clothing prior to toileting;Adjust clothing after toileting Toileting Assistive Devices: Grab bar or rail for support Toileting: 5: Supervision: Safety issues/verbal cues  FIM - Diplomatic Services operational officerToilet Transfers Toilet Transfers Assistive Devices: Bedside commode Toilet Transfers: 4-To toilet/BSC: Min A (steadying Pt. > 75%);4-From toilet/BSC: Min A (steadying Pt. > 75%)  FIM - Bed/Chair Transfer Bed/Chair Transfer Assistive Devices: Arm rests Bed/Chair Transfer: 0: Activity did not occur  FIM - Locomotion: Wheelchair Distance: 100 Locomotion: Wheelchair: 0: Activity did not occur FIM - Locomotion: Ambulation Locomotion: Ambulation Assistive Devices: Designer, industrial/productWalker - Rolling Ambulation/Gait Assistance: 4: Min guard;1: +2 Total assist (for line management) Locomotion: Ambulation: 0: Activity did not occur  Comprehension Comprehension Mode: Auditory Comprehension: 5-Follows basic conversation/direction: With extra time/assistive device  Expression Expression Mode: Nonverbal Expression Assistive Devices: 6-Communication board Expression: 5-Expresses basic needs/ideas: With extra time/assistive device  Social Interaction Social Interaction: 5-Interacts  appropriately 90% of the time - Needs monitoring or encouragement for participation or interaction.  Problem Solving  Problem Solving: 5-Solves basic 90% of the time/requires cueing < 10% of the time  Memory Memory: 5-Recognizes or recalls 90% of the time/requires cueing < 10% of the time  Medical Problem List and Plan:  1. Functional deficits secondary to debilitation after cardiac arrest/respiratory failure.  2. 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   -continue #6 cuffed trach for now with cuff up per CCS  -IS/FV  -pulmonary toilet  -continue augmentin  -afebrile    2. DVT Prophylaxis/Anticoagulation: Subcutaneous heparin. Monitor platelet counts and any signs of bleeding  3. Pain Management: Tylenol as needed  4.  Severe Oral-pharyngeal --   -after long discussion with patient, he's agreed to G-tube   -G-tube placed---appreciate IR's help!  -tolerating TF 5. Neuropsych: This patient is capable of making decisions on his own behalf.   -appreciate neuropsych eval/recs---agree--initiate lexapro qhs 6. Skin/Wound Care: Routine skin care. Trach care as directed  7. Fluids/Electrolytes/Nutrition:  Following weights and I's and o's  8. CAD/CABG. Patient has refused cardiac catheterization in the past  9. Diastolic congestive heart failure. Continue Lasix as directed. Monitor for any signs of fluid overload  10. Diabetes mellitus with peripheral neuropathy.   -increase lantus to 60u qam and 60u qpm   11. Tobacco abuse. Provide counseling   12. Chronic renal insufficiency. Baseline creatinine 1.5-2.   13. Extensive sinus disease left mastoid. Continue Augmentin for time being        LOS (Days) 13 A FACE TO FACE EVALUATION WAS PERFORMED  SWARTZ,ZACHARY T 08/08/2014 8:17 AM

## 2014-08-08 NOTE — Progress Notes (Signed)
Physical Therapy Session Note  Patient Details  Name: Richard [name] MRN: [id number] Date of Birth: 02-17-1961  Today's Date: 08/08/2014 PT Individual Time: Treatment Session 1: 2440-1027; Treatment Session 2: 2536-6440 PT Individual Time Calculation (min): Treatment Session 1: 45 min; Treatment Session 2: 63min  Short Term Goals: Week 1:  PT Short Term Goal 1 (Week 1): Pt will perform all aspects of bed mobility at min A level with HOB flat and without rails.  PT Short Term Goal 1 - Progress (Week 1): Discontinued (comment) (Pt reports that he sleeps in a recliner) PT Short Term Goal 2 (Week 1): Pt will perform stand pivot transfers at S level PT Short Term Goal 2 - Progress (Week 1): Met PT Short Term Goal 3 (Week 1): Pt will ambulate x 50' w/ LRAD at S level with SaO2 >90%.   PT Short Term Goal 3 - Progress (Week 1): Progressing toward goal  Skilled Therapeutic Interventions/Progress Updates:  Treatment Session 1:  1:1. Pt received sitting in recliner lightly sleeping, but easy to wake. Immediately upon seeing therapist, pt shaking head no to indicate not wanting to participate in therapy. Pt would not further communicate as to why with gestures or communication board. Therapist providing max encouragement as well as education for benefits of participation in session while preparing for mobility outside of room. Pt reluctant, but agreeable to participate. Focus this session on functional endurance during ambulation and transfers. Pt req close(S) for ambulation 250'x1, 150'x1 and 175'x1 with RW, cues for improved posture and decreased pace for improved endurance. Pt req supervision for safe completion of car transfer with min cues for hand placement. Pt stating need to use bathroom at end of session, amb back to room, req supervision/setup for toileting with use of BSC. Pt req frequent and prolong seated rest throughout session due to fatigue as well as for management of secretions. Pt able  to maintain SaO2 >97% on 4L at 30% via trach collar. Pt left sitting in recliner at end of session w/ all needs in reach.   Treatment Session 2:  1:1. Pt received sitting in w/c, agreeable to therapy with min encouragement. Focus this session on functional endurance during ambulation and stair negotiation as well as therex. Pt req supervision for toileting at start and end of session with use of BSC.   Pt practiced negotiation up/down 4 steps w/ single R rail, using step to pattern, req min guard A. Pt amb 175'x2 with RW and close(S) as therapist managing multiple lines. Pt continues to req intermittent cues for posture and decreased pace for improved endurance.   Pt with good tolerance to NuStep to target B UE/LE strength and endurance, level 4x29min. Pt cued to keep pace in 60s-low 70s for endurance. Borg rating 12-13.   Pt req prolong seated rest breaks during session due to fatigue and secretion management. Pt able to maintain SaO2 >98% on 4L at 30% via trach collar. Pt with ineffective use of gestures for communication at times, req encouragement to use communication board. Pt left sitting in recliner at end of session with all needs in reach.   Therapy Documentation Precautions:  Precautions Precautions: Fall Precaution Comments: h/o falls PTA per chart, trach and O2 Restrictions Weight Bearing Restrictions: No  Therapy Vitals Pulse Rate: 92 Resp: 18 Oxygen Therapy SpO2: 98 % O2 Device: Trach collar O2 Flow Rate (L/min): 4 L/min FiO2 (%): 30 % Pulse Oximetry Type: Intermittent  See FIM for current functional status  Therapy/Group: Individual  Therapy  Gilmore Laroche 08/08/2014, 9:58 AM

## 2014-08-09 ENCOUNTER — Inpatient Hospital Stay (HOSPITAL_COMMUNITY): Payer: Medicaid Other

## 2014-08-09 ENCOUNTER — Encounter (HOSPITAL_COMMUNITY): Payer: Medicaid Other

## 2014-08-09 ENCOUNTER — Inpatient Hospital Stay (HOSPITAL_COMMUNITY): Payer: Medicaid Other | Admitting: Speech Pathology

## 2014-08-09 LAB — GLUCOSE, CAPILLARY
GLUCOSE-CAPILLARY: 168 mg/dL — AB (ref 70–99)
GLUCOSE-CAPILLARY: 203 mg/dL — AB (ref 70–99)
Glucose-Capillary: 230 mg/dL — ABNORMAL HIGH (ref 70–99)
Glucose-Capillary: 228 mg/dL — ABNORMAL HIGH (ref 70–99)
Glucose-Capillary: 187 mg/dL — ABNORMAL HIGH (ref 70–99)

## 2014-08-09 NOTE — Progress Notes (Signed)
[facility] PHYSICAL MEDICINE & REHABILITATION     PROGRESS NOTE     Subjective/Complaints: Able to sleep. No substantial changes over night. Refused therapy yesterday.  A  review of systems has been performed and if not noted above is otherwise negative.   Objective: Vital Signs: Blood pressure 129/64, pulse 83, temperature 99.6 F (37.6 C), temperature source Oral, resp. rate 18, height 5\' 2"  (1.575 m), weight 96.344 kg (212 lb 6.4 oz), SpO2 96.00%. No results found. No results found for this basename: WBC, HGB, HCT, PLT,  in the last 72 hours No results found for this basename: NA, K, CL, CO, GLUCOSE, BUN, CREATININE, CALCIUM,  in the last 72 hours CBG (last 3)   Recent Labs  08/08/14 2026 08/08/14 2350 08/09/14 0418  GLUCAP 252* 209* 230*    Wt Readings from Last 3 Encounters:  08/09/14 96.344 kg (212 lb 6.4 oz)  07/26/14 93 kg (205 lb 0.4 oz)  07/26/14 93 kg (205 lb 0.4 oz)    Physical Exam:  Constitutional:  No acute distress.  HENT: orlal mucosa pink/moist  Head: Normocephalic.  Eyes: EOM are normal.  Neck:  #6 cuffed trach in place--minimal secretions thru trach  Cardiac rate controlled. No murmurs. Reg rhythm  Respiratory:  No distress. No audible breath sounds.   rhonchi on exam.  GI: Soft. Bowel sounds are normal. He exhibits no distension. G-tube site clean Neurological: .  Patient is able to provide his name, age and date of birth. Follows simple commands. CN exam grossly intact. UE: 3+ delt, 4/5 bicep/tricep/HI. LE's: 3/5 HF, 3+KE, 4- ankles. No gross sensory changes. Good sitting balance.  Psychiatric:  Flat. Appears alert Skin: a few scattered echhymoses on limbs still   Assessment/Plan: 1. Functional deficits secondary to severe deconditioning after respiratory failure which require 3+ hours per day of interdisciplinary therapy in a comprehensive inpatient rehab setting. Physiatrist is providing close team supervision and 24 hour management of  active medical problems listed below. Physiatrist and rehab team continue to assess barriers to discharge/monitor patient progress toward functional and medical goals.  Team conference today. May need placement  FIM: FIM - Bathing Bathing Steps Patient Completed: Chest;Right Arm;Left Arm;Abdomen;Front perineal area;Buttocks;Right upper leg;Left upper leg Bathing: 4: Min-Patient completes 8-9 6665f 10 parts or 75+ percent  FIM - Upper Body Dressing/Undressing Upper body dressing/undressing: 0: Wears gown/pajamas-no public clothing FIM - Lower Body Dressing/Undressing Lower body dressing/undressing steps patient completed: Don/Doff right sock;Don/Doff left sock Lower body dressing/undressing: 0: Wears gown/pajamas-no public clothing  FIM - Toileting Toileting steps completed by patient: Performs perineal hygiene Toileting Assistive Devices: Grab bar or rail for support Toileting: 5: Supervision: Safety issues/verbal cues  FIM - Diplomatic Services operational officerToilet Transfers Toilet Transfers Assistive Devices: PsychiatristBedside commode Toilet Transfers: 5-To toilet/BSC: Supervision (verbal cues/safety issues);5-From toilet/BSC: Supervision (verbal cues/safety issues)  FIM - Press photographerBed/Chair Transfer Bed/Chair Transfer Assistive Devices: Arm rests;Walker Bed/Chair Transfer: 5: Bed > Chair or W/C: Supervision (verbal cues/safety issues);5: Chair or W/C > Bed: Supervision (verbal cues/safety issues)  FIM - Locomotion: Wheelchair Distance: 100 Locomotion: Wheelchair: 0: Activity did not occur FIM - Locomotion: Ambulation Locomotion: Ambulation Assistive Devices: Designer, industrial/productWalker - Rolling Ambulation/Gait Assistance: 5: Supervision Locomotion: Ambulation: 5: Travels 150 ft or more with supervision/safety issues  Comprehension Comprehension Mode: Auditory Comprehension: 5-Follows basic conversation/direction: With extra time/assistive device  Expression Expression Mode: Nonverbal Expression Assistive Devices: 6-Communication  board Expression: 5-Expresses basic 90% of the time/requires cueing < 10% of the time.  Social Interaction Social Interaction: 4-Interacts appropriately 75 -  89% of the time - Needs redirection for appropriate language or to initiate interaction.  Problem Solving Problem Solving: 4-Solves basic 75 - 89% of the time/requires cueing 10 - 24% of the time  Memory Memory: 4-Recognizes or recalls 75 - 89% of the time/requires cueing 10 - 24% of the time  Medical Problem List and Plan:  1. Functional deficits secondary to debilitation after cardiac arrest/respiratory failure.  2. 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   -continue #6 cuffed trach for now with cuff up per CCS  -IS/FV  -pulmonary toilet  -continue augmentin  -has ongoing low grade temp    2. DVT Prophylaxis/Anticoagulation: Subcutaneous heparin. Monitor platelet counts and any signs of bleeding  3. Pain Management: Tylenol as needed  4.  Severe Oral-pharyngeal --   -G-tube placed and he's tolerating TF 5. Neuropsych: This patient is capable of making decisions on his own behalf.   -  lexapro qhs for reactive derpession 6. Skin/Wound Care: Routine skin care. Trach care as directed  7. Fluids/Electrolytes/Nutrition:  Following weights and I's and o's  8. CAD/CABG. Patient has refused cardiac catheterization in the past  9. Diastolic congestive heart failure. Continue Lasix as directed.     -weights trending back up. Doesn't appear fluid overloaded---observe for now 10. Diabetes mellitus with peripheral neuropathy.   -increased lantus to 60u qam and 60u qpm with some improvement---continue to follow   11. Tobacco abuse. Provide counseling   12. Chronic renal insufficiency. Baseline creatinine 1.5-2.   13. Extensive sinus disease left mastoid. Continue Augmentin for time being        LOS (Days) 14 A FACE TO FACE EVALUATION WAS PERFORMED  Koralynn Greenspan T 08/09/2014 8:17 AM

## 2014-08-09 NOTE — Progress Notes (Signed)
Occupational Therapy Note  Patient Details  Name: Richard [name] MRN: [id number] Date of Birth: 01/13/1961    Pt missed 30 mins skilled OT services.  Pt refused/declined participation in therapy session.  Pt unwilling to amb with RW.  Multiple attempts with continual education on importance of therapy.  Pt did agree to participate in later PT session.    Lavone NeriLanier, Leobardo Granlund [facility] CenterChappell 08/09/2014, 1:23 PM

## 2014-08-09 NOTE — Progress Notes (Signed)
Inpatient Diabetes Program Recommendations  AACE/ADA: New Consensus Statement on Inpatient Glycemic Control (2013)  Target Ranges:  Prepandial:   less than 140 mg/dL      Peak postprandial:   less than 180 mg/dL (1-2 hours)      Critically ill patients:  140 - 180 mg/dL   Results for [name], Brandley G (MRN [id number]) as of 08/09/2014 10:01  Ref. Range 08/08/2014 03:57 08/08/2014 08:01 08/08/2014 12:00 08/08/2014 16:12 08/08/2014 20:26 08/08/2014 23:50 08/09/2014 04:18 08/09/2014 09:05  Glucose-Capillary Latest Range: 70-99 mg/dL 782255 (H) 956230 (H) 213181 (H) 214 (H) 252 (H) 209 (H) 230 (H) 228 (H)   Diabetes history: DM2 Outpatient Diabetes medications: Lantus 40 units BID, Novolog 0-20 units Q4H Current orders for Inpatient glycemic control: Lantus 60 units BID, Novolog 0-20 units Q4H  Inpatient Diabetes Program Recommendations Insulin - Tube Feeding Coverage: Please consider ordering Novolog 5 units Q4H for tube feeding coverage to improve glycemic control.  Note: Over the past 24 hours CBGs have ranged from 181-252 mg/dl despite Lantus being increased to 60 units BID. Patient is ordered Vital at 85 ml/hr which is contributing to hyperglycemia. Please consider ordering Novolog 5 units Q4H for tube feeding coverage to improve glycemic control.    Thanks, Orlando PennerMarie Carlota Philley, RN, MSN, CCRN Diabetes Coordinator Inpatient Diabetes Program [phone] (Team Pager) [phone] ([facility] office) [phone] [facility] office)

## 2014-08-09 NOTE — Progress Notes (Signed)
Social Work Patient ID: Richard [name], male   DOB: 1961/07/24, 53 y.o.   MRN: [id number]  Attempted to speak with pt earlier today to (again) discuss option of SNF as his care needs are likely greater than his nephew or sister can provide.  Pt still shakes his head "no".  Contacted sister to discuss further and she is in full agreement that pt's care is too much for them to care for at home.  She plans to come with pt's nephew to the hospital tomorrow to explain this to pt.  I will be present for this discussion as well.  Godwin Tedesco, LCSW

## 2014-08-09 NOTE — Progress Notes (Addendum)
Speech Language Pathology Daily Session Note  Patient Details  Name: Richard [name] MRN: [id number] Date of Birth: [birth year]  Today's Date: 08/09/2014 SLP Individual Time: [id number] SLP Individual Time Calculation (min): 30 min  Short Term Goals: Week 2: SLP Short Term Goal 1 (Week 2): Pt will complete pharyngeal strengthening exercise to maximize swallowing function with Mod cues SLP Short Term Goal 2 (Week 2): Patient will recall new, daily information with use of external memory aids with supervision multimodal cues.  SLP Short Term Goal 3 (Week 2): Patient will utilize multimodal communication to express wants/needs with Mod A multimodal cues.  SLP Short Term Goal 4 (Week 2): Patient will demonstrate functional problem solving for basic and familiar tasks with Min A multimodal cues.   Skilled Therapeutic Interventions:  Pt was seen for skilled speech therapy targeting cognitive-linguistic goals.  Upon arrival, pt was seated upright in chair, awake, alert, and agreeable to participate in ST with encouragement.  Pt indicated toileting needs via gestures/pointing with supervision question cues; however, he required mod assist cues to use white board to indicate more abstract and/or complex needs and wants. SLP facilitated the session with guided questions related to previous therapy sessions targeting recall of daily information, with pt able to recall at least 3-5 goals/structured therapeutic activities from previous sessions with min assist.  SLP provided min assist for use of therapy schedule to improve prospective memory of daily events, including upcoming PM therapy sessions.  Continue per current plan of care.    FIM:  Comprehension Comprehension Mode: Auditory Comprehension: 5-Follows basic conversation/direction: With extra time/assistive device Expression Expression Mode: Nonverbal Expression Assistive Devices: 6-Communication board Expression: 5-Expresses basic 90% of the  time/requires cueing < 10% of the time. Social Interaction Social Interaction: 4-Interacts appropriately 75 - 89% of the time - Needs redirection for appropriate language or to initiate interaction. Problem Solving Problem Solving: 4-Solves basic 75 - 89% of the time/requires cueing 10 - 24% of the time Memory Memory: 4-Recognizes or recalls 75 - 89% of the time/requires cueing 10 - 24% of the time  Pain Pain Assessment Pain Assessment: No/denies pain  Therapy/Group: Individual Therapy  Jackalyn LombardNicole Jady Braggs, M.A. CCC-SLP  Shayona Hibbitts, Melanee SpryNicole L 08/09/2014, 4:25 PM

## 2014-08-09 NOTE — Progress Notes (Signed)
Occupational Therapy Session Note  Patient Details  Name: Richard LoosenJimmie G [name] MRN: [id number] Date of Birth: [birth year]  Today's Date: 08/09/2014 OT Individual Time: 0800-0900 OT Individual Time Calculation (min): 60 min    Short Term Goals: Week 2:  OT Short Term Goal 1 (Week 2): LB dressing: supervision with sit<>stand OT Short Term Goal 2 (Week 2): Simple HM tasks: Min assist with LRAD to gather clothes prior to B/D and assist with clean up OT Short Term Goal 3 (Week 2): Pt will perform bathing at supervision level with sit<>stand  Skilled Therapeutic Interventions/Progress Updates:    Pt resting in recliner upon arrival.  Pt indicated he needed to use toilet.  Attempted to have pt amb with RW to bathroom but patient refused stating he wanted to use BSC positioned beside chair.  Pt performed transfers with supervision requiring assistance managing O2 tubes and pulseox wires.  Pt completed bathing and dressing tasks requiring assistance to don socks.  Pt utilized long handle sponge to bathe feet.  Focus on activity tolerance, dynamic standing balance, safety awareness, and transfers.  Therapy Documentation Precautions:  Precautions Precautions: Fall Precaution Comments: h/o falls PTA per chart, trach and O2 Restrictions Weight Bearing Restrictions: No Pain: Pain Assessment Pain Assessment: No/denies pain  See FIM for current functional status  Therapy/Group: Individual Therapy  [name], Richard [name] 08/09/2014, 9:02 AM

## 2014-08-09 NOTE — Progress Notes (Addendum)
Physical Therapy Session Note  Patient Details  Name: Richard LoosenJimmie G Forgey MRN: [id number] Date of Birth: 11-14-1960  Today's Date: 08/09/2014 PT Individual Time: 1530-1620 PT Individual Time Calculation (min): 50 min   Short Term Goals: Week 2:  PT Short Term Goal 1 (Week 2): Pt to ambulate 150' with LRAD, supervision and SaO2>90% PT Short Term Goal 2 (Week 2): Pt to negotiate up/down 5 steps with B rails and supervision PT Short Term Goal 3 (Week 2): Pt to tolerate standing activity for 15min with LRAD, supervision and SaO2 >50%  Skilled Therapeutic Interventions/Progress Updates:  1:1. Pt received sitting in recliner, ready for therapy. Focus this session on functional endurance during standing and ambulation as well as therex. Pt req supervision for all t/f sit<>stand with cues for hand placement. Pt with good tolerance to amb 180'x2 with RW and close(S), tactile cues for posture due to trunk flexion. Pt with good tolerance to standing 10min and 5min while engaged in game of checkers, supervision for balance overall. Pt utilized NuStep to target B UE/LE strength and endurance, level 4-5x4812min with good tolerance, cues to keep pace in 60s/low 70s for increased endurance. Supervision for toileting at end of session with use of BSC. Pt req intermittent seated rest due to fatigue and secretion management.   Therapy Documentation Precautions:  Precautions Precautions: Fall Precaution Comments: h/o falls PTA per chart, trach and O2 Restrictions Weight Bearing Restrictions: No Vital Signs: Therapy Vitals Pulse Rate: 74 Resp: 18 Patient Position (if appropriate): Sitting Oxygen Therapy SpO2: 94 % O2 Device: Trach collar O2 Flow Rate (L/min): 4 L/min FiO2 (%): 30 %  See FIM for current functional status  Therapy/Group: Individual Therapy  [name], Toni Hoffmeister S 08/09/2014, 5:16 PM

## 2014-08-09 NOTE — Patient Care Conference (Signed)
Inpatient RehabilitationTeam Conference and Plan of Care Update Date: 08/09/2014   Time: 2:45 PM    Patient Name: Richard LoosenJimmie G [name]      Medical Record Number: [id number]  Date of Birth: 01/26/1961 Sex: Male         Room/Bed: 4W16C/4W16C-01 Payor Info: Payor: MEDICAID [place] / Plan: MEDICAID [place] ACCESS / Product Type: *No Product type* /    Admitting Diagnosis: Deconditioned after cardiac arrest  resp failure   Admit Date/Time:  07/26/2014  6:56 PM Admission Comments: No comment available   Primary Diagnosis:  <principal problem not specified> Principal Problem: <principal problem not specified>  Patient Active Problem List   Diagnosis Date Noted  . Tracheitis 07/28/2014  . Tracheostomy status 07/28/2014  . Debilitated 07/26/2014  . Acute on chronic kidney failure 07/11/2014  . Hyperkalemia 07/11/2014  . CHF (congestive heart failure) 07/11/2014  . DM (diabetes mellitus) 07/11/2014  . CAD (coronary artery disease) of artery bypass graft 07/11/2014  . HTN (hypertension) 07/11/2014  . COPD (chronic obstructive pulmonary disease) 07/11/2014  . GERD (gastroesophageal reflux disease) 07/11/2014  . Acute encephalopathy 07/11/2014  . CAP (community acquired pneumonia) 07/11/2014  . Cardiac arrest 07/11/2014    Expected Discharge Date: Expected Discharge Date:  (SNF)  Team Members Present: Physician leading conference: Dr. Faith RogueZachary Swartz Social Worker Present: Amada JupiterLucy Tere Mcconaughey, LCSW Nurse Present: Carlean PurlMaryann Barbour, RN PT Present: Cyndia SkeetersBridgett Ripa, PT;Caroline Pia MauKing, PT;Audra Hall, PT OT Present: Ardis Rowanom Lanier, COTA;Jennifer Fredrich RomansSmith, OT;Kayla Perkinson, OT SLP Present: Other (comment) Virl Axe(Laura P., ST) PPS Coordinator present : Tora DuckMarie Noel, RN, CRRN     Current Status/Progress Goal Weekly Team Focus  Medical   no changes in trach. g-tube functioning  see prior  breathing, nutrition   Bowel/Bladder   pt continent of bowel and bladder- used bsc  remain continent of bowel and bladder  contiune use  to bsc and regular bms   Swallow/Nutrition/ Hydration   NPO  supervision with pharyngeal strengthening exercises  pharyngeal strengthening exercises   ADL's   min A bathing; continues to decline wearing clothes; functional transfers - supervision; max encourgement to participate other than B&D  supervision overall  activity tolernance, transfers, safety awareness, participation   Mobility   Close(S) due to management of lines by therapist for standing mobility consistently >150' w/ RW; frequent rest breaks due to fatigue and secretion managmenent  Supervision overall   activity tolerance, functional endurance during ambulation and stair negotiation, safety awarness during functional transfers and mobility, strength, balance   Communication   Min for multimodal communication, however Max encouragement to utilize alternative means of communicating  Supervision  increase use of multimodal communication   Safety/Cognition/ Behavioral Observations  Min-Mod A  Supervision  recall and carryover of new information   Pain   no complaints of pain         Skin   skin tear RLE- foam inplace- trach inplace  no further skin breakdown   assess skin q shift    Rehab Goals Patient on target to meet rehab goals: Yes *See Care Plan and progress notes for long and short-term goals.  Barriers to Discharge: mood, respirations    Possible Resolutions to Barriers:  supervision and ongoing care at home    Discharge Planning/Teaching Needs:  Per sister and nephew, they cannot provide needed care for pt (also nephew works p/t) and insist plan must be changed to SNF.  Will address again with pt and begin bed search process.      Team Discussion:  Expect trach and peg to be longer term needs and family wold have to be able to manage these.  SW to meet with pt and family tomorrow to discuss d/c plan.  Pt seems to resist PT sessions.  Agreeable to OT because motivated for bathing and dsg.  Able to cough up most  secretions, however, still suctioning a couple times /day.    Revisions to Treatment Plan:  Team, pt's sister and nephew in agreement with change of d/c plan to SNF.  To discuss further with pt tomorrow.   Continued Need for Acute Rehabilitation Level of Care: The patient requires daily medical management by a physician with specialized training in physical medicine and rehabilitation for the following conditions: Daily direction of a multidisciplinary physical rehabilitation program to ensure safe treatment while eliciting the highest outcome that is of practical value to the patient.: Yes Daily medical management of patient stability for increased activity during participation in an intensive rehabilitation regime.: Yes Daily analysis of laboratory values and/or radiology reports with any subsequent need for medication adjustment of medical intervention for : Post surgical problems;Pulmonary problems;Other  [name] 08/09/2014, 5:48 PM

## 2014-08-09 NOTE — Progress Notes (Signed)
Discussion with patient's care team to decrease therapy to QD due to decreased activity tolerance to 3 hours of skilled therapy per day.   Denzil Hughesaroline S. Dawit Tankard, PT, DPT

## 2014-08-09 NOTE — Progress Notes (Signed)
NUTRITION FOLLOW-UP  INTERVENTION: Continue TF of Vital AF 1.2 formula per PEG tube at goal rate of 85 ml/hr for 20 hours (in anticipation of therapy) with 200 ml free water every 6 hours.  Tube feeding regimen provides 2040 kcal, 127 grams of protein, and 1378 ml of H2O. Total free water: 2178 ml  Will continue to monitor.  NUTRITION DIAGNOSIS: Inadequate oral intake related to inability to eat as evidenced by NPO status; ongoing  Goal: Pt to meet >/= 90% of their estimated nutrition needs; met  Monitor:  TF tolerance, weight trends, labs  53 y.o. male  Admitting Dx: Weakness  ASSESSMENT: 10/1-Pt admitted from [facility] for hyperkalemia and AMS. Pt has cardiac arrest while undergoing echocardiogram. Pt intubated and had difficulty with extubation. Pt had trach placed 9/24.  Pt failed swallow evaluation 10/1 and now plans to have feeding tube placed and to start enteral nutrition. No signs of fat or muscle depletion.   10/7- Panda tube came out ("coughed out") last night.  Panda tube was not replaced as pt is agreeable to PEG tube placement. TF has been stopped/discontinued for now. Prior to tube coming out RN reports pt has been tolerating his feedings well. Pt with continue NPO status. IR PA to see pt to discuss PEG tube placement procedure. Recommend continuing TF regimen once PEG tube is placed and stable for use.  10/9- PEG tube placed 10/8. RD consulted for TF management and initiation. To start TF once PEG tube if ready and stable for use per IR. Per RN, TF was started at 1100. Discussed with RN about TF regimen.  10/13- Spoke with RN, pt has been tolerating his TF well. Pt with very little to no residuals. Spoke with pt, he reports no abdominal pains or any other problems.  Will continue to monitor.  Height: Ht Readings from Last 1 Encounters:  07/26/14 $RemoveB'5\' 2"'hBqgImnZ$  (1.575 m)    Weight: Wt Readings from Last 1 Encounters:  08/09/14 212 lb 6.4 oz (96.344 kg)     BMI:  Body mass index is 38.84 kg/(m^2).  Re-Estimated Nutritional Needs: Kcal: 2000-2200 Protein: 110-125 grams Fluid: > 2 L/day  Skin: +1 generalized edema, incision on right leg  Diet Order: NPO   Intake/Output Summary (Last 24 hours) at 08/09/14 1013 Last data filed at 08/08/14 2200  Gross per 24 hour  Intake    100 ml  Output    500 ml  Net   -400 ml    Last BM: 10/13  Labs:   Recent Labs Lab 08/03/14 0555  NA 139  K 3.4*  CL 95*  CO2 32  BUN 15  CREATININE 0.67  CALCIUM 8.4  MG 1.3*  PHOS 3.1  GLUCOSE 192*    CBG (last 3)   Recent Labs  08/08/14 2350 08/09/14 0418 08/09/14 0905  GLUCAP 209* 230* 228*    Scheduled Meds: . amoxicillin-clavulanate  1 tablet Per Tube Q12H  . antiseptic oral rinse  7 mL Mouth Rinse QID  . atorvastatin  80 mg Per Tube q1800  . chlorhexidine  15 mL Mouth Rinse BID  . escitalopram  10 mg Per Tube QHS  . free water  200 mL Per Tube 4 times per day  . furosemide  20 mg Per Tube Daily  . heparin  5,000 Units Subcutaneous 3 times per day  . insulin aspart  0-20 Units Subcutaneous 6 times per day  . insulin glargine  60 Units Subcutaneous QHS  . insulin glargine  60 Units Subcutaneous Daily  . pantoprazole sodium  40 mg Per Tube Daily  . scopolamine  1 patch Transdermal Q72H    Continuous Infusions: . feeding supplement (VITAL AF 1.2 CAL) 1,000 mL (08/09/14 3212)    Past Medical History  Diagnosis Date  . CAD (coronary artery disease) of artery bypass graft 07/11/2014  . Cardiac arrest 07/11/2014  . CHF (congestive heart failure) 07/11/2014  . HTN (hypertension) 07/11/2014  . COPD (chronic obstructive pulmonary disease) 07/11/2014  . DM (diabetes mellitus) 07/11/2014  . GERD (gastroesophageal reflux disease) 07/11/2014  . Acute on chronic kidney failure 07/11/2014    History reviewed. No pertinent past surgical history.  Kallie Locks, MS, RD, LDN Pager # [phone] After hours/ weekend pager # [phone]

## 2014-08-10 ENCOUNTER — Inpatient Hospital Stay (HOSPITAL_COMMUNITY): Payer: Medicaid Other | Admitting: Occupational Therapy

## 2014-08-10 ENCOUNTER — Inpatient Hospital Stay (HOSPITAL_COMMUNITY): Payer: Medicaid Other | Admitting: Speech Pathology

## 2014-08-10 ENCOUNTER — Inpatient Hospital Stay (HOSPITAL_COMMUNITY): Payer: Medicaid Other

## 2014-08-10 DIAGNOSIS — J189 Pneumonia, unspecified organism: Secondary | ICD-10-CM

## 2014-08-10 DIAGNOSIS — G9340 Encephalopathy, unspecified: Secondary | ICD-10-CM

## 2014-08-10 DIAGNOSIS — R5381 Other malaise: Secondary | ICD-10-CM

## 2014-08-10 LAB — GLUCOSE, CAPILLARY
GLUCOSE-CAPILLARY: 190 mg/dL — AB (ref 70–99)
Glucose-Capillary: 137 mg/dL — ABNORMAL HIGH (ref 70–99)
Glucose-Capillary: 139 mg/dL — ABNORMAL HIGH (ref 70–99)
Glucose-Capillary: 173 mg/dL — ABNORMAL HIGH (ref 70–99)
Glucose-Capillary: 217 mg/dL — ABNORMAL HIGH (ref 70–99)
Glucose-Capillary: 236 mg/dL — ABNORMAL HIGH (ref 70–99)

## 2014-08-10 NOTE — Progress Notes (Signed)
[facility] PHYSICAL MEDICINE & REHABILITATION     PROGRESS NOTE     Subjective/Complaints: Able to sleep in recliner which is what he does at home  A  review of systems has been performed and if not noted above is otherwise negative.   Objective: Vital Signs: Blood pressure 131/60, pulse 77, temperature 99.3 F (37.4 C), temperature source Oral, resp. rate 18, height 5\' 2"  (1.575 m), weight 96.344 kg (212 lb 6.4 oz), SpO2 98.00%. No results found. No results found for this basename: WBC, HGB, HCT, PLT,  in the last 72 hours No results found for this basename: NA, K, CL, CO, GLUCOSE, BUN, CREATININE, CALCIUM,  in the last 72 hours CBG (last 3)   Recent Labs  08/09/14 2124 08/10/14 0015 08/10/14 0434  GLUCAP 203* 217* 137*    Wt Readings from Last 3 Encounters:  08/09/14 96.344 kg (212 lb 6.4 oz)  07/26/14 93 kg (205 lb 0.4 oz)  07/26/14 93 kg (205 lb 0.4 oz)    Physical Exam:  Constitutional:  No acute distress.  HENT: orlal mucosa pink/moist  Head: Normocephalic.  Eyes: EOM are normal.  Neck:  #6 cuffed trach in place--minimal secretions thru trach  Cardiac rate controlled. No murmurs. Reg rhythm  Respiratory:  No distress. No audible breath sounds.   rhonchi on exam.  GI: Soft. Bowel sounds are normal. He exhibits no distension. G-tube site clean Neurological: .  Patient is able to provide his name, age and date of birth. Follows simple commands. CN exam grossly intact. UE: 4-delt, 4/5 bicep/tricep/HI. LE's: 3/5 HF, 3+KE, 4- ankles. No gross sensory changes. Good sitting balance.  Psychiatric:  Flat. Appears alert Skin: a few scattered echhymoses on limbs still   Assessment/Plan: 1. Functional deficits secondary to severe deconditioning after respiratory failure which require 3+ hours per day of interdisciplinary therapy in a comprehensive inpatient rehab setting. Physiatrist is providing close team supervision and 24 hour management of active medical problems  listed below. Physiatrist and rehab team continue to assess barriers to discharge/monitor patient progress toward functional and medical goals.    FIM: FIM - Bathing Bathing Steps Patient Completed: Chest;Right Arm;Left Arm;Abdomen;Front perineal area;Buttocks;Right upper leg;Left upper leg;Right lower leg (including foot);Left lower leg (including foot) Bathing: 5: Supervision: Safety issues/verbal cues  FIM - Upper Body Dressing/Undressing Upper body dressing/undressing: 0: Wears gown/pajamas-no public clothing FIM - Lower Body Dressing/Undressing Lower body dressing/undressing steps patient completed: Don/Doff right sock;Don/Doff left sock Lower body dressing/undressing: 0: Wears gown/pajamas-no public clothing  FIM - Toileting Toileting steps completed by patient: Performs perineal hygiene Toileting Assistive Devices: Grab bar or rail for support Toileting: 5: Supervision: Safety issues/verbal cues  FIM - Diplomatic Services operational officerToilet Transfers Toilet Transfers Assistive Devices: PsychiatristBedside commode Toilet Transfers: 5-To toilet/BSC: Supervision (verbal cues/safety issues)  FIM - BankerBed/Chair Transfer Bed/Chair Transfer Assistive Devices: Arm rests;Walker Bed/Chair Transfer: 5: Bed > Chair or W/C: Supervision (verbal cues/safety issues);5: Chair or W/C > Bed: Supervision (verbal cues/safety issues)  FIM - Locomotion: Wheelchair Distance: 100 Locomotion: Wheelchair: 0: Activity did not occur FIM - Locomotion: Ambulation Locomotion: Ambulation Assistive Devices: Designer, industrial/productWalker - Rolling Ambulation/Gait Assistance: 5: Supervision Locomotion: Ambulation: 5: Travels 150 ft or more with supervision/safety issues  Comprehension Comprehension Mode: Auditory Comprehension: 5-Follows basic conversation/direction: With no assist  Expression Expression Mode: Nonverbal Expression Assistive Devices: 6-Communication board Expression: 5-Expresses basic 90% of the time/requires cueing < 10% of the time.  Social  Interaction Social Interaction: 4-Interacts appropriately 75 - 89% of the time - Needs redirection  for appropriate language or to initiate interaction.  Problem Solving Problem Solving: 4-Solves basic 75 - 89% of the time/requires cueing 10 - 24% of the time  Memory Memory: 4-Recognizes or recalls 75 - 89% of the time/requires cueing 10 - 24% of the time  Medical Problem List and Plan:  1. Functional deficits secondary to debilitation after cardiac arrest/respiratory failure.  2. 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   -continue #6 cuffed trach for now with cuff up per CCS  -IS/FV  -pulmonary toilet  -continue augmentin  -has ongoing low grade temp    2. DVT Prophylaxis/Anticoagulation: Subcutaneous heparin. Monitor platelet counts and any signs of bleeding  3. Pain Management: Tylenol as needed  4.  Severe Oral-pharyngeal --   -G-tube placed and he's tolerating TF 5. Neuropsych: This patient is capable of making decisions on his own behalf.   -  lexapro qhs for reactive derpession 6. Skin/Wound Care: Routine skin care. Trach care as directed  7. Fluids/Electrolytes/Nutrition:  Following weights and I's and o's  8. CAD/CABG. Patient has refused cardiac catheterization in the past  9. Diastolic congestive heart failure. Continue Lasix as directed.     -weights trending back up. Doesn't appear fluid overloaded---observe for now 10. Diabetes mellitus with peripheral neuropathy.   -increased lantus to 60u qam and 60u qpm with some improvement---continue to follow   11. Tobacco abuse. Provide counseling   12. Chronic renal insufficiency. Baseline creatinine 1.5-2.   13. Extensive sinus disease left mastoid. Continue Augmentin for time being        LOS (Days) 15 A FACE TO FACE EVALUATION WAS PERFORMED  Erick ColaceKIRSTEINS,ANDREW E 08/10/2014 7:38 AM

## 2014-08-10 NOTE — Progress Notes (Signed)
Speech Language Pathology Weekly Progress and Session Note  Patient Details  Name: Richard [name] MRN: [id number] Date of Birth: 09-15-1961  Beginning of progress report period: August 03, 2014 End of progress report period: August 10, 2014  Today's Date: 08/10/2014 SLP Individual Time: 1130-1145 SLP Individual Time Calculation (min): 15 min  Short Term Goals: Week 2: SLP Short Term Goal 1 (Week 2): Pt will complete pharyngeal strengthening exercise to maximize swallowing function with Mod cues SLP Short Term Goal 1 - Progress (Week 2): Met SLP Short Term Goal 2 (Week 2): Patient will recall new, daily information with use of external memory aids with supervision multimodal cues.  SLP Short Term Goal 2 - Progress (Week 2): Progressing toward goal SLP Short Term Goal 3 (Week 2): Patient will utilize multimodal communication to express wants/needs with Mod A multimodal cues.  SLP Short Term Goal 3 - Progress (Week 2): Progressing toward goal SLP Short Term Goal 4 (Week 2): Patient will demonstrate functional problem solving for basic and familiar tasks with Min A multimodal cues.  SLP Short Term Goal 4 - Progress (Week 2): Met    New Short Term Goals: Week 3: SLP Short Term Goal 1 (Week 3): Pt will complete pharyngeal strengthening exercise to maximize swallowing function with Min cues SLP Short Term Goal 2 (Week 3): Pt will recall daily information with use of external memory aids with Min multimodal cues SLP Short Term Goal 3 (Week 3): Pt will utilize multimodal communication to express wants/needs with Mod multimodal cues  Weekly Progress Updates: Patient has made minimal gains and has met 2 of 4 short term goals this reporting period due to improved performance of pharyngeal strengthening exercises and basic problem solving.  Patient continues to require significant cues to initiate use of external aids for recall of information and use of multimodal communication.  Patient  education ongoing and patient's current discharge plan is unknown at this time due to his current medical status. Patient would benefit from continued skilled SLP intervention to maximize functional communication with multimodal communication, cognitive function and swallowing function prior to discharge.   Intensity: Minumum of 1-2 x/day, 30 to 90 minutes Frequency: 3 out of 7 days Duration/Length of Stay: TBD due to SNF placment  Treatment/Interventions: Cueing hierarchy;Dysphagia/aspiration precaution training;Functional tasks;Speech/Language facilitation;Patient/family education;Cognitive remediation/compensation;Internal/external aids;Environmental controls   Daily Session  Skilled Therapeutic Interventions: Skilled treatment session focused on addressing self-care goals. Patient required Mod assist cues to initiate use of white board to indicate more abstract and/or complex needs and wants. SLP also facilitated session Mod cues to utilize external aids to recall previously taught pharyngeal strengthening exercises.  Once patient was able to recall them he required Min cues to accurately complete.  Sister present for session and SLP initiated education regarding dysphagia and communication strategies; however, she needs reinforcement.  SLP also discussed change in frequency to 3 times a week with patient due to current medical restrictions.         FIM:  Comprehension Comprehension Mode: Auditory Comprehension: 5-Follows basic conversation/direction: With extra time/assistive device Expression Expression Mode: Nonverbal Expression Assistive Devices: 6-Communication board Expression: 5-Expresses basic 90% of the time/requires cueing < 10% of the time. Social Interaction Social Interaction: 4-Interacts appropriately 75 - 89% of the time - Needs redirection for appropriate language or to initiate interaction. Problem Solving Problem Solving: 4-Solves basic 75 - 89% of the time/requires  cueing 10 - 24% of the time Memory Memory: 4-Recognizes or recalls 75 - 89% of the  time/requires cueing 10 - 24% of the time General    Pain Pain Assessment Pain Assessment: No/denies pain  Therapy/Group: Individual Therapy  Carmelia Roller., CCC-SLP [phone]  [place] 08/10/2014, 5:26 PM

## 2014-08-10 NOTE — Progress Notes (Signed)
Social Work Patient ID: Richard [name], male   DOB: May 25, 1961, 53 y.o.   MRN: [id number]   Met with pt, nephew and sister earlier today to review d/c plans.  Stressing again to all that team recommending SNF for pt as his medical care needs exceed what family can provide.  As before, pt shakes head "no" and sister immediately begins to angrily confront pt about fact that nephew cannot provide needed care and that he "... Has to do this.  You ain't got no options."  For several minutes pt and sister argued about this (pt writing responses on wipe off board) until pt finally agreed to plan change to SNF.  Family stayed and provided support for a little while longer.  Encouraged him to continue to work with txs while I work on securing a SNF bed.  Will keep team posted on progress with this.  Tache Bobst, LCSW

## 2014-08-10 NOTE — Progress Notes (Addendum)
Occupational Therapy Session Note  Patient Details  Name: Richard LoosenJimmie G Brege MRN: [id number] Date of Birth: August 21, 1961  Today's Date: 08/10/2014 OT Individual Time: 0800-0850 OT Individual Time Calculation (min): 50 min - patient missed last 10 min of session due to fatigue.  Short Term Goals: Week 2:  OT Short Term Goal 1 (Week 2): LB dressing: supervision with sit<>stand OT Short Term Goal 2 (Week 2): Simple HM tasks: Min assist with LRAD to gather clothes prior to B/D and assist with clean up OT Short Term Goal 3 (Week 2): Pt will perform bathing at supervision level with sit<>stand  Skilled Therapeutic Interventions/Progress Updates:  Patient resting in recliner with feet up on a foot rest.  Engaged in self care retraining to include, sponge bath, oral care, wash hair and donn hospital gown (declined to wear clothes).  Patient set up for bath to include sit and stand and use of LH sponge to wash BLEs, patient required assist to don TED hose and gripper sock.  Due to increased edema in BLEs, top of socks were cut to relieve the tight nature of the sock band.  Also, patient performed BLE & BUE exercises while seated for edema management and endurance.  Session ended 10 min early due to fatigue and decreased interest to move from his spot in the recliner.  Therapy Documentation Precautions:  Precautions Precautions: Fall Precaution Comments: h/o falls PTA per chart, trach and O2 Restrictions Weight Bearing Restrictions: No Pain: Denied pain ADL: See FIM for current functional status  Therapy/Group: Individual Therapy  Shalinda Burkholder 08/10/2014, 9:30 AM

## 2014-08-10 NOTE — Progress Notes (Signed)
Physical Therapy Session Note  Patient Details  Name: Richard LoosenJimmie G Arredondo MRN: [id number] Date of Birth: [birth year]  Today's Date: 08/10/2014 PT Individual Time: 1010-1045 PT Individual Time Calculation (min): 35 min   Short Term Goals: Week 2:  PT Short Term Goal 1 (Week 2): Pt to ambulate 150' with LRAD, supervision and SaO2>90% PT Short Term Goal 2 (Week 2): Pt to negotiate up/down 5 steps with B rails and supervision PT Short Term Goal 3 (Week 2): Pt to tolerate standing activity for 15min with LRAD, supervision and SaO2 >50%  Skilled Therapeutic Interventions/Progress Updates:  1:1. Pt received sitting in recliner, ready for therapy. Focus this session on functional endurance via ambulation and therex. Pt amb 200'x2 with RW and supervision, cues for posture and pace for improved endurance. Trial use of SPC (AD that pt utilizes at home) 50'x2, req min guard A but pt demonstrating antalgic gait due to B hip pain. Pt communicating preferences throughout session via gestures and communication board. Pt utilized NuStep for B UE/LE strength and endurance, level 5x6010min with good tolerance. Pt maintaining pace in 60s-70s. Pt req seated rest throughout session due to fatigue and secretion management. Pt left sitting in recliner at end of session with all needs in reach.   Therapy Documentation Precautions:  Precautions Precautions: Fall Precaution Comments: h/o falls PTA per chart, trach and O2 Restrictions Weight Bearing Restrictions: No General:   Vital Signs: Therapy Vitals Pulse Rate: 85 Resp: 16 Patient Position (if appropriate): Sitting Oxygen Therapy SpO2: 96 % O2 Device: Trach collar O2 Flow Rate (L/min): 5 L/min FiO2 (%): 28 % Pulse Oximetry Type: Intermittent Pain: Pain Assessment Pain Assessment: No/denies pain  See FIM for current functional status  Therapy/Group: Individual Therapy  [name], Valerian Jewel S 08/10/2014, 12:11 PM

## 2014-08-11 ENCOUNTER — Inpatient Hospital Stay (HOSPITAL_COMMUNITY): Payer: Medicaid Other | Admitting: Physical Therapy

## 2014-08-11 ENCOUNTER — Inpatient Hospital Stay (HOSPITAL_COMMUNITY): Payer: Medicaid Other | Admitting: Speech Pathology

## 2014-08-11 ENCOUNTER — Encounter (HOSPITAL_COMMUNITY): Payer: Medicaid Other

## 2014-08-11 LAB — GLUCOSE, CAPILLARY
GLUCOSE-CAPILLARY: 209 mg/dL — AB (ref 70–99)
Glucose-Capillary: 218 mg/dL — ABNORMAL HIGH (ref 70–99)
Glucose-Capillary: 168 mg/dL — ABNORMAL HIGH (ref 70–99)
Glucose-Capillary: 197 mg/dL — ABNORMAL HIGH (ref 70–99)
Glucose-Capillary: 229 mg/dL — ABNORMAL HIGH (ref 70–99)
Glucose-Capillary: 238 mg/dL — ABNORMAL HIGH (ref 70–99)

## 2014-08-11 NOTE — Progress Notes (Signed)
[facility] PHYSICAL MEDICINE & REHABILITATION     PROGRESS NOTE     Subjective/Complaints: Able to sleep in recliner which is what he does at home  A  review of systems has been performed and if not noted above is otherwise negative.   Objective: Vital Signs: Blood pressure 133/65, pulse 74, temperature 99.2 F (37.3 C), temperature source Oral, resp. rate 18, height 5\' 2"  (1.575 m), weight 94.892 kg (209 lb 3.2 oz), SpO2 98.00%. No results found. No results found for this basename: WBC, HGB, HCT, PLT,  in the last 72 hours No results found for this basename: NA, K, CL, CO, GLUCOSE, BUN, CREATININE, CALCIUM,  in the last 72 hours CBG (last 3)   Recent Labs  08/10/14 2005 08/11/14 0013 08/11/14 0401  GLUCAP 236* 209* 218*    Wt Readings from Last 3 Encounters:  08/10/14 94.892 kg (209 lb 3.2 oz)  07/26/14 93 kg (205 lb 0.4 oz)  07/26/14 93 kg (205 lb 0.4 oz)    Physical Exam:  Constitutional:  No acute distress.  HENT: orlal mucosa pink/moist  Head: Normocephalic.  Eyes: EOM are normal.  Neck:  #6 cuffed trach in place--minimal secretions thru trach  Cardiac rate controlled. No murmurs. Reg rhythm  Respiratory:  No distress. No audible breath sounds.   rhonchi on exam.  GI: Soft. Bowel sounds are normal. He exhibits no distension. G-tube site clean Neurological: .  Patient is able to provide his name, age and date of birth. Follows simple commands. CN exam grossly intact. UE: 4-delt, 4/5 bicep/tricep/HI. LE's: 3/5 HF, 3+KE, 4- ankles. No gross sensory changes. Good sitting balance.  Psychiatric:  Flat. Appears alert Skin: a few scattered echhymoses on limbs still   Assessment/Plan: 1. Functional deficits secondary to severe deconditioning after respiratory failure which require 3+ hours per day of interdisciplinary therapy in a comprehensive inpatient rehab setting. Physiatrist is providing close team supervision and 24 hour management of active medical problems  listed below. Physiatrist and rehab team continue to assess barriers to discharge/monitor patient progress toward functional and medical goals.    FIM: FIM - Bathing Bathing Steps Patient Completed: Chest;Right Arm;Left Arm;Abdomen;Front perineal area;Buttocks;Right upper leg;Left upper leg;Right lower leg (including foot);Left lower leg (including foot) Bathing: 5: Supervision: Safety issues/verbal cues  FIM - Upper Body Dressing/Undressing Upper body dressing/undressing: 0: Wears gown/pajamas-no public clothing FIM - Lower Body Dressing/Undressing Lower body dressing/undressing steps patient completed: Don/Doff right sock;Don/Doff left sock Lower body dressing/undressing: 0: Wears gown/pajamas-no public clothing  FIM - Toileting Toileting steps completed by patient: Performs perineal hygiene Toileting Assistive Devices: Grab bar or rail for support Toileting: 5: Supervision: Safety issues/verbal cues  FIM - Diplomatic Services operational officerToilet Transfers Toilet Transfers Assistive Devices: PsychiatristBedside commode Toilet Transfers: 5-To toilet/BSC: Supervision (verbal cues/safety issues);5-From toilet/BSC: Supervision (verbal cues/safety issues)  FIM - Press photographerBed/Chair Transfer Bed/Chair Transfer Assistive Devices: Arm rests;Walker Bed/Chair Transfer: 5: Bed > Chair or W/C: Supervision (verbal cues/safety issues);5: Chair or W/C > Bed: Supervision (verbal cues/safety issues)  FIM - Locomotion: Wheelchair Distance: 100 Locomotion: Wheelchair: 0: Activity did not occur FIM - Locomotion: Ambulation Locomotion: Ambulation Assistive Devices: Walker - Rolling;Cane - Straight Ambulation/Gait Assistance: 4: Min assist;5: Supervision ((S) for RW, min A for SPC) Locomotion: Ambulation: 2: Travels 50 - 149 ft with minimal assistance (Pt.>75%)  Comprehension Comprehension Mode: Auditory Comprehension: 6-Follows complex conversation/direction: With extra time/assistive device  Expression Expression Mode: Nonverbal Expression  Assistive Devices: 6-Communication board Expression: 5-Expresses basic needs/ideas: With extra time/assistive device  Social Interaction  Social Interaction: 6-Interacts appropriately with others with medication or extra time (anti-anxiety, antidepressant).  Problem Solving Problem Solving: 5-Solves basic problems: With no assist  Memory Memory: 5-Recognizes or recalls 90% of the time/requires cueing < 10% of the time  Medical Problem List and Plan:  1. Functional deficits secondary to debilitation after cardiac arrest/respiratory failure.  2. 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   -continue #6 cuffed trach for now with cuff up per CCS  -IS/FV  -pulmonary toilet  -continue augmentin  -has ongoing low grade temp    2. DVT Prophylaxis/Anticoagulation: Subcutaneous heparin. Monitor platelet counts and any signs of bleeding  3. Pain Management: Tylenol as needed  4.  Severe Oral-pharyngeal --   -G-tube placed and he's tolerating TF 5. Neuropsych: This patient is capable of making decisions on his own behalf.   -  lexapro qhs for reactive derpession 6. Skin/Wound Care: Routine skin care. Trach care as directed  7. Fluids/Electrolytes/Nutrition:  Following weights and I's and o's  8. CAD/CABG. Patient has refused cardiac catheterization in the past  9. Diastolic congestive heart failure. Continue Lasix as directed.     -weights trending back up. Doesn't appear fluid overloaded---observe for now 10. Diabetes mellitus with peripheral neuropathy.   -increased lantus to 60u qam and 60u qpm with some improvement---continue to follow   11. Tobacco abuse. Provide counseling   12. Chronic renal insufficiency. Baseline creatinine 1.5-2.   13. Extensive sinus disease left mastoid. Continue Augmentin for time being

## 2014-08-11 NOTE — Progress Notes (Signed)
Inpatient Diabetes Program Recommendations  AACE/ADA: New Consensus Statement on Inpatient Glycemic Control (2013)  Target Ranges:  Prepandial:   less than 140 mg/dL      Peak postprandial:   less than 180 mg/dL (1-2 hours)      Critically ill patients:  140 - 180 mg/dL   Results for Richard [name], Richard [name] (MRN [id number]) as of 08/11/2014 08:11  Ref. Range 08/10/2014 00:15 08/10/2014 04:34 08/10/2014 08:00 08/10/2014 12:15 08/10/2014 16:11 08/10/2014 20:05 08/11/2014 00:13 08/11/2014 04:01  Glucose-Capillary Latest Range: 70-99 mg/dL 409217 (H) 811137 (H) 914173 (H) 139 (H) 190 (H) 236 (H) 209 (H) 218 (H)   Diabetes history: DM2  Outpatient Diabetes medications: Lantus 40 units BID, Novolog 0-20 units Q4H  Current orders for Inpatient glycemic control: Lantus 60 units BID, Novolog 0-20 units Q4H  Inpatient Diabetes Program Recommendations Insulin - Meal Coverage: Please consider ordering Novolog 3 units Q4H for tube feeding coverage to improve glycemic control.  Note: CBGs ranged from 137-236 mg/dl on 78/29/5610/14/15 and has ranged from 209-218 today so far. Patient received a total of Novolog 21 units on 08/10/14 for correction and has received a total of Novolog 14 units so far today for correction. Please consider ordering Novolog coverage for tube feeding.  Thanks, Orlando PennerMarie Auset Fritzler, RN, MSN, CCRN Diabetes Coordinator Inpatient Diabetes Program [phone] (Team Pager) [phone] ([facility] office) [phone] [facility] office)

## 2014-08-11 NOTE — Progress Notes (Signed)
Occupational Therapy Session Note  Patient Details  Name: Cyril LoosenJimmie G Terriquez MRN: [id number] Date of Birth: [birth year]  Today's Date: 08/11/2014 OT Individual Time: 0700-0800 OT Individual Time Calculation (min): 60 min    Short Term Goals: Week 2:  OT Short Term Goal 1 (Week 2): LB dressing: supervision with sit<>stand OT Short Term Goal 2 (Week 2): Simple HM tasks: Min assist with LRAD to gather clothes prior to B/D and assist with clean up OT Short Term Goal 3 (Week 2): Pt will perform bathing at supervision level with sit<>stand  Skilled Therapeutic Interventions/Progress Updates:    Pt engaged in bathing and dressing with sit<>stand from recliner.  Pt used BSC X 2 with assistance to manage O2 tubing.  Pt utilized AE appropriately for LB bathing.  PT continues to decline wearing clothing.  Focus on activity tolerance, dynamic standing balance, sit<>stand, transfers, and safety awareness.   Therapy Documentation Precautions:  Precautions Precautions: Fall Precaution Comments: h/o falls PTA per chart, trach and O2 Restrictions Weight Bearing Restrictions: No Pain: Pain Assessment Pain Assessment: No/denies pain  See FIM for current functional status  Therapy/Group: Individual Therapy  [name], Solita Macadam Chappell 08/11/2014, 10:04 AM

## 2014-08-11 NOTE — Progress Notes (Addendum)
Physical Therapy Session Note  Patient Details  Name: Cyril LoosenJimmie G Baik MRN: [id number] Date of Birth: 12/30/1960  Today's Date: 08/11/2014 PT Individual Time: [id number] PT Individual Time Calculation (min): 60 min   Short Term Goals: Week 2:  PT Short Term Goal 1 (Week 2): Pt to ambulate 150' with LRAD, supervision and SaO2>90% PT Short Term Goal 2 (Week 2): Pt to negotiate up/down 5 steps with B rails and supervision PT Short Term Goal 3 (Week 2): Pt to tolerate standing activity for 15min with LRAD, supervision and SaO2 >50%  Skilled Therapeutic Interventions/Progress Updates:    Pt received seated in recliner on 5 L/min supplemental O2, 28% FiO2 via trach collar. Pt agreed to therapy with mod coaxing. Session planned to focus on dynamic standing balance; however, pt refused all standing and ambulation. Attempted to educate and encourage pt to ambulate to/from gym; however, pt shook head "no" and wrote "wheelchair" on white board. Therefore, session focused on functional LE strengthening and endurance. Performed stand pivot transfers from recliner>bedside commode>w/c with supervision, no assistive device. Pt continent of bladder. Performed w/c mobility 2x150' in controlled environment with supervision. Initial 150' trial with bilat UE's; subsequent trial x150' retro propulsion for quadriceps strengthening. Returned to pt room, where pt performed stand pivot transfer from w/c>recliner with supervision. Pt continuing to decline all standing activities. Therefore, pt performed the following LE strengthening exercises while seated in chair: manually-resisted bilat hip abduction/adduction 2x15 reps, LAQ with concurrent pillow squeeze 3x10 reps per side. Departed with pt seated in recliner with supplemental O2 as described upon arrival; RN present and all needs within reach.  Therapy Documentation Precautions:  Precautions Precautions: Fall Precaution Comments: h/o falls PTA per chart, trach and  O2 Restrictions Weight Bearing Restrictions: No Vital Signs: Therapy Vitals Pulse Rate: 78 Resp: 22 Patient Position (if appropriate): Sitting (bedside commode) Oxygen Therapy SpO2: 97 % O2 Device: Trach collar O2 Flow Rate (L/min): 6 L/min FiO2 (%): 28 % Pain: Pain Assessment Pt denies pain during this session. Locomotion : Wheelchair Mobility Distance: 150   See FIM for current functional status  Therapy/Group: Individual Therapy  Rayola Everhart, Lorenda IshiharaBlair A 08/11/2014, 4:22 PM

## 2014-08-12 ENCOUNTER — Encounter (HOSPITAL_COMMUNITY): Payer: Medicaid Other

## 2014-08-12 ENCOUNTER — Inpatient Hospital Stay (HOSPITAL_COMMUNITY): Payer: Medicaid Other | Admitting: Speech Pathology

## 2014-08-12 ENCOUNTER — Inpatient Hospital Stay (HOSPITAL_COMMUNITY): Payer: Medicaid Other

## 2014-08-12 LAB — GLUCOSE, CAPILLARY
GLUCOSE-CAPILLARY: 197 mg/dL — AB (ref 70–99)
GLUCOSE-CAPILLARY: 150 mg/dL — AB (ref 70–99)
GLUCOSE-CAPILLARY: 169 mg/dL — AB (ref 70–99)
Glucose-Capillary: 189 mg/dL — ABNORMAL HIGH (ref 70–99)
Glucose-Capillary: 196 mg/dL — ABNORMAL HIGH (ref 70–99)
Glucose-Capillary: 226 mg/dL — ABNORMAL HIGH (ref 70–99)

## 2014-08-12 NOTE — Progress Notes (Addendum)
Physical Therapy Weekly Progress Note  Patient Details  Name: Richard [name] MRN: [id number] Date of Birth: 1961-01-23  Beginning of progress report period: August 05, 2014 End of progress report period: August 12, 2014  Today's Date: 08/12/2014 PT Individual Time: 1430-1500 PT Individual Time Calculation (min): 30 min   Pt continues to make slow, but steady progress regarding overall functional endurance in physical therapy. Pt has achieved 3/3 STGs, see details below. Pt currently overall supervision at standing level with RW for transfers, ambulation up to 175', stair negotiation with single rail and balance. Therapist consistently managing multiple lines during transfers and mobility. Pt continues to req seated rest during sessions due to fatigue as well as for management of secretions. SaO2 consistently >95% via trach collar with recommended settings by RT, see vitals sheet. LTGs modified due to anticipated d/c now to SNF. Will continue per current POC.   Patient continues to demonstrate the following deficits: decreased functional endurance, O2 dependence, decreased balance, decreased strength, decreased overall functional transfers and mobility and therefore will continue to benefit from skilled PT intervention to enhance overall performance with activity tolerance, balance and strength, functional transfers and mobility.  Patient progressing toward long term goals.  Continue plan of care.  PT Short Term Goals Week 1:  PT Short Term Goal 1 (Week 1): Pt will perform all aspects of bed mobility at min A level with HOB flat and without rails.  PT Short Term Goal 1 - Progress (Week 1): Discontinued (comment) (Pt reports that he sleeps in a recliner) PT Short Term Goal 2 (Week 1): Pt will perform stand pivot transfers at S level PT Short Term Goal 2 - Progress (Week 1): Met PT Short Term Goal 3 (Week 1): Pt will ambulate x 50' w/ LRAD at S level with SaO2 >90%.   PT Short Term Goal 3 -  Progress (Week 1): Progressing toward goal Week 2:  PT Short Term Goal 1 (Week 2): Pt to ambulate 150' with LRAD, supervision and SaO2>90% PT Short Term Goal 1 - Progress (Week 2): Met PT Short Term Goal 2 (Week 2): Pt to negotiate up/down 5 steps with B rails and supervision PT Short Term Goal 2 - Progress (Week 2): Met PT Short Term Goal 3 (Week 2): Pt to tolerate standing activity for 56min with LRAD, supervision and SaO2 >90% PT Short Term Goal 3 - Progress (Week 2): Met Week 3:  PT Short Term Goal 1 (Week 3): STGs=LTGs due to anticipated LOS  Skilled Therapeutic Interventions/Progress Updates:  1:1. Pt received sitting on BSC, req supervision for t/f back to recliner. Pt shaking head "no" to participation in therapy, req encouragement to use communication board for explanation. Pt writing, "Everything hurts. My stomach is still sore." RN made aware. Pt agreeable to participate after max encouragement and education regarding benefit of therapies. Focus on functional endurance during mobility and therex. Pt req supervision for ambulation 150'x1 and 225'x1 with RW as well as negotiation up/down 5 steps with single rail. Pt cued for posture and proximity to RW. Pt given various options to work on functional endurance, communicated preference of performing NuStep via gestures. Pt utilized NuStep to further target strength and endurance, level 5x17min with overall good tolerance. Pt able to maintain SaO2 >95% on 3L at 28%. Pt req supervision for toileting with use of BSC at end of session. Pt left sitting in recliner at end of session w/ all needs in reach.   Therapy Documentation Precautions:  Precautions  Precautions: Fall Precaution Comments: h/o falls PTA per chart, trach and O2 Restrictions Weight Bearing Restrictions: No   Vital Signs: Therapy Vitals Temp: 98.5 F (36.9 C) Temp Source: Oral Pulse Rate: 72 Resp: 18 BP: 121/65 mmHg Patient Position (if appropriate): Sitting Oxygen  Therapy SpO2: 99 % O2 Device: Trach collar O2 Flow Rate (L/min): 5 L/min FiO2 (%): 28 %  See FIM for current functional status  Therapy/Group: Individual Therapy  Gilmore Laroche 08/12/2014, 3:57 PM

## 2014-08-12 NOTE — Progress Notes (Signed)
[place] PHYSICAL MEDICINE & REHABILITATION     PROGRESS NOTE     Subjective/Complaints: Last BM 10/15 Poor sleep (nods with Y/N questions), doesn't want sleep meds  A  review of systems has been performed and if not noted above is otherwise negative.   Objective: Vital Signs: Blood pressure 136/63, pulse 71, temperature 98.7 F (37.1 C), temperature source Oral, resp. rate 18, height 5\' 2"  (1.575 m), weight 94.4 kg (208 lb 1.8 oz), SpO2 100.00%. No results found. No results found for this basename: WBC, HGB, HCT, PLT,  in the last 72 hours No results found for this basename: NA, K, CL, CO, GLUCOSE, BUN, CREATININE, CALCIUM,  in the last 72 hours CBG (last 3)   Recent Labs  08/11/14 2025 08/12/14 0009 08/12/14 0417  GLUCAP 238* 189* 226*    Wt Readings from Last 3 Encounters:  08/12/14 94.4 kg (208 lb 1.8 oz)  07/26/14 93 kg (205 lb 0.4 oz)  07/26/14 93 kg (205 lb 0.4 oz)    Physical Exam:  Constitutional:  No acute distress.  HENT: orlal mucosa pink/moist  Head: Normocephalic.  Eyes: EOM are normal.  Neck:  #6 cuffed trach in place--minimal secretions thru trach  Cardiac rate controlled. No murmurs. Reg rhythm  Respiratory:  No distress. decreased breath sounds.   Upper airway sounds on exam.  GI: Soft. Bowel sounds are normal. He exhibits mild distension. G-tube site clean Neurological: .  Patient is able to provide his name, age and date of birth. Follows simple commands. CN exam grossly intact. UE: 4-delt, 4/5 bicep/tricep/HI. LE's: 3/5 HF, 3+KE, 4- ankles. No gross sensory changes. Good sitting balance.  Psychiatric:  Flat. Appears alert Skin: trach site ok   Assessment/Plan: 1. Functional deficits secondary to severe deconditioning after respiratory failure which require 3+ hours per day of interdisciplinary therapy in a comprehensive inpatient rehab setting. Physiatrist is providing close team supervision and 24 hour management of active medical  problems listed below. Physiatrist and rehab team continue to assess barriers to discharge/monitor patient progress toward functional and medical goals.    FIM: FIM - Bathing Bathing Steps Patient Completed: Chest;Right Arm;Left Arm;Abdomen;Front perineal area;Buttocks;Right upper leg;Left upper leg;Right lower leg (including foot);Left lower leg (including foot) Bathing: 5: Supervision: Safety issues/verbal cues  FIM - Upper Body Dressing/Undressing Upper body dressing/undressing: 0: Wears gown/pajamas-no public clothing FIM - Lower Body Dressing/Undressing Lower body dressing/undressing steps patient completed: Don/Doff right sock;Don/Doff left sock Lower body dressing/undressing: 0: Wears gown/pajamas-no public clothing  FIM - Toileting Toileting steps completed by patient: Performs perineal hygiene Toileting Assistive Devices: Grab bar or rail for support Toileting: 5: Supervision: Safety issues/verbal cues  FIM - Diplomatic Services operational officerToilet Transfers Toilet Transfers Assistive Devices: PsychiatristBedside commode Toilet Transfers: 5-To toilet/BSC: Supervision (verbal cues/safety issues);5-From toilet/BSC: Supervision (verbal cues/safety issues)  FIM - Press photographerBed/Chair Transfer Bed/Chair Transfer Assistive Devices: Arm rests;Walker Bed/Chair Transfer: 5: Bed > Chair or W/C: Supervision (verbal cues/safety issues);5: Chair or W/C > Bed: Supervision (verbal cues/safety issues)  FIM - Locomotion: Wheelchair Distance: 150 Locomotion: Wheelchair: 5: Travels 150 ft or more: maneuvers on rugs and over door sills with supervision, cueing or coaxing FIM - Locomotion: Ambulation Locomotion: Ambulation Assistive Devices: Walker - Rolling;Cane - Straight Ambulation/Gait Assistance: 4: Min assist;5: Supervision ((S) for RW, min A for SPC) Locomotion: Ambulation: 0: Activity did not occur  Comprehension Comprehension Mode: Auditory Comprehension: 5-Understands complex 90% of the time/Cues < 10% of the  time  Expression Expression Mode: Nonverbal Expression Assistive Devices: 6-Communication board Expression:  2-Expresses basic 25 - 49% of the time/requires cueing 50 - 75% of the time. Uses single words/gestures.  Social Interaction Social Interaction: 5-Interacts appropriately 90% of the time - Needs monitoring or encouragement for participation or interaction.  Problem Solving Problem Solving: 5-Solves basic problems: With no assist  Memory Memory: 5-Requires cues to use assistive device  Medical Problem List and Plan:  1. Functional deficits secondary to debilitation after cardiac arrest/respiratory failure.  2. 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   -continue #6 cuffed trach for now with cuff up per CCS  -IS/FV  -pulmonary toilet  -continue augmentin  -has ongoing low grade temp    2. DVT Prophylaxis/Anticoagulation: Subcutaneous heparin. Monitor platelet counts and any signs of bleeding  3. Pain Management: Tylenol as needed  4.  Severe Oral-pharyngeal --   -G-tube placed and he's tolerating TF 5. Neuropsych: This patient is capable of making decisions on his own behalf.   -  lexapro qhs for reactive derpession 6. Skin/Wound Care: Routine skin care. Trach care as directed  7. Fluids/Electrolytes/Nutrition:  Following weights and I's and o's  8. CAD/CABG. Patient has refused cardiac catheterization in the past  9. Diastolic congestive heart failure. Continue Lasix as directed.     -weights trending back up. Doesn't appear fluid overloaded---observe for now 10. Diabetes mellitus with peripheral neuropathy.   -increased lantus to 60u qam and 60u qpm with some improvement---continue to follow   11. Tobacco abuse. Provide counseling   12. Chronic renal insufficiency. Baseline creatinine 1.5-2.   13. Extensive sinus disease left mastoid. Continue Augmentin for time being

## 2014-08-12 NOTE — Progress Notes (Signed)
Occupational Therapy Session Note  Patient Details  Name: Richard [name] MRN: [id number] Date of Birth: 17-Dec-1960  Today's Date: 08/12/2014 OT Individual Time: 0700-0800 OT Individual Time Calculation (min): 60 min    Short Term Goals: Week 2:  OT Short Term Goal 1 (Week 2): LB dressing: supervision with sit<>stand OT Short Term Goal 2 (Week 2): Simple HM tasks: Min assist with LRAD to gather clothes prior to B/D and assist with clean up OT Short Term Goal 3 (Week 2): Pt will perform bathing at supervision level with sit<>stand  Skilled Therapeutic Interventions/Progress Updates:    Pt engaged in bathing and dressing with sit<>stand from recliner. Pt used BSC X 2 with assistance to manage O2 tubing. Pt utilized AE appropriately for LB bathing. PT continues to decline wearing clothing. Focus on activity tolerance, dynamic standing balance, sit<>stand, transfers, and safety awareness   Therapy Documentation Precautions:  Precautions Precautions: Fall Precaution Comments: h/o falls PTA per chart, trach and O2 Restrictions Weight Bearing Restrictions: No Pain: Pain Assessment Pain Assessment: No/denies pain  See FIM for current functional status  Therapy/Group: Individual Therapy  [name], [name] 08/12/2014, 8:01 AM

## 2014-08-12 NOTE — Progress Notes (Signed)
Speech Language Pathology Daily Session Note  Patient Details  Name: Richard [name] MRN: [id number] Date of Birth: Mar 18, 1961  Today's Date: 08/12/2014 SLP Individual Time: 1030-1100 SLP Individual Time Calculation (min): 30 min  Short Term Goals: Week 3: SLP Short Term Goal 1 (Week 3): Pt will complete pharyngeal strengthening exercise to maximize swallowing function with Min cues SLP Short Term Goal 2 (Week 3): Pt will recall daily information with use of external memory aids with Min multimodal cues SLP Short Term Goal 3 (Week 3): Pt will utilize multimodal communication to express wants/needs with Mod multimodal cues  Skilled Therapeutic Interventions: Skilled treatment session focused on addressing self-care goals. Patient independently utilized gestures to express toileting needs but required Max assist cues to initiate use of white board to indicate more abstract and/or complex needs and wants. SLP also facilitated session Min cues to utilize external aids to recall previously taught pharyngeal strengthening exercises "his way." Continue with current plan of care.   FIM:  Comprehension Comprehension Mode: Auditory Comprehension: 5-Understands basic 90% of the time/requires cueing < 10% of the time Expression Expression Mode: Nonverbal Expression Assistive Devices: 6-Communication board Expression: 2-Expresses basic 25 - 49% of the time/requires cueing 50 - 75% of the time. Uses single words/gestures. Social Interaction Social Interaction: 4-Interacts appropriately 75 - 89% of the time - Needs redirection for appropriate language or to initiate interaction. Problem Solving Problem Solving: 5-Solves basic 90% of the time/requires cueing < 10% of the time Memory Memory: 4-Recognizes or recalls 75 - 89% of the time/requires cueing 10 - 24% of the time  Pain Pain Assessment Pain Assessment: No/denies pain  Therapy/Group: Individual Therapy  Charlane FerrettiMelissa Francesco Provencal, M.A.,  CCC-SLP [phone]  Richard [name] 08/12/2014, 3:49 PM

## 2014-08-12 NOTE — Plan of Care (Signed)
Problem: RH Bed Mobility Goal: LTG Patient will perform bed mobility with assist (PT) LTG: Patient will perform bed mobility with assistance, with/without cues (PT).  Outcome: Not Applicable Date Met:  59/53/96 Goal D/C, pt sleeps in a recliner at baseline.   Problem: RH Car Transfers Goal: LTG Patient will perform car transfers with assist (PT) LTG: Patient will perform car transfers with assistance (PT).  Outcome: Not Applicable Date Met:  72/89/79 Anticipated d/c to SNF.   Problem: RH Wheelchair Mobility Goal: LTG Patient will propel w/c in controlled environment (PT) LTG: Patient will propel wheelchair in controlled environment, # of feet with assist (PT)  Outcome: Not Applicable Date Met:  15/04/13 Pt at consistent ambulatory level.

## 2014-08-13 ENCOUNTER — Inpatient Hospital Stay (HOSPITAL_COMMUNITY): Payer: Medicaid Other

## 2014-08-13 LAB — GLUCOSE, CAPILLARY
GLUCOSE-CAPILLARY: 252 mg/dL — AB (ref 70–99)
Glucose-Capillary: 169 mg/dL — ABNORMAL HIGH (ref 70–99)
Glucose-Capillary: 178 mg/dL — ABNORMAL HIGH (ref 70–99)
Glucose-Capillary: 199 mg/dL — ABNORMAL HIGH (ref 70–99)
Glucose-Capillary: 232 mg/dL — ABNORMAL HIGH (ref 70–99)
Glucose-Capillary: 277 mg/dL — ABNORMAL HIGH (ref 70–99)

## 2014-08-13 NOTE — Progress Notes (Signed)
Occupational Therapy Weekly Progress Note  Patient Details  Name: Richard [name] MRN: [id number] Date of Birth: Aug 14, 1961  Beginning of progress report period: August 05, 2014 End of progress report period: August 13, 2014  Patient has met 2 of 3 short term goals.  Pt's progress has been slow and pt continues to decline wearing clothing.  Pt performs all functional transfers with supervision requiring assistance with O2 tubing.  Pt completes bathing tasks with supervision using AE for LB.  Pt requires max verbal cues to use AE and becomes frustrated when requested to perform activities other than BADLs.   Patient continues to demonstrate the following deficits: Activity tolerance, depression, safety and anticipatory awareness, and therefore will continue to benefit from skilled OT intervention to enhance overall performance with BADL.  Patient not progressing toward long term goals.  See goal revision..  Plan of care revisions: Homemaking and tub transfer goals discontinued d/t awaiting SNF placement..  OT Short Term Goals Week 2:  OT Short Term Goal 1 (Week 2): LB dressing: supervision with sit<>stand OT Short Term Goal 1 - Progress (Week 2): Discontinued (comment) (Pt continues to decline wearing clothing) OT Short Term Goal 2 (Week 2): Simple HM tasks: Min assist with LRAD to gather clothes prior to B/D and assist with clean up OT Short Term Goal 2 - Progress (Week 2): Met OT Short Term Goal 3 (Week 2): Pt will perform bathing at supervision level with sit<>stand OT Short Term Goal 3 - Progress (Week 2): Met Week 3:  OT Short Term Goal 1 (Week 3): STG=LTG (supervision) secondary to patient awaiting SNF placement      Therapy Documentation Precautions:  Precautions Precautions: Fall Precaution Comments: h/o falls PTA per chart, trach and O2 Restrictions Weight Bearing Restrictions: No  Pain: Pain Assessment Pain Assessment: No/denies pain  See FIM for current functional  status  Leroy Libman 08/13/2014, 3:27 PM

## 2014-08-13 NOTE — Progress Notes (Signed)
[place] PHYSICAL MEDICINE & REHABILITATION     PROGRESS NOTE     Subjective/Complaints:  Poor sleep (nods with Y/N questions), doesn't want sleep meds  A  review of systems has been performed and if not noted above is otherwise negative.   Objective: Vital Signs: Blood pressure 124/62, pulse 75, temperature 99 F (37.2 C), temperature source Oral, resp. rate 18, height 5\' 2"  (1.575 m), weight 94.4 kg (208 lb 1.8 oz), SpO2 97.00%. No results found. No results found for this basename: WBC, HGB, HCT, PLT,  in the last 72 hours No results found for this basename: NA, K, CL, CO, GLUCOSE, BUN, CREATININE, CALCIUM,  in the last 72 hours CBG (last 3)   Recent Labs  08/13/14 0011 08/13/14 0359 08/13/14 0720  GLUCAP 199* 178* 169*    Wt Readings from Last 3 Encounters:  08/12/14 94.4 kg (208 lb 1.8 oz)  07/26/14 93 kg (205 lb 0.4 oz)  07/26/14 93 kg (205 lb 0.4 oz)    Physical Exam:  Constitutional:  No acute distress.  HENT: orlal mucosa pink/moist  Head: Normocephalic.  Eyes: EOM are normal.  Neck:  #6 cuffed trach in place--nosecretions thru trach , skin looks ok Cardiac rate controlled. No murmurs. Reg rhythm  Respiratory:  No distress. decreased breath sounds.   Upper airway sounds on exam.  GI: Soft. Bowel sounds are normal. He exhibits mild distension. G-tube site clean Neurological: .  Patient is able to provide his name, age and date of birth. Follows simple commands. CN exam grossly intact. UE: 4-delt, 4/5 bicep/tricep/HI. LE's: 3/5 HF, 3+KE, 4- ankles. No gross sensory changes. Good sitting balance.  Psychiatric:  Flat. Appears alert, accurate with head nods Skin: trach site ok   Assessment/Plan: 1. Functional deficits secondary to severe deconditioning after respiratory failure which require 3+ hours per day of interdisciplinary therapy in a comprehensive inpatient rehab setting. Physiatrist is providing close team supervision and 24 hour management of  active medical problems listed below. Physiatrist and rehab team continue to assess barriers to discharge/monitor patient progress toward functional and medical goals.    FIM: FIM - Bathing Bathing Steps Patient Completed: Chest;Right Arm;Left Arm;Abdomen;Front perineal area;Buttocks;Right upper leg;Left upper leg;Right lower leg (including foot);Left lower leg (including foot) Bathing: 5: Supervision: Safety issues/verbal cues  FIM - Upper Body Dressing/Undressing Upper body dressing/undressing: 0: Wears gown/pajamas-no public clothing FIM - Lower Body Dressing/Undressing Lower body dressing/undressing steps patient completed: Don/Doff right sock;Don/Doff left sock Lower body dressing/undressing: 0: Wears gown/pajamas-no public clothing  FIM - Toileting Toileting steps completed by patient: Performs perineal hygiene Toileting Assistive Devices: Grab bar or rail for support Toileting: 5: Supervision: Safety issues/verbal cues  FIM - Diplomatic Services operational officerToilet Transfers Toilet Transfers Assistive Devices: PsychiatristBedside commode Toilet Transfers: 5-To toilet/BSC: Supervision (verbal cues/safety issues);5-From toilet/BSC: Supervision (verbal cues/safety issues)  FIM - Press photographerBed/Chair Transfer Bed/Chair Transfer Assistive Devices: Arm rests;Walker Bed/Chair Transfer: 5: Bed > Chair or W/C: Supervision (verbal cues/safety issues);5: Chair or W/C > Bed: Supervision (verbal cues/safety issues)  FIM - Locomotion: Wheelchair Distance: 150 Locomotion: Wheelchair: 0: Activity did not occur FIM - Locomotion: Ambulation Locomotion: Ambulation Assistive Devices: Designer, industrial/productWalker - Rolling Ambulation/Gait Assistance: 5: Supervision Locomotion: Ambulation: 5: Travels 150 ft or more with supervision/safety issues  Comprehension Comprehension Mode: Auditory Comprehension: 5-Follows basic conversation/direction: With no assist  Expression Expression Mode: Nonverbal Expression Assistive Devices: 6-Communication board Expression:  2-Expresses basic 25 - 49% of the time/requires cueing 50 - 75% of the time. Uses single words/gestures.  Social Interaction  Social Interaction: 4-Interacts appropriately 75 - 89% of the time - Needs redirection for appropriate language or to initiate interaction.  Problem Solving Problem Solving: 5-Solves basic 90% of the time/requires cueing < 10% of the time  Memory Memory: 4-Recognizes or recalls 75 - 89% of the time/requires cueing 10 - 24% of the time  Medical Problem List and Plan:  1. Functional deficits secondary to debilitation after cardiac arrest/respiratory failure.  2. 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   -continue #6 cuffed trach for now with cuff up per CCS  -IS/FV  -pulmonary toilet  -continue augmentin  -has ongoing low grade temp    2. DVT Prophylaxis/Anticoagulation: Subcutaneous heparin. Monitor platelet counts and any signs of bleeding  3. Pain Management: Tylenol as needed  4.  Severe Oral-pharyngeal --   -G-tube placed and he's tolerating TF 5. Neuropsych: This patient is capable of making decisions on his own behalf.   -  lexapro qhs for reactive derpession 6. Skin/Wound Care: Routine skin care. Trach care as directed  7. Fluids/Electrolytes/Nutrition:  Following weights and I's and o's  8. CAD/CABG. Patient has refused cardiac catheterization in the past  9. Diastolic congestive heart failure. Continue Lasix as directed.     -weights trending back up. Doesn't appear fluid overloaded---observe for now 10. Diabetes mellitus with peripheral neuropathy.   -increased lantus to 60u qam and 60u qpm with some improvement---continue to follow   11. Tobacco abuse. Provide counseling   12. Chronic renal insufficiency. Baseline creatinine 1.5-2.   13. Extensive sinus disease left mastoid. Continue Augmentin for time being

## 2014-08-13 NOTE — Progress Notes (Signed)
Occupational Therapy Session Note  Patient Details  Name: Cyril LoosenJimmie G Ripple MRN: [id number] Date of Birth: [birth year]  Today's Date: 08/13/2014 OT Individual Time: 1345-1430 OT Individual Time Calculation (min): 45 min    Short Term Goals: Week 2:  OT Short Term Goal 1 (Week 2): LB dressing: supervision with sit<>stand OT Short Term Goal 2 (Week 2): Simple HM tasks: Min assist with LRAD to gather clothes prior to B/D and assist with clean up OT Short Term Goal 3 (Week 2): Pt will perform bathing at supervision level with sit<>stand  Skilled Therapeutic Interventions/Progress Updates:    Pt engaged in bathing and dressing with sit<>stand from recliner. Pt used BSC X 2 with assistance to manage O2 tubing. Pt utilized AE appropriately for LB bathing. PT continues to decline wearing clothing. Focus on activity tolerance, dynamic standing balance, sit<>stand, transfers, and safety awareness   Therapy Documentation Precautions:  Precautions Precautions: Fall Precaution Comments: h/o falls PTA per chart, trach and O2 Restrictions Weight Bearing Restrictions: No General:   Vital Signs: Therapy Vitals Pulse Rate: 74 Resp: 18 Patient Position (if appropriate): Sitting Oxygen Therapy SpO2: 99 % O2 Device: Trach collar O2 Flow Rate (L/min): 5 L/min FiO2 (%): 28 % Pain: Pain Assessment Pain Assessment: No/denies pain  See FIM for current functional status  Therapy/Group: Individual Therapy  [name], Kerstyn Coryell Chappell 08/13/2014, 2:32 PM

## 2014-08-14 ENCOUNTER — Inpatient Hospital Stay (HOSPITAL_COMMUNITY): Payer: Medicaid Other | Admitting: Physical Therapy

## 2014-08-14 LAB — GLUCOSE, CAPILLARY
GLUCOSE-CAPILLARY: 205 mg/dL — AB (ref 70–99)
GLUCOSE-CAPILLARY: 267 mg/dL — AB (ref 70–99)
Glucose-Capillary: 206 mg/dL — ABNORMAL HIGH (ref 70–99)
Glucose-Capillary: 176 mg/dL — ABNORMAL HIGH (ref 70–99)
Glucose-Capillary: 138 mg/dL — ABNORMAL HIGH (ref 70–99)
Glucose-Capillary: 215 mg/dL — ABNORMAL HIGH (ref 70–99)

## 2014-08-14 NOTE — Progress Notes (Signed)
RT note: Patient patient unavailable, with PT unable to do 8:00 trach check and vitals.

## 2014-08-14 NOTE — Progress Notes (Addendum)
Physical Therapy Session Note  Patient Details  Name: Richard [name] MRN: [id number] Date of Birth: May 21, 1961  Today's Date: 08/14/2014 PT Individual Time: 0900-1015 PT Individual Time Calculation (min): 75 min   Short Term Goals: Week 1:  PT Short Term Goal 1 (Week 1): Pt will perform all aspects of bed mobility at min A level with HOB flat and without rails.  PT Short Term Goal 1 - Progress (Week 1): Discontinued (comment) (Pt reports that he sleeps in a recliner) PT Short Term Goal 2 (Week 1): Pt will perform stand pivot transfers at S level PT Short Term Goal 2 - Progress (Week 1): Met PT Short Term Goal 3 (Week 1): Pt will ambulate x 50' w/ LRAD at S level with SaO2 >90%.   PT Short Term Goal 3 - Progress (Week 1): Progressing toward goal Week 2:  PT Short Term Goal 1 (Week 2): Pt to ambulate 150' with LRAD, supervision and SaO2>90% PT Short Term Goal 1 - Progress (Week 2): Met PT Short Term Goal 2 (Week 2): Pt to negotiate up/down 5 steps with B rails and supervision PT Short Term Goal 2 - Progress (Week 2): Met PT Short Term Goal 3 (Week 2): Pt to tolerate standing activity for 41min with LRAD, supervision and SaO2 >90% PT Short Term Goal 3 - Progress (Week 2): Met Week 3:  PT Short Term Goal 1 (Week 3): STGs=LTGs due to anticipated LOS  Skilled Therapeutic Interventions/Progress Updates:    Pt with periods of impulsiveness and mental rigidity in session limiting treatment. Pt focused on performing nu-step deferring gait in session. Pt defers use of communication board then impulsively stands up without assist to perform functional task. Pt would continue to benefit from skilled PT services to increase functional mobility.  Therapy Documentation Precautions:  Precautions Precautions: Fall Precaution Comments: h/o falls PTA per chart, trach and O2 Restrictions Weight Bearing Restrictions: No Vital Signs: Therapy Vitals Pulse Rate: 74 (Pulse 71 at rest) Oxygen  Therapy SpO2: 94 % (s/p exercise, 99% RA) O2 Device:  (trach collar) FiO2 (%): 28 % Pain: Pain Assessment Pain Assessment: No/denies pain Mobility:  Pt performs transfers with supervision with cues for safety and sequencing Locomotion : Wheelchair Mobility Distance: 150'x2  Other Treatments:  Pt educated on safety in mobility, progressing mobility, and rehab plan. Transfers x10 in session. Pt performs static and dynamic sitting and standing balance tasks with dual motor tasks including weight shifting, reaching, grasping within functional context. Nu-step performed 8'x2.  See FIM for current functional status  Therapy/Group: Individual Therapy  Monia Pouch 08/14/2014, 9:53 AM

## 2014-08-14 NOTE — Progress Notes (Signed)
Patient ID: Richard LoosenJimmie G [name], male   DOB: [birth year], 53 y.o.   MRN: [id number]  Richard [name] PHYSICAL MEDICINE & REHABILITATION     PROGRESS NOTE     Subjective/Complaints: Pt denies any breathing issues,  No SOB or CP Accurate with Y/N nods    A  review of systems has been performed and if not noted above is otherwise negative.   Objective: Vital Signs: Blood pressure 136/45, pulse 71, temperature 99.6 F (37.6 C), temperature source Oral, resp. rate 18, height 5\' 2"  (1.575 m), weight 91.853 kg (202 lb 8 oz), SpO2 99.00%. No results found. No results found for this basename: WBC, HGB, HCT, PLT,  in the last 72 hours No results found for this basename: NA, K, CL, CO, GLUCOSE, BUN, CREATININE, CALCIUM,  in the last 72 hours CBG (last 3)   Recent Labs  08/13/14 2016 08/14/14 0028 08/14/14 0424  GLUCAP 277* 267* 206*    Wt Readings from Last 3 Encounters:  08/14/14 91.853 kg (202 lb 8 oz)  07/26/14 93 kg (205 lb 0.4 oz)  07/26/14 93 kg (205 lb 0.4 oz)    Physical Exam:  Constitutional:  No acute distress.  HENT: oral mucosa pink/moist  Head: Normocephalic.  Eyes: EOM are normal.  Neck:  #6 cuffed trach in place--nosecretions thru trach , skin looks ok Cardiac rate controlled. No murmurs. Reg rhythm  Respiratory:  No distress. decreased breath sounds.   Upper airway sounds on exam.  GI: Soft. Bowel sounds are normal. He exhibits mild distension. G-tube site clean Neurological: .  Patient is able to provide his name, age and date of birth. Follows simple commands. CN exam grossly intact. UE: 4-delt, 4/5 bicep/tricep/HI. LE's: 3/5 HF, 3+KE, 4- ankles. No gross sensory changes. Good sitting balance.  Psychiatric:  Flat. Appears alert, accurate with head nods Skin: trach site ok   Assessment/Plan: 1. Functional deficits secondary to severe deconditioning after respiratory failure which require 3+ hours per day of interdisciplinary therapy in a comprehensive inpatient  rehab setting. Physiatrist is providing close team supervision and 24 hour management of active medical problems listed below. Physiatrist and rehab team continue to assess barriers to discharge/monitor patient progress toward functional and medical goals.    FIM: FIM - Bathing Bathing Steps Patient Completed: Chest;Right Arm;Left Arm;Abdomen;Front perineal area;Buttocks;Right upper leg;Left upper leg;Right lower leg (including foot);Left lower leg (including foot) Bathing: 5: Supervision: Safety issues/verbal cues  FIM - Upper Body Dressing/Undressing Upper body dressing/undressing: 0: Wears gown/pajamas-no public clothing FIM - Lower Body Dressing/Undressing Lower body dressing/undressing steps patient completed: Don/Doff right sock;Don/Doff left sock Lower body dressing/undressing: 0: Wears gown/pajamas-no public clothing  FIM - Toileting Toileting steps completed by patient: Performs perineal hygiene Toileting Assistive Devices: Grab bar or rail for support Toileting: 5: Supervision: Safety issues/verbal cues  FIM - Diplomatic Services operational officerToilet Transfers Toilet Transfers Assistive Devices: PsychiatristBedside commode Toilet Transfers: 5-To toilet/BSC: Supervision (verbal cues/safety issues);5-From toilet/BSC: Supervision (verbal cues/safety issues)  FIM - Press photographerBed/Chair Transfer Bed/Chair Transfer Assistive Devices: Arm rests;Walker Bed/Chair Transfer: 5: Bed > Chair or W/C: Supervision (verbal cues/safety issues);5: Chair or W/C > Bed: Supervision (verbal cues/safety issues)  FIM - Locomotion: Wheelchair Distance: 150 Locomotion: Wheelchair: 0: Activity did not occur FIM - Locomotion: Ambulation Locomotion: Ambulation Assistive Devices: Designer, industrial/productWalker - Rolling Ambulation/Gait Assistance: 5: Supervision Locomotion: Ambulation: 5: Travels 150 ft or more with supervision/safety issues  Comprehension Comprehension Mode: Auditory Comprehension: 5-Follows basic conversation/direction: With no  assist  Expression Expression Mode: Nonverbal Expression Assistive Devices: 6-Communication board  Expression: 2-Expresses basic 25 - 49% of the time/requires cueing 50 - 75% of the time. Uses single words/gestures.  Social Interaction Social Interaction: 4-Interacts appropriately 75 - 89% of the time - Needs redirection for appropriate language or to initiate interaction.  Problem Solving Problem Solving: 5-Solves basic 90% of the time/requires cueing < 10% of the time  Memory Memory: 4-Recognizes or recalls 75 - 89% of the time/requires cueing 10 - 24% of the time  Medical Problem List and Plan:  1. Functional deficits secondary to debilitation after cardiac arrest/respiratory failure.  2. 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   -continue #6 cuffed trach for now with cuff up per CCS  -IS/FV  -pulmonary toilet  -continue augmentin  -has ongoing low grade temp likely atelectasis    2. DVT Prophylaxis/Anticoagulation: Subcutaneous heparin. Monitor platelet counts and any signs of bleeding  3. Pain Management: Tylenol as needed  4.  Severe Oral-pharyngeal --   -G-tube placed and he's tolerating TF 5. Neuropsych: This patient is capable of making decisions on his own behalf.   -  lexapro qhs for reactive derpession 6. Skin/Wound Care: Routine skin care. Trach care as directed  7. Fluids/Electrolytes/Nutrition:  Following weights and I's and o's  8. CAD/CABG. Patient has refused cardiac catheterization in the past  9. Diastolic congestive heart failure. Continue Lasix as directed.     -weights trending back up. Doesn't appear fluid overloaded---observe for now 10. Diabetes mellitus with peripheral neuropathy.   -increased lantus to 60u qam and 60u qpm with some improvement---continue to follow   11. Tobacco abuse. Provide counseling   12. Chronic renal insufficiency. Baseline creatinine 1.5-2.   13. Extensive sinus disease left mastoid. Continue Augmentin for time  being

## 2014-08-15 ENCOUNTER — Inpatient Hospital Stay (HOSPITAL_COMMUNITY): Payer: Medicaid Other | Admitting: Speech Pathology

## 2014-08-15 ENCOUNTER — Encounter (HOSPITAL_COMMUNITY): Payer: Medicaid Other

## 2014-08-15 ENCOUNTER — Inpatient Hospital Stay (HOSPITAL_COMMUNITY): Payer: Medicaid Other

## 2014-08-15 DIAGNOSIS — Z930 Tracheostomy status: Secondary | ICD-10-CM

## 2014-08-15 DIAGNOSIS — E1165 Type 2 diabetes mellitus with hyperglycemia: Secondary | ICD-10-CM

## 2014-08-15 DIAGNOSIS — J449 Chronic obstructive pulmonary disease, unspecified: Secondary | ICD-10-CM

## 2014-08-15 DIAGNOSIS — R5381 Other malaise: Secondary | ICD-10-CM

## 2014-08-15 DIAGNOSIS — G9340 Encephalopathy, unspecified: Secondary | ICD-10-CM

## 2014-08-15 DIAGNOSIS — J0410 Acute tracheitis without obstruction: Secondary | ICD-10-CM

## 2014-08-15 DIAGNOSIS — F341 Dysthymic disorder: Secondary | ICD-10-CM

## 2014-08-15 LAB — GLUCOSE, CAPILLARY
GLUCOSE-CAPILLARY: 251 mg/dL — AB (ref 70–99)
GLUCOSE-CAPILLARY: 254 mg/dL — AB (ref 70–99)
Glucose-Capillary: 229 mg/dL — ABNORMAL HIGH (ref 70–99)
Glucose-Capillary: 214 mg/dL — ABNORMAL HIGH (ref 70–99)
Glucose-Capillary: 231 mg/dL — ABNORMAL HIGH (ref 70–99)
Glucose-Capillary: 193 mg/dL — ABNORMAL HIGH (ref 70–99)

## 2014-08-15 MED ORDER — INSULIN GLARGINE 100 UNIT/ML ~~LOC~~ SOLN
65.0000 [IU] | Freq: Every day | SUBCUTANEOUS | Status: DC
  Administered 2014-08-15 – 2014-08-16 (×2): 65 [IU] via SUBCUTANEOUS
  Filled 2014-08-15 (×3): qty 0.65

## 2014-08-15 MED ORDER — INSULIN GLARGINE 100 UNIT/ML ~~LOC~~ SOLN
65.0000 [IU] | Freq: Every day | SUBCUTANEOUS | Status: DC
  Administered 2014-08-16: 65 [IU] via SUBCUTANEOUS
  Filled 2014-08-15 (×2): qty 0.65

## 2014-08-15 NOTE — Progress Notes (Signed)
Physical Therapy Session Note  Patient Details  Name: Richard [name] MRN: [id number] Date of Birth: 02-19-1961  Today's Date: 08/15/2014 PT Individual Time:  -      Short Term Goals: Week 3:  PT Short Term Goal 1 (Week 3): STGs=LTGs due to anticipated LOS  Skilled Therapeutic Interventions/Progress Updates:  1:1. Pt received sitting in recliner, shaking head "no" to participation in therapy. Pt req encouragement to use communication board for explanation. Pt writing, "I can't breath well today." No labored breathing noted and SaO2 reading 99%. Pt req mod encouragement and education regarding benefits of physical therapy for participation in session. Focus this session on functional endurance during ambulation and therex. Pt consistently only agreeable to ambulation and use of NuStep despite attempting to engage pt in variety of other functional tasks to target endurance. Pt with good tolerance to ambulation 250'x1 and 200'x1 with RW, req close(S) due to therapist managing multiple lines. Pt req cues for posture and proximity to RW with minimal reception to feedback. Pt utilized NuStep for B UE/LE strength and endurance, level 5x7510min with good tolerance. Borg rating of 13. Pt req intermittent seated rest breaks due to fatigue. SaO2 >97% on 3L at 28% throughout session. At end of session, pt asking if there was any way that therapist could make his recliner or bed more comfortable. Wheelchair cushion already in place in recliner for pressure relief and improved sitting tolerance. Pt with minimal reception to suggestions regarding positional changes in bed such as sidelying etc. Further emphasis on benefits of mobility for pressure relief, healing and promoting functional endurance vs. Being sedentary. Pt left sitting in recliner at end of session w/ all needs in reach.   Therapy Documentation Precautions:  Precautions Precautions: Fall Precaution Comments: h/o falls PTA per chart, trach and  O2 Restrictions Weight Bearing Restrictions: No   Vital Signs: Therapy Vitals Pulse Rate: 71 Resp: 18 Patient Position (if appropriate): Sitting Oxygen Therapy SpO2: 100 % O2 Device: Trach collar O2 Flow Rate (L/min): 5 L/min FiO2 (%): 28 % Pain: Pain Assessment Pain Assessment: No/denies pain  See FIM for current functional status  Therapy/Group: Individual Therapy  [name], Khadijah Mastrianni S 08/15/2014, 2:57 PM

## 2014-08-15 NOTE — Progress Notes (Signed)
Occupational Therapy Session Note  Patient Details  Name: Richard LoosenJimmie G [name] MRN: [id number] Date of Birth: [birth year]  Today's Date: 08/15/2014 OT Individual Time: [id number] OT Individual Time Calculation (min): 45 min    Short Term Goals: Week 3:  OT Short Term Goal 1 (Week 3): STG=LTG (supervision) secondary to patient awaiting SNF placement  Skilled Therapeutic Interventions/Progress Updates:    Pt engaged in bathing and dressing with sit<>stand from recliner. Pt used BSC X 3  with assistance to manage O2 tubing. Pt utilized AE appropriately for LB bathing. PT continues to decline wearing clothing. Focus on activity tolerance, dynamic standing balance, sit<>stand, transfers, and safety awareness   Therapy Documentation Precautions:  Precautions Precautions: Fall Precaution Comments: h/o falls PTA per chart, trach and O2 Restrictions Weight Bearing Restrictions: No Pain: Pain Assessment Pain Assessment: No/denies pain  See FIM for current functional status  Therapy/Group: Individual Therapy  [name], Richard [name] 08/15/2014, 9:45 AM

## 2014-08-15 NOTE — Progress Notes (Signed)
[place] PHYSICAL MEDICINE & REHABILITATION     PROGRESS NOTE     Subjective/Complaints:  Poor sleep (nods with Y/N questions), doesn't want sleep meds  A  review of systems has been performed and if not noted above is otherwise negative.   Objective: Vital Signs: Blood pressure 126/59, pulse 78, temperature 99.5 F (37.5 C), temperature source Oral, resp. rate 18, height 5\' 2"  (1.575 m), weight 91.853 kg (202 lb 8 oz), SpO2 98.00%. No results found. No results found for this basename: WBC, HGB, HCT, PLT,  in the last 72 hours No results found for this basename: NA, K, CL, CO, GLUCOSE, BUN, CREATININE, CALCIUM,  in the last 72 hours CBG (last 3)   Recent Labs  08/15/14 0054 08/15/14 0346 08/15/14 0747  GLUCAP 251* 229* 214*    Wt Readings from Last 3 Encounters:  08/14/14 91.853 kg (202 lb 8 oz)  07/26/14 93 kg (205 lb 0.4 oz)  07/26/14 93 kg (205 lb 0.4 oz)    Physical Exam:  Constitutional:  No acute distress.  HENT: orlal mucosa pink/moist  Head: Normocephalic.  Eyes: EOM are normal.  Neck:  #6 cuffed trach in place--nosecretions thru trach , skin looks ok Cardiac rate controlled. No murmurs. Reg rhythm  Respiratory:  No distress. decreased breath sounds.   Upper airway sounds on exam.  GI: Soft. Bowel sounds are normal. He exhibits mild distension. G-tube site clean Neurological: .  Patient is able to provide his name, age and date of birth. Follows simple commands. CN exam grossly intact. UE: 4-delt, 4/5 bicep/tricep/HI. LE's: 3/5 HF, 3+KE, 4- ankles. No gross sensory changes. Good sitting balance.  Psychiatric:  Flat. Appears alert, accurate with head nods Skin: trach site ok   Assessment/Plan: 1. Functional deficits secondary to severe deconditioning after respiratory failure which require 3+ hours per day of interdisciplinary therapy in a comprehensive inpatient rehab setting. Physiatrist is providing close team supervision and 24 hour management of  active medical problems listed below. Physiatrist and rehab team continue to assess barriers to discharge/monitor patient progress toward functional and medical goals.    FIM: FIM - Bathing Bathing Steps Patient Completed: Chest;Right Arm;Left Arm;Abdomen;Front perineal area;Buttocks;Right upper leg;Left upper leg;Right lower leg (including foot);Left lower leg (including foot) Bathing: 5: Supervision: Safety issues/verbal cues  FIM - Upper Body Dressing/Undressing Upper body dressing/undressing: 0: Wears gown/pajamas-no public clothing FIM - Lower Body Dressing/Undressing Lower body dressing/undressing steps patient completed: Don/Doff right sock;Don/Doff left sock Lower body dressing/undressing: 0: Wears gown/pajamas-no public clothing  FIM - Toileting Toileting steps completed by patient: Performs perineal hygiene Toileting Assistive Devices: Grab bar or rail for support Toileting: 5: Supervision: Safety issues/verbal cues  FIM - Diplomatic Services operational officerToilet Transfers Toilet Transfers Assistive Devices: PsychiatristBedside commode Toilet Transfers: 5-To toilet/BSC: Supervision (verbal cues/safety issues);5-From toilet/BSC: Supervision (verbal cues/safety issues)  FIM - Press photographerBed/Chair Transfer Bed/Chair Transfer Assistive Devices: Arm rests;Walker Bed/Chair Transfer: 5: Bed > Chair or W/C: Supervision (verbal cues/safety issues);5: Chair or W/C > Bed: Supervision (verbal cues/safety issues)  FIM - Locomotion: Wheelchair Distance: 150'x2 Locomotion: Wheelchair: 4: Travels 150 ft or more: maneuvers on rugs and over door sillls with minimal assistance (Pt.>75%) FIM - Locomotion: Ambulation Locomotion: Ambulation Assistive Devices: Designer, industrial/productWalker - Rolling Ambulation/Gait Assistance: 5: Supervision Locomotion: Ambulation: 0: Activity did not occur  Comprehension Comprehension Mode: Auditory Comprehension: 5-Follows basic conversation/direction: With extra time/assistive device  Expression Expression Mode:  Nonverbal Expression Assistive Devices: 6-Communication board Expression: 2-Expresses basic 25 - 49% of the time/requires cueing 50 - 75%  of the time. Uses single words/gestures.  Social Interaction Social Interaction: 4-Interacts appropriately 75 - 89% of the time - Needs redirection for appropriate language or to initiate interaction.  Problem Solving Problem Solving: 5-Solves basic 90% of the time/requires cueing < 10% of the time  Memory Memory: 4-Recognizes or recalls 75 - 89% of the time/requires cueing 10 - 24% of the time  Medical Problem List and Plan:  1. Functional deficits secondary to debilitation after cardiac arrest/respiratory failure.  2. 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   -continue #6 cuffed trach for now with cuff up per CCS  -IS/FV  -pulmonary toilet  -continue augmentin  -has ongoing low grade temp    2. DVT Prophylaxis/Anticoagulation: Subcutaneous heparin. Monitor platelet counts and any signs of bleeding  3. Pain Management: Tylenol as needed  4.  Severe Oral-pharyngeal --   -G-tube placed and he's tolerating TF 5. Neuropsych: This patient is capable of making decisions on his own behalf.   -  lexapro qhs for reactive derpession 6. Skin/Wound Care: Routine skin care. Trach care as directed  7. Fluids/Electrolytes/Nutrition:  Following weights and I's and o's  8. CAD/CABG. Patient has refused cardiac catheterization in the past  9. Diastolic congestive heart failure. Continue Lasix as directed.     -weights trending back up. Doesn't appear fluid overloaded---observe for now 10. Diabetes mellitus with peripheral neuropathy.   -increase lantus to 65u qam and 65u qpm     11. Tobacco abuse. Provide counseling   12. Chronic renal insufficiency. Baseline creatinine 1.5-2.   13. Extensive sinus disease left mastoid. Continue Augmentin for time being

## 2014-08-15 NOTE — Progress Notes (Signed)
Per note of MD Riley KillSwartz to continue with trach for now with cuff up per CCS. Went to inflate cuff, within a few minutes of cuff being inflated and pt on ATC pt stated that he felt like he could not breath and he wanted cuff deflated. Cuff was deflated pt remained 100% throughout. Pt felt better once cuff was deflated. RN aware.

## 2014-08-15 NOTE — Progress Notes (Signed)
Speech Language Pathology Daily Session Note  Patient Details  Name: Richard LoosenJimmie G [name] MRN: [id number] Date of Birth: 12/25/1960  Today's Date: 08/15/2014 SLP Individual Time: 1000-1030 SLP Individual Time Calculation (min): 30 min  Short Term Goals: Week 3: SLP Short Term Goal 1 (Week 3): Pt will complete pharyngeal strengthening exercise to maximize swallowing function with Min cues SLP Short Term Goal 2 (Week 3): Pt will recall daily information with use of external memory aids with Min multimodal cues SLP Short Term Goal 3 (Week 3): Pt will utilize multimodal communication to express wants/needs with Mod multimodal cues  Skilled Therapeutic Interventions: Skilled treatment session focused on functional communication. Upon arrival, patient was sitting upright in chair with vitals WFL, however, the cuff on his #6 cuffed trach was deflated. RN and PA notified.  SLP facilitated session by providing Mod A question and verbal cues for utilization of a written aid to increase functional communication in regards to wants/needs.  Patient utilized a dry erase board to communicate at the phrase and sentence level and recalled events from this weekend with Mod I (daughters visiting, etc.).  Patient left in chair with all needs within reach. Continue with current plan of care.    FIM:  Comprehension Comprehension Mode: Auditory Comprehension: 5-Follows basic conversation/direction: With extra time/assistive device Expression Expression Mode: Nonverbal Expression: 3-Expresses basic 50 - 74% of the time/requires cueing 25 - 50% of the time. Needs to repeat parts of sentences. Social Interaction Social Interaction: 4-Interacts appropriately 75 - 89% of the time - Needs redirection for appropriate language or to initiate interaction. Problem Solving Problem Solving: 5-Solves basic 90% of the time/requires cueing < 10% of the time Memory Memory: 4-Recognizes or recalls 75 - 89% of the time/requires  cueing 10 - 24% of the time  Pain Pain Assessment Pain Assessment: No/denies pain  Therapy/Group: Individual Therapy  Naryiah Schley 08/15/2014, 11:45 AM

## 2014-08-15 NOTE — Progress Notes (Signed)
Name: Richard LoosenJimmie G [name] MRN: [id number] DOB: Jun 22, 1961    ADMISSION DATE:  07/26/2014 CONSULTATION DATE:  07/28/14  REFERRING MD :  Hermelinda MedicusSchwartz   CHIEF COMPLAINT:  Trach mgmt   BRIEF PATIENT DESCRIPTION: 53 y/o male with hx dCHF, COPD, DM, CKD [facility]14 with AMS, hyperkalemia and developed VF/VT arrest.  Course was c/b failed extubation requiring immediate re-intubation (now s/p trach), CAD for which he refused cath, volume overload, and significant dysphagia.  He was tx [facility] 9/29 for further rehab efforts.  He cont to have difficulties with dysphagia, increased secretions and low grade fevers and PCCM asked to re-eval.   SIGNIFICANT EVENTS  10/19 - secretions improved  STUDIES:  FEES 10/1>> severe dysphagia with silent aspiration  10/6 sputum>> neg   SUBJECTIVE:  Secretions have improved per pt.  He informed RT that it was uncomfortable for him to breathe with cuff inflated and requested cuff to be deflated.  Appears comfortable during my exam.  VITAL SIGNS: Temp:  [98.6 F (37 C)-99.5 F (37.5 C)] 99.5 F (37.5 C) (10/19 0603) Pulse Rate:  [71-78] 71 (10/19 1149) Resp:  [16-20] 18 (10/19 1149) BP: (122-126)/(58-59) 126/59 mmHg (10/19 0603) SpO2:  [93 %-100 %] 100 % (10/19 1149) FiO2 (%):  [28 %] 28 % (10/19 1149)  PHYSICAL EXAMINATION: General:  Chronically ill appearing male, NAD. Sitting in chair. PMV off Neuro:  Alert and interactive, MAE, tries to communicate. HEENT:  [place]/AT, PERRL, MMM.  Trach site C/D/I, no obvious secretions noted. Cardiovascular:  RRR, no M/R/G. Lungs:  Diminished bilaterally.  Coarse rhonchi scattered. Abdomen:  Soft, NT, ND and +BS. Musculoskeletal: 1+ pitting edema.  No gross deformities. Skin:  Intact.   PULMONARY No results found for this basename: PHART, PCO2, PCO2ART, PO2, PO2ART, HCO3, TCO2, O2SAT,  in the last 168 hours  CBC No results found for this basename: HGB, HCT, WBC,  PLT,  in the last 168 hours  COAGULATION No results found for this basename: INR,  in the last 168 hours  CARDIAC  No results found for this basename: TROPONINI,  in the last 168 hours No results found for this basename: PROBNP,  in the last 168 hours   CHEMISTRY No results found for this basename: NA, K, CL, CO2, GLUCOSE, BUN, CREATININE, CALCIUM, MG, PHOS,  in the last 168 hours Estimated Creatinine Clearance: 103.8 ml/min (by C-G formula based on Cr of 0.67).   LIVER No results found for this basename: AST, ALT, ALKPHOS, BILITOT, PROT, ALBUMIN, INR,  in the last 168 hours   INFECTIOUS No results found for this basename: LATICACIDVEN, PROCALCITON,  in the last 168 hours   ENDOCRINE CBG (last 3)   Recent Labs  08/15/14 0346 08/15/14 0747 08/15/14 1134  GLUCAP 229* 214* 231*    IMAGING x48h No results found.   No results found.  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  Failed extubation 9/23 with stridor requiring immediate reintubation. Now tol ATC at all times, #6 cuffed shiley.   REC -  Albuterol PRN. Would continue to hold off on changing to cuffless trach for now given recent hx of moderate secretions and risk for occult aspiration.  If continues to do well and secretions remain minimal with cuff down, then could consider changing trach. Would not downsize or place PMV until secretion status fully resolved. Pulm hygiene. Keep dry as tolerated. Cont abx per primary for extensive sinus disease (Augmentin)  Continue scopolamine patch. Continue feeding via panda.  CXR intermittently.  Chronic diastolic CHF  Volume Overload   REC -  Continue lasix 20 QD. Follow up BMP / CBC in AM (note last one was 10/7).  Discussion:  Secretions improved this AM per pt.  Feels uncomfortable with cuff inflated therefore requested that it be deflated.  Would continue to monitor closely with cuff deflated and if secretions continue to improve and pt doing well clinically, then could consider changing to cuffless.  No downsize until secretions completely resolved.  Check CXR in AM given concern / risk for occult aspiration.   Rutherford Guysahul Desai, PA - C [place] [facility] Pgr: [phone]  or [phone]  Levy Pupaobert Joane Postel, MD, PhD 08/17/2014, 7:47 AM  Pulmonary and Critical Care [phone] or if no answer [phone]

## 2014-08-16 ENCOUNTER — Encounter (HOSPITAL_COMMUNITY): Payer: Medicaid Other

## 2014-08-16 ENCOUNTER — Inpatient Hospital Stay (HOSPITAL_COMMUNITY): Payer: Medicaid Other

## 2014-08-16 LAB — BASIC METABOLIC PANEL
ANION GAP: 14 (ref 5–15)
BUN: 26 mg/dL — ABNORMAL HIGH (ref 6–23)
CO2: 29 mEq/L (ref 19–32)
Calcium: 9.5 mg/dL (ref 8.4–10.5)
Chloride: 96 mEq/L (ref 96–112)
Creatinine, Ser: 0.8 mg/dL (ref 0.50–1.35)
GFR calc Af Amer: >90 mL/min (ref >90)
GLUCOSE: 178 mg/dL — AB (ref 70–99)
Potassium: 4.2 mEq/L (ref 3.7–5.3)
SODIUM: 139 meq/L (ref 137–147)

## 2014-08-16 LAB — CBC
HCT: 36.8 % — ABNORMAL LOW (ref 39.0–52.0)
Hemoglobin: 12.2 g/dL — ABNORMAL LOW (ref 13.0–17.0)
MCH: 29.3 pg (ref 26.0–34.0)
MCHC: 33.2 g/dL (ref 30.0–36.0)
MCV: 88.2 fL (ref 78.0–100.0)
PLATELETS: 390 10*3/uL (ref 150–400)
RBC: 4.17 MIL/uL — ABNORMAL LOW (ref 4.22–5.81)
RDW: 16.8 % — AB (ref 11.5–15.5)
WBC: 8.2 10*3/uL (ref 4.0–10.5)

## 2014-08-16 LAB — GLUCOSE, CAPILLARY
GLUCOSE-CAPILLARY: 122 mg/dL — AB (ref 70–99)
GLUCOSE-CAPILLARY: 182 mg/dL — AB (ref 70–99)
GLUCOSE-CAPILLARY: 257 mg/dL — AB (ref 70–99)
Glucose-Capillary: 181 mg/dL — ABNORMAL HIGH (ref 70–99)
Glucose-Capillary: 210 mg/dL — ABNORMAL HIGH (ref 70–99)
Glucose-Capillary: 108 mg/dL — ABNORMAL HIGH (ref 70–99)
Glucose-Capillary: 120 mg/dL — ABNORMAL HIGH (ref 70–99)

## 2014-08-16 MED ORDER — PRO-STAT SUGAR FREE PO LIQD
30.0000 mL | Freq: Every day | ORAL | Status: DC
  Administered 2014-08-16 – 2014-08-22 (×7): 30 mL
  Filled 2014-08-16 (×8): qty 30

## 2014-08-16 MED ORDER — GLUCERNA 1.2 CAL PO LIQD
1000.0000 mL | ORAL | Status: DC
  Administered 2014-08-16 – 2014-08-22 (×9): 1000 mL
  Filled 2014-08-16 (×24): qty 1000

## 2014-08-16 NOTE — Progress Notes (Signed)
Physical Therapy Session Note  Patient Details  Name: Richard [name] MRN: [id number] Date of Birth: [birth year]  Today's Date: 08/16/2014 PT Individual Time: 1110-1135 PT Individual Time Calculation (min): 25 min   Short Term Goals: Week 1:  PT Short Term Goal 1 (Week 1): Pt will perform all aspects of bed mobility at min A level with HOB flat and without rails.  PT Short Term Goal 1 - Progress (Week 1): Discontinued (comment) (Pt reports that he sleeps in a recliner) PT Short Term Goal 2 (Week 1): Pt will perform stand pivot transfers at S level PT Short Term Goal 2 - Progress (Week 1): Met PT Short Term Goal 3 (Week 1): Pt will ambulate x 50' w/ LRAD at S level with SaO2 >90%.   PT Short Term Goal 3 - Progress (Week 1): Progressing toward goal  Skilled Therapeutic Interventions/Progress Updates:  1:1. Pt received sitting in recliner, initially shaking head "no" to participation in therapy. Pt req encouragement to utilize communication board for explanation writing, "breathing is difficult." No labored breathing noted, SaO2 at 99% in room and RN aware. Pt agreeable to participation with min encouragement. Supervision for toileting at start of session. Focus this session on functional endurance during mobility. Pt with fair tolerance to amb 250'x2 with RW as well as negotiated up/down 8 steps with B UE on single rail using step-to pattern, req close(S) overall due to therapist managing O2 tubing. Pt req cues for safety due to mild impulsivity during tasks in order to complete them more quickly. Pt req seated rest breaks during session due to fatigue. Despite encouragement, pt declined further participation in session due to fatigue. Pt left sitting in recliner at end of session w/ all needs in reach.   Therapy Documentation Precautions:  Precautions Precautions: Fall Precaution Comments: h/o falls PTA per chart, trach and O2 Restrictions Weight Bearing Restrictions: No   Vital  Signs: Therapy Vitals Pulse Rate: 93 (during PT session) Oxygen Therapy SpO2: 96 % O2 Device: Trach collar O2 Flow Rate (L/min): 3 L/min FiO2 (%): 28 % Pain: Pain Assessment Pain Assessment: No/denies pain  See FIM for current functional status  Therapy/Group: Individual Therapy  Richard [name] 08/16/2014, 11:46 AM

## 2014-08-16 NOTE — Progress Notes (Signed)
Occupational Therapy Session Note  Patient Details  Name: Cyril LoosenJimmie G Lindor MRN: [id number] Date of Birth: Nov 12, 1960  Today's Date: 08/16/2014 OT Individual Time: [id number] OT Individual Time Calculation (min): 45 min    Short Term Goals: Week 3:  OT Short Term Goal 1 (Week 3): STG=LTG (supervision) secondary to patient awaiting SNF placement  Skilled Therapeutic Interventions/Progress Updates:    Pt engaged in bathing and dressing with sit<>stand from recliner. Pt used BSC X 2 with assistance to manage O2 tubing. Pt utilized AE appropriately for LB bathing. PT continues to decline wearing clothing. Focus on activity tolerance, dynamic standing balance, sit<>stand, transfers, and safety awareness.   Therapy Documentation Precautions:  Precautions Precautions: Fall Precaution Comments: h/o falls PTA per chart, trach and O2 Restrictions Weight Bearing Restrictions: No Pain: Pain Assessment Pain Assessment: No/denies pain  See FIM for current functional status  Therapy/Group: Individual Therapy  [name], Socrates Cahoon Chappell 08/16/2014, 9:45 AM

## 2014-08-16 NOTE — Progress Notes (Addendum)
NUTRITION FOLLOW-UP  INTERVENTION: Discontinue Vital AF 1.2 formula.   Initiate Glucerna 1.2 Cal formula per PEG tube at 45 ml and increase by 10 ml every 4 hours to goal rate of 85 ml/hr for 20 hours (in anticipation of therapy) with 30 ml Prostat per tube once daily.  Continue 200 ml free water every 6 hours.  Tube feeding regimen provides 2140 kcal, 117 grams of protein, and 1428 ml of H2O. Total free water: 2228 ml  Will continue to monitor.  NUTRITION DIAGNOSIS: Inadequate oral intake related to inability to eat as evidenced by NPO status; ongoing  Goal: Pt to meet >/= 90% of their estimated nutrition needs; met  Monitor:  TF tolerance, weight trends, labs  53 y.o. male  Admitting Dx: Weakness  ASSESSMENT: 10/1-Pt admitted from [facility] for hyperkalemia and AMS. Pt has cardiac arrest while undergoing echocardiogram. Pt intubated and had difficulty with extubation. Pt had trach placed 9/24.  Pt failed swallow evaluation 10/1 and now plans to have feeding tube placed and to start enteral nutrition. No signs of fat or muscle depletion.   10/7- Panda tube came out ("coughed out") last night.  Panda tube was not replaced as pt is agreeable to PEG tube placement. TF has been stopped/discontinued for now. Prior to tube coming out RN reports pt has been tolerating his feedings well. Pt with continue NPO status. IR PA to see pt to discuss PEG tube placement procedure. Recommend continuing TF regimen once PEG tube is placed and stable for use.  10/9- PEG tube placed 10/8. RD consulted for TF management and initiation. To start TF once PEG tube if ready and stable for use per IR. Per RN, TF was started at 1100. Discussed with RN about TF regimen.  10/13- Spoke with RN, pt has been tolerating his TF well. Pt with very little to no residuals. Spoke with pt, he reports no abdominal pains or any other problems.  10/20- RD consulted for TF formula change to assist with glycemic  control. CBG's 178-257 mg/dL. Pt has been tolerating his tube feeding well. TF has been stopped this AM in anticipation for TF formula change. Will change TF formula to Glucerna 1.2 Cal, which has a unique carbohydrate blend to help minimize blood glucose responses. Will monitor its effectiveness. Spoke with RN about TF recommendation changes.  Height: Ht Readings from Last 1 Encounters:  07/26/14 $RemoveB'5\' 2"'[id number]$  (1.575 m)    Weight: Wt Readings from Last 1 Encounters:  08/15/14 204 lb 14.4 oz (92.942 kg)    BMI:  Body mass index is 37.47 kg/(m^2).  Re-Estimated Nutritional Needs: Kcal: 2000-2200 Protein: 110-125 grams Fluid: > 2 L/day  Skin: +1 generalized edema, incision on right leg  Diet Order: NPO   Intake/Output Summary (Last 24 hours) at 08/16/14 1018 Last data filed at 08/16/14 0600  Gross per 24 hour  Intake   1650 ml  Output      0 ml  Net   1650 ml    Last BM: 10/18  Labs:   Recent Labs Lab 08/16/14 0745  NA 139  K 4.2  CL 96  CO2 29  BUN 26*  CREATININE 0.80  CALCIUM 9.5  GLUCOSE 178*    CBG (last 3)   Recent Labs  08/16/14 0027 08/16/14 0422 08/16/14 0849  GLUCAP 257* 181* 210*    Scheduled Meds: . amoxicillin-clavulanate  1 tablet Per Tube Q12H  . antiseptic oral rinse  7 mL Mouth Rinse QID  . atorvastatin  80 mg Per Tube q1800  . chlorhexidine  15 mL Mouth Rinse BID  . escitalopram  10 mg Per Tube QHS  . free water  200 mL Per Tube 4 times per day  . furosemide  20 mg Per Tube Daily  . heparin  5,000 Units Subcutaneous 3 times per day  . insulin aspart  0-20 Units Subcutaneous 6 times per day  . insulin glargine  65 Units Subcutaneous QHS  . insulin glargine  65 Units Subcutaneous Daily  . pantoprazole sodium  40 mg Per Tube Daily  . scopolamine  1 patch Transdermal Q72H    Continuous Infusions: . feeding supplement (VITAL AF 1.2 CAL) Stopped (08/16/14 0850)    Past Medical History  Diagnosis Date  . CAD (coronary artery disease)  of artery bypass graft 07/11/2014  . Cardiac arrest 07/11/2014  . CHF (congestive heart failure) 07/11/2014  . HTN (hypertension) 07/11/2014  . COPD (chronic obstructive pulmonary disease) 07/11/2014  . DM (diabetes mellitus) 07/11/2014  . GERD (gastroesophageal reflux disease) 07/11/2014  . Acute on chronic kidney failure 07/11/2014    History reviewed. No pertinent past surgical history.  Kallie Locks, MS, RD, LDN Pager # [phone] After hours/ weekend pager # [phone]

## 2014-08-16 NOTE — Progress Notes (Signed)
[place] [facility] PHYSICAL MEDICINE & REHABILITATION     PROGRESS NOTE     Subjective/Complaints:  No new issues. Up in chair as usual. Denies pain. Sleep fair. Secretions better  A  review of systems has been performed and if not noted above is otherwise negative.   Objective: Vital Signs: Blood pressure 129/59, pulse 69, temperature 98.4 F (36.9 C), temperature source Oral, resp. rate 16, height 5\' 2"  (1.575 m), weight 92.942 kg (204 lb 14.4 oz), SpO2 100.00%. Dg Chest Port 1 View  08/16/2014   CLINICAL DATA:  Tracheostomy dependence, personal history coronary artery disease post CABG and cardiac arrest, CHF, hypertension, diabetes mellitus, COPD, acute on chronic renal failure, GERD  EXAM: PORTABLE CHEST - 1 VIEW  COMPARISON:  Portable exam 0633 hr compared to 08/03/2014  FINDINGS: Tracheostomy tube stable.  Enlargement of cardiac silhouette post median sternotomy.  Slight pulmonary vascular cephalization.  Mediastinal contours normal.  Chronic accentuation of RIGHT upper lobe markings little changed.  No definite acute infiltrate, pleural effusion or pneumothorax.  Bones demineralized.  IMPRESSION: Enlargement of cardiac silhouette with slight pulmonary vascular cephalization and postsurgical changes of median sternotomy.  No definite acute infiltrate.   Electronically Signed   By: Ulyses SouthwardMark  Boles M.D.   On: 08/16/2014 08:19    Recent Labs  08/16/14 0745  WBC 8.2  HGB 12.2*  HCT 36.8*  PLT 390   No results found for this basename: NA, K, CL, CO, GLUCOSE, BUN, CREATININE, CALCIUM,  in the last 72 hours CBG (last 3)   Recent Labs  08/15/14 1958 08/16/14 0027 08/16/14 0422  GLUCAP 254* 257* 181*    Wt Readings from Last 3 Encounters:  08/15/14 92.942 kg (204 lb 14.4 oz)  07/26/14 93 kg (205 lb 0.4 oz)  07/26/14 93 kg (205 lb 0.4 oz)    Physical Exam:  Constitutional:  No acute distress.  HENT: orlal mucosa pink/moist  Head: Normocephalic.  Eyes: EOM are normal.  Neck:  #6  cuffed trach in place--minimal secretions thru trach , skin looks ok Cardiac rate controlled. No murmurs. Reg rhythm  Respiratory:  No distress. decreased breath sounds.   Upper airway sounds on exam.  GI: Soft. Bowel sounds are normal. He exhibits mild distension. G-tube site clean Neurological: .  Patient is able to provide his name, age and date of birth. Follows simple commands. CN exam grossly intact. UE: 4/5delt, 4/5 bicep/tricep/HI. LE's: 3/5 HF, 3+KE, 4 ankles. No gross sensory changes. Good sitting balance.  Psychiatric:  Flat. Appears alert, accurate with head nods Skin: trach site ok   Assessment/Plan: 1. Functional deficits secondary to severe deconditioning after respiratory failure which require 3+ hours per day of interdisciplinary therapy in a comprehensive inpatient rehab setting. Physiatrist is providing close team supervision and 24 hour management of active medical problems listed below. Physiatrist and rehab team continue to assess barriers to discharge/monitor patient progress toward functional and medical goals.    FIM: FIM - Bathing Bathing Steps Patient Completed: Chest;Right Arm;Left Arm;Abdomen;Front perineal area;Buttocks;Right upper leg;Left upper leg;Right lower leg (including foot);Left lower leg (including foot) Bathing: 5: Supervision: Safety issues/verbal cues  FIM - Upper Body Dressing/Undressing Upper body dressing/undressing: 0: Wears gown/pajamas-no public clothing FIM - Lower Body Dressing/Undressing Lower body dressing/undressing steps patient completed: Don/Doff right sock;Don/Doff left sock Lower body dressing/undressing: 0: Wears gown/pajamas-no public clothing  FIM - Toileting Toileting steps completed by patient: Performs perineal hygiene Toileting Assistive Devices: Grab bar or rail for support Toileting: 5: Supervision: Safety issues/verbal  cues  FIM - Diplomatic Services operational officerToilet Transfers Toilet Transfers Assistive Devices: Bedside commode Toilet  Transfers: 5-To toilet/BSC: Supervision (verbal cues/safety issues);5-From toilet/BSC: Supervision (verbal cues/safety issues)  FIM - Press photographerBed/Chair Transfer Bed/Chair Transfer Assistive Devices: Arm rests;Walker Bed/Chair Transfer: 5: Bed > Chair or W/C: Supervision (verbal cues/safety issues);5: Chair or W/C > Bed: Supervision (verbal cues/safety issues)  FIM - Locomotion: Wheelchair Distance: 150'x2 Locomotion: Wheelchair: 0: Activity did not occur FIM - Locomotion: Ambulation Locomotion: Ambulation Assistive Devices: Designer, industrial/productWalker - Rolling Ambulation/Gait Assistance: 5: Supervision Locomotion: Ambulation: 5: Travels 150 ft or more with supervision/safety issues  Comprehension Comprehension Mode: Auditory Comprehension: 5-Follows basic conversation/direction: With no assist  Expression Expression Mode: Nonverbal Expression Assistive Devices: 6-Communication board Expression: 3-Expresses basic 50 - 74% of the time/requires cueing 25 - 50% of the time. Needs to repeat parts of sentences.  Social Interaction Social Interaction: 4-Interacts appropriately 75 - 89% of the time - Needs redirection for appropriate language or to initiate interaction.  Problem Solving Problem Solving: 5-Solves complex 90% of the time/cues < 10% of the time  Memory Memory: 4-Recognizes or recalls 75 - 89% of the time/requires cueing 10 - 24% of the time  Medical Problem List and Plan:  1. Functional deficits secondary to debilitation after cardiac arrest/respiratory failure.  2. 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   -continue #6 cuffed trach for now with cuff up per CCS  -IS/FV  -pulmonary toilet  -continue augmentin    2. DVT Prophylaxis/Anticoagulation: Subcutaneous heparin. Monitor platelet counts and any signs of bleeding  3. Pain Management: Tylenol as needed  4.  Severe Oral-pharyngeal --   -G-tube placed and he's tolerating TF 5. Neuropsych: This patient is capable of making decisions on  his own behalf.   -  lexapro qhs for reactive derpession 6. Skin/Wound Care: Routine skin care. Trach care as directed  7. Fluids/Electrolytes/Nutrition:  Following weights and I's and o's  8. CAD/CABG. Patient has refused cardiac catheterization in the past  9. Diastolic congestive heart failure. Continue Lasix as directed.     -weights trending back up. Doesn't appear fluid overloaded---cxr pending 10. Diabetes mellitus with peripheral neuropathy.   -increased lantus to 65u qam and 65u qpm    -will ask RD about changed to glucerna or similar TF formula   11. Tobacco abuse. Provide counseling   12. Chronic renal insufficiency. Baseline creatinine 1.5-2.   13. Extensive sinus disease left mastoid. Continue Augmentin for time being

## 2014-08-16 NOTE — Progress Notes (Signed)
Inpatient Diabetes Program Recommendations  AACE/ADA: New Consensus Statement on Inpatient Glycemic Control (2013)  Target Ranges:  Prepandial:   less than 140 mg/dL      Peak postprandial:   less than 180 mg/dL (1-2 hours)      Critically ill patients:  140 - 180 mg/dL   Results for Richard [name], Richard [name] (MRN [id number]) as of 08/16/2014 10:34  Ref. Range 08/15/2014 00:54 08/15/2014 03:46 08/15/2014 07:47 08/15/2014 11:34 08/15/2014 16:33 08/15/2014 19:58 08/16/2014 00:27 08/16/2014 04:22 08/16/2014 08:49  Glucose-Capillary Latest Range: 70-99 mg/dL 409251 (H) 811229 (H) 914214 (H) 231 (H) 193 (H) 254 (H) 257 (H) 181 (H) 210 (H)   Diabetes history: DM2  Outpatient Diabetes medications: Lantus 40 units BID, Novolog 0-20 units Q4H  Current orders for Inpatient glycemic control: Lantus 65 units BID, Novolog 0-20 units Q4H  Inpatient Diabetes Program Recommendations Insulin - Tube Feeding Coverage: Please consider ordering Novolog 3 units Q4H for tube feeding coverage. Diet: Question if changing Vital to Glucerna would be less carbohydrates. Please consider discussing with RD.  Note: Noted Lantus was increased from 60 to 65 units BID yesterday. However, in reviewing the chart noted patient did not receive any Lantus yesterday morning.   Thanks, Orlando PennerMarie Mariabella Nilsen, RN, MSN, CCRN Diabetes Coordinator Inpatient Diabetes Program [phone] (Team Pager) [phone] ([facility] office) [phone] [facility] office)

## 2014-08-17 ENCOUNTER — Encounter (HOSPITAL_COMMUNITY): Payer: Medicaid Other

## 2014-08-17 ENCOUNTER — Inpatient Hospital Stay (HOSPITAL_COMMUNITY): Payer: Medicaid Other | Admitting: Speech Pathology

## 2014-08-17 ENCOUNTER — Inpatient Hospital Stay (HOSPITAL_COMMUNITY): Payer: Medicaid Other

## 2014-08-17 LAB — GLUCOSE, CAPILLARY
GLUCOSE-CAPILLARY: 169 mg/dL — AB (ref 70–99)
Glucose-Capillary: 123 mg/dL — ABNORMAL HIGH (ref 70–99)
Glucose-Capillary: 153 mg/dL — ABNORMAL HIGH (ref 70–99)
Glucose-Capillary: 165 mg/dL — ABNORMAL HIGH (ref 70–99)
Glucose-Capillary: 172 mg/dL — ABNORMAL HIGH (ref 70–99)
Glucose-Capillary: 221 mg/dL — ABNORMAL HIGH (ref 70–99)

## 2014-08-17 MED ORDER — INSULIN GLARGINE 100 UNIT/ML ~~LOC~~ SOLN
60.0000 [IU] | Freq: Every day | SUBCUTANEOUS | Status: DC
  Administered 2014-08-17 – 2014-08-18 (×2): 60 [IU] via SUBCUTANEOUS
  Filled 2014-08-17 (×3): qty 0.6

## 2014-08-17 MED ORDER — INSULIN GLARGINE 100 UNIT/ML ~~LOC~~ SOLN
60.0000 [IU] | Freq: Every day | SUBCUTANEOUS | Status: DC
  Administered 2014-08-18 – 2014-08-19 (×2): 60 [IU] via SUBCUTANEOUS
  Filled 2014-08-17 (×2): qty 0.6

## 2014-08-17 NOTE — Patient Care Conference (Signed)
Inpatient RehabilitationTeam Conference and Plan of Care Update Date: 10/2o/2015   Time: 2:45 PM    Patient Name: Richard [name]      Medical Record Number: [id number]  Date of Birth: 1960-11-18 Sex: Male         Room/Bed: 4W16C/4W16C-01 Payor Info: Payor: MEDICAID [place] / Plan: MEDICAID  ACCESS / Product Type: *No Product type* /    Admitting Diagnosis: Deconditioned after cardiac arrest  resp failure   Admit Date/Time:  07/26/2014  6:56 PM Admission Comments: No comment available   Primary Diagnosis:  <principal problem not specified> Principal Problem: <principal problem not specified>  Patient Active Problem List   Diagnosis Date Noted  . Tracheitis 07/28/2014  . Tracheostomy status 07/28/2014  . Debilitated 07/26/2014  . Acute on chronic kidney failure 07/11/2014  . Hyperkalemia 07/11/2014  . CHF (congestive heart failure) 07/11/2014  . DM (diabetes mellitus) 07/11/2014  . CAD (coronary artery disease) of artery bypass graft 07/11/2014  . HTN (hypertension) 07/11/2014  . COPD (chronic obstructive pulmonary disease) 07/11/2014  . GERD (gastroesophageal reflux disease) 07/11/2014  . Acute encephalopathy 07/11/2014  . CAP (community acquired pneumonia) 07/11/2014  . Cardiac arrest 07/11/2014    Expected Discharge Date: Expected Discharge Date:  (SNF)  Team Members Present: Physician leading conference: Dr. Faith RogueZachary Swartz Social Worker Present: Amada JupiterLucy Kristoff Coonradt, LCSW Nurse Present: Carlean PurlMaryann Barbour, RN PT Present: Cyndia SkeetersBridgett Ripa, Scot JunPT;Caroline [name], PT OT Present: Ardis Rowanom Lanier, COTA;Jennifer Fredrich RomansSmith, OT;Kayla Perkinson, OT SLP Present: Feliberto Gottronourtney Payne, SLP PPS Coordinator present : Tora DuckMarie Noel, RN, CRRN     Current Status/Progress Goal Weekly Team Focus  Medical   minimal engagement, at goal level, afebrile. still with #6 trach, tolerating TF  improve functional mobility  respiratory care, management   Bowel/Bladder   pt continent of bowel and bladder- uses BSC due to  equipment attached  remain continent of bowel and bladder  contiune use of BSC and regular BMs   Swallow/Nutrition/ Hydration             ADL's   bathing-supervision/min A secondary to patient's refusal to use AE occasionally; transfers-supervision for O2 tube management; max encouragement to participate other than B&D  supervision overall  activity tolerance, transfers, participation   Mobility   Close(S) for standing mobility consistently >150' w/ RW, therapist managing multiple lines. Min-max encouragement to participate in tx sessions  Supervision overall   functional endurance during ambulation and stair negotiation, safe awareness, emergent awareness, strength and balance   Communication             Safety/Cognition/ Behavioral Observations            Pain   no complaints of pain         Skin   skin tear RLE-foam dressing chnaged today  no further skin breakdown with min assist  assess skin qshift     Rehab Goals Patient on target to meet rehab goals: Yes *See Care Plan and progress notes for long and short-term goals.  Barriers to Discharge: poor engagement, trach/respiratory care    Possible Resolutions to Barriers:  facility which can handle his need    Discharge Planning/Teaching Needs:  Plan changed to SNF - bed search still ongoing      Team Discussion:  secretions some better but still not ready for PMSV.  Pt continues to refuse therapies and not appropriate to continue providing daily sessions.  Plan to stop all therapies and request nursing staff ambulate as he needs.  SW continues to  pursue SNF.  Revisions to Treatment Plan:  D/c all therapies after tomorrow sessions due to refusal to participate   Continued Need for Acute Rehabilitation Level of Care: The patient requires daily medical management by a physician with specialized training in physical medicine and rehabilitation for the following conditions: Daily direction of a multidisciplinary physical  rehabilitation program to ensure safe treatment while eliciting the highest outcome that is of practical value to the patient.: Yes Daily medical management of patient stability for increased activity during participation in an intensive rehabilitation regime.: Yes Daily analysis of laboratory values and/or radiology reports with any subsequent need for medication adjustment of medical intervention for : Post surgical problems;Pulmonary problems;Other  Richard [name] 08/17/2014, 11:31 AM

## 2014-08-17 NOTE — Progress Notes (Signed)
Speech Language Pathology Session Note & Discharge Summary  Patient Details  Name: Richard [name] MRN: [id number] Date of Birth: Apr 08, 1961  Today's Date: 08/17/2014 SLP Individual Time: 7903-8333 SLP Individual Time Calculation (min): 25 min   Skilled Therapeutic Interventions:  Skilled treatment session focused on cognitive-linguistic goals and use of multimodal communication. SLP facilitated session by providing supervision question cues for patient to utilize written expression in order to express mildly complex wants/needs. Patient recalled conversation with physician in regards to discharging therapies at this time with Mod I. Patient also provided education in regards to current cognitive-linguistic, swallowing and speech function as well as reeducated on pharyngeal strengthening exercises. Patient verbalized understanding of all information.   Patient has met 3 of 5 long term goals.  Patient to discharge at overall Supervision level.   Reasons goals not met: Patient continues to require encouragement to participate in dysphagia treatment and requires Mod-Max A for ermegent awareness/insight into deficits and need for participation with therapy.    Clinical Impression/Discharge Summary: Patient has made functional gains and has met 3 of 5 LTG's this admission due to increased memory, problem solving and functional communication. Currently, the patient has a #6 cuffed trach and is tolerating cuff deflation at all times, however, patient continues to have copious secretions and all PMSV trials have been discontinued by the physician. Patient is currently NPO and continues to decline to participate in dysphagia treatment despite max encouragement and multiple attempts.  Due to the fact that the patient cannot wear a PMSV at this time, the patient is utilizing gestures and written expression to communicate wants/needs effectively with staff.  The patient demonstrates appropriate problem solving  and working memory with functional tasks and appears to be close to his cognitive baseline per his family. Patient will be discharged from SLP caseload due to cognitive goals met. Please reorder if/when patient is able to resume PMSV trials.    Care Partner:  Caregiver Able to Provide Assistance: No  Type of Caregiver Assistance: Physical;Cognitive  Recommendation:  24 hour supervision/assistance;Skilled Nursing facility  Rationale for SLP Follow Up: Maximize functional communication;Maximize swallowing safety;Maximize cognitive function and independence;Reduce caregiver burden   Equipment: PMSV   Reasons for discharge: Treatment goals met   Patient/Family Agrees with Progress Made and Goals Achieved: Yes   See FIM for current functional status  [name] 08/17/2014, 12:21 PM

## 2014-08-17 NOTE — Progress Notes (Signed)
Occupational Therapy Session Note  Patient Details  Name: Cyril LoosenJimmie G Hockenbury MRN: [id number] Date of Birth: Sep 27, 1961  Today's Date: 08/17/2014 OT Individual Time: [id number] OT Individual Time Calculation (min): 45 min    Short Term Goals: Week 3:  OT Short Term Goal 1 (Week 3): STG=LTG (supervision) secondary to patient awaiting SNF placement  Skilled Therapeutic Interventions/Progress Updates:    Pt engaged in bathing and dressing with sit<>stand from recliner. Pt used BSC X 1 with assistance to manage O2 tubing. Pt utilized AE appropriately for LB bathing. PT continues to decline wearing clothing but agreed to don pants and shirt this morning for therapy.  Pt removed clothing at end of session. Focus on activity tolerance, dynamic standing balance, sit<>stand, transfers, and safety awareness.      Therapy Documentation Precautions:  Precautions Precautions: Fall Precaution Comments: h/o falls PTA per chart, trach and O2 Restrictions Weight Bearing Restrictions: No     Pain: Pain Assessment Pain Assessment: No/denies pain  See FIM for current functional status  Therapy/Group: Individual Therapy  [name], Norely Schlick Chappell 08/17/2014, 9:52 AM

## 2014-08-17 NOTE — Progress Notes (Signed)
Social Work Patient ID: Cyril LoosenJimmie G Croucher, male   DOB: 05/16/1961, 53 y.o.   MRN: [id number]  Continue to pursue SNF.  Difficult placement due to trach and Medicaid only payor.  Have resent FL2 and am in process of calling facilities directly to discuss.  Will keep staff posted.  Maziyah Vessel, LCSW

## 2014-08-17 NOTE — Progress Notes (Signed)
[facility] PHYSICAL MEDICINE & REHABILITATION     PROGRESS NOTE     Subjective/Complaints:  No complaints. Denies pain, sob.  A  review of systems has been performed and if not noted above is otherwise negative.   Objective: Vital Signs: Blood pressure 136/54, pulse 75, temperature 99 F (37.2 C), temperature source Oral, resp. rate 18, height 5\' 2"  (1.575 m), weight 92.3 kg (203 lb 7.8 oz), SpO2 96.00%. Dg Chest Port 1 View  08/16/2014   CLINICAL DATA:  Tracheostomy dependence, personal history coronary artery disease post CABG and cardiac arrest, CHF, hypertension, diabetes mellitus, COPD, acute on chronic renal failure, GERD  EXAM: PORTABLE CHEST - 1 VIEW  COMPARISON:  Portable exam 0633 hr compared to 08/03/2014  FINDINGS: Tracheostomy tube stable.  Enlargement of cardiac silhouette post median sternotomy.  Slight pulmonary vascular cephalization.  Mediastinal contours normal.  Chronic accentuation of RIGHT upper lobe markings little changed.  No definite acute infiltrate, pleural effusion or pneumothorax.  Bones demineralized.  IMPRESSION: Enlargement of cardiac silhouette with slight pulmonary vascular cephalization and postsurgical changes of median sternotomy.  No definite acute infiltrate.   Electronically Signed   By: Ulyses SouthwardMark  Boles M.D.   On: 08/16/2014 08:19    Recent Labs  08/16/14 0745  WBC 8.2  HGB 12.2*  HCT 36.8*  PLT 390    Recent Labs  08/16/14 0745  NA 139  K 4.2  CL 96  GLUCOSE 178*  BUN 26*  CREATININE 0.80  CALCIUM 9.5   CBG (last 3)   Recent Labs  08/16/14 2048 08/17/14 08/17/14 0405  GLUCAP 122* 182* 165*    Wt Readings from Last 3 Encounters:  08/17/14 92.3 kg (203 lb 7.8 oz)  07/26/14 93 kg (205 lb 0.4 oz)  07/26/14 93 kg (205 lb 0.4 oz)    Physical Exam:  Constitutional:  No acute distress.  HENT: orlal mucosa pink/moist  Head: Normocephalic.  Eyes: EOM are normal.  Neck:  #6 cuffed trach in place--no secretions thru trach , skin  looks ok Cardiac rate controlled. No murmurs. Reg rhythm  Respiratory:  No distress. Better breath sounds  Upper airway sounds on exam.  GI: Soft. Bowel sounds are normal. He exhibits mild distension. G-tube site clean Neurological: .  Patient is able to provide his name, age and date of birth. Follows simple commands. CN exam grossly intact. UE: 4/5delt, 4/5 bicep/tricep/HI. LE's: 3/5 HF, 3+KE, 4 ankles. No gross sensory changes. Good sitting balance.  Psychiatric:  Flat. Appears alert, accurate with head nods Skin: trach site ok   Assessment/Plan: 1. Functional deficits secondary to severe deconditioning after respiratory failure which require 3+ hours per day of interdisciplinary therapy in a comprehensive inpatient rehab setting. Physiatrist is providing close team supervision and 24 hour management of active medical problems listed below. Physiatrist and rehab team continue to assess barriers to discharge/monitor patient progress toward functional and medical goals.  Therapeutic ambulation with nurse or nurse tech only---at functional goals  FIM: FIM - Bathing Bathing Steps Patient Completed: Chest;Right Arm;Left Arm;Abdomen;Front perineal area;Buttocks;Right upper leg;Left upper leg;Right lower leg (including foot);Left lower leg (including foot) Bathing: 5: Supervision: Safety issues/verbal cues  FIM - Upper Body Dressing/Undressing Upper body dressing/undressing: 0: Wears gown/pajamas-no public clothing FIM - Lower Body Dressing/Undressing Lower body dressing/undressing steps patient completed: Don/Doff right sock;Don/Doff left sock Lower body dressing/undressing: 0: Wears gown/pajamas-no public clothing  FIM - Toileting Toileting steps completed by patient: Performs perineal hygiene Toileting Assistive Devices: Grab bar or rail  for support Toileting: 5: Supervision: Safety issues/verbal cues  FIM - Diplomatic Services operational officerToilet Transfers Toilet Transfers Assistive Devices: Bedside  commode Toilet Transfers: 5-To toilet/BSC: Supervision (verbal cues/safety issues)  FIM - BankerBed/Chair Transfer Bed/Chair Transfer Assistive Devices: Arm rests;Walker Bed/Chair Transfer: 5: Chair or W/C > Bed: Supervision (verbal cues/safety issues);5: Bed > Chair or W/C: Supervision (verbal cues/safety issues)  FIM - Locomotion: Wheelchair Distance: 150'x2 Locomotion: Wheelchair: 0: Activity did not occur FIM - Locomotion: Ambulation Locomotion: Ambulation Assistive Devices: Designer, industrial/productWalker - Rolling Ambulation/Gait Assistance: 5: Supervision Locomotion: Ambulation: 5: Travels 150 ft or more with supervision/safety issues  Comprehension Comprehension Mode: Auditory Comprehension: 5-Follows basic conversation/direction: With extra time/assistive device  Expression Expression Mode: Nonverbal Expression Assistive Devices: 6-Communication board Expression: 2-Expresses basic 25 - 49% of the time/requires cueing 50 - 75% of the time. Uses single words/gestures.  Social Interaction Social Interaction: 4-Interacts appropriately 75 - 89% of the time - Needs redirection for appropriate language or to initiate interaction.  Problem Solving Problem Solving: 5-Solves basic 90% of the time/requires cueing < 10% of the time  Memory Memory: 4-Recognizes or recalls 75 - 89% of the time/requires cueing 10 - 24% of the time  Medical Problem List and Plan:  1. Functional deficits secondary to debilitation after cardiac arrest/respiratory failure.  2. 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   -continue #6 cuffed trach for now with cuff up per CCS  -IS/FV  -pulmonary toilet  -continue augmentin    2. DVT Prophylaxis/Anticoagulation: Subcutaneous heparin. Monitor platelet counts and any signs of bleeding  3. Pain Management: Tylenol as needed  4.  Severe Oral-pharyngeal --   -G-tube placed and he's tolerating TF 5. Neuropsych: This patient is capable of making decisions on his own behalf.   -   lexapro qhs for reactive derpession 6. Skin/Wound Care: Routine skin care. Trach care as directed  7. Fluids/Electrolytes/Nutrition:  Following weights and I's and o's  8. CAD/CABG. Patient has refused cardiac catheterization in the past  9. Diastolic congestive heart failure. Continue Lasix as directed.     -weights trending back up. Doesn't appear fluid overloaded---cxr pending 10. Diabetes mellitus with peripheral neuropathy.   -reduce lantus to 60u qam and 60u qpm    -appreciate RD's help with changing formula  11. Tobacco abuse. Provide counseling   12. Chronic renal insufficiency. Baseline creatinine 1.5-2.   13. Extensive sinus disease left mastoid. Continue Augmentin for time being

## 2014-08-17 NOTE — Progress Notes (Signed)
Occupational Therapy Discharge Summary  Patient Details  Name: Richard [name] MRN: [id number] Date of Birth: 04-06-1961   Patient has met 8 of 8 long term goals due to ability to compensate for deficits.  Pt has demonstrated steady progress with BADLs this admission.  Pt required max encouragement to participate in therapies with the exception of bathing and dressing tasks.  Pt declined wearing clothes throughout this admission with the exception of 10/21.  Pt agreed to don clothing but removed them immediately after donning.  Pt is supervision for all BADLs, requiring assistance managing O2 tubing. Pt has met all LTGs and is awaiting SNF placement. Nursing has been educated on assistance level with BADLs and will continue to provide assistance PRN.Patient to discharge at overall Supervision level.  Patient's care partner unavailable to provide the necessary assistance at discharge and patient is therefore awaiting SNF placement.     Recommendation:  Patient will benefit from ongoing skilled OT services in skilled nursing facility setting to continue to advance functional skills in the area of BADL.  Equipment: No equipment provided  Reasons for discharge: treatment goals met  Patient/family agrees with progress made and goals achieved: Yes  OT Discharge   Pain Pain Assessment Pain Assessment: No/denies pain   Vision/Perception  Vision- History Baseline Vision/History: Wears glasses Wears Glasses: At all times Patient Visual Report: No change from baseline Vision- Assessment Vision Assessment?: No apparent visual deficits  Cognition Overall Cognitive Status: Within Functional Limits for tasks assessed Arousal/Alertness: Awake/alert Orientation Level: Oriented X4 Attention: Selective Sustained Attention: Appears intact Selective Attention: Appears intact Memory: Impaired Memory Impairment: Decreased recall of new information Decreased Short Term Memory: Verbal  basic;Functional basic Awareness: Impaired Awareness Impairment: Anticipatory impairment;Emergent impairment Problem Solving: Appears intact Problem Solving Impairment: Functional basic Safety/Judgment: Appears intact Sensation Sensation Light Touch: Appears Intact Stereognosis: Not tested Hot/Cold: Appears Intact Proprioception: Appears Intact Coordination Gross Motor Movements are Fluid and Coordinated: Yes Fine Motor Movements are Fluid and Coordinated: Yes Motor  Motor Motor: Within Functional Limits Mobility     Trunk/Postural Assessment  Cervical Assessment Cervical Assessment: Within Functional Limits Thoracic Assessment Thoracic Assessment: Within Functional Limits Lumbar Assessment Lumbar Assessment: Within Functional Limits Postural Control Postural Control: Within Functional Limits  Balance Dynamic Sitting Balance Dynamic Sitting - Balance Support: Feet supported Dynamic Sitting - Level of Assistance: 6: Modified independent (Device/Increase time) Extremity/Trunk Assessment RUE Assessment RUE Assessment: Within Functional Limits LUE Assessment LUE Assessment: Within Functional Limits  See FIM for current functional status  Leroy Libman 08/17/2014, 10:01 AM

## 2014-08-17 NOTE — Progress Notes (Addendum)
Physical Therapy Session Note  Patient Details  Name: Richard [name] MRN: [id number] Date of Birth: 1961-03-20  Today's Date: 08/17/2014 PT Individual Time: 1015-1030 PT Individual Time Calculation (min): 15 min   Short Term Goals: Week 3:  PT Short Term Goal 1 (Week 3): STGs=LTGs due to anticipated LOS  Skilled Therapeutic Interventions/Progress Updates:  1:1. Pt received sitting in recliner, initially shaking head "no" to participation in therapy. Pt req encouragement to utilize communication board, writing "only 1 walk in the evening." Pt educated on scheduled time of physical therapy and emphasis on participation at this time. Pt agreeable with max encouragement. Focus on functional endurance during mobility. Pt amb 175' with RW then negotiating up/down 5 steps w/ B rail without seated rest between. Pt req seated rest before amb 175' back to room. Pt req supervision overall this session with therapist managing O2 tubing. Pt able to maintain SaO2 >92% on 3L at 28% via trach collar. Pt educated on discontinuation of physical therapy services as pt as met all LTGs and he is able to perform maintanence program via ambulation for endurance with nursing staff at supervision level with use of RW. Pt nodded head "yes" to indicate understanding. Pt left sitting in recliner at end of session w/ all needs in reach.   Therapy Documentation Precautions:  Precautions Precautions: Fall Precaution Comments: h/o falls PTA per chart, trach and O2 Restrictions Weight Bearing Restrictions: No  See FIM for current functional status  Therapy/Group: Individual Therapy  Gilmore Laroche 08/17/2014, 10:37 AM

## 2014-08-18 LAB — GLUCOSE, CAPILLARY
GLUCOSE-CAPILLARY: 238 mg/dL — AB (ref 70–99)
GLUCOSE-CAPILLARY: 222 mg/dL — AB (ref 70–99)
Glucose-Capillary: 206 mg/dL — ABNORMAL HIGH (ref 70–99)
Glucose-Capillary: 224 mg/dL — ABNORMAL HIGH (ref 70–99)
Glucose-Capillary: 230 mg/dL — ABNORMAL HIGH (ref 70–99)
Glucose-Capillary: 238 mg/dL — ABNORMAL HIGH (ref 70–99)

## 2014-08-18 NOTE — Progress Notes (Signed)
Trach Consult Note:  Pt seen at this time for trach follow up. #6shiley remains in place. Dr. Delton CoombesByrum recommends to change to #4cfls. Will continue to follow pt for progress.

## 2014-08-18 NOTE — Progress Notes (Signed)
[place] PHYSICAL MEDICINE & REHABILITATION     PROGRESS NOTE     Subjective/Complaints:  No new changes.   A  review of systems has been performed and if not noted above is otherwise negative.   Objective: Vital Signs: Blood pressure 123/67, pulse 69, temperature 98.8 F (37.1 C), temperature source Oral, resp. rate 18, height 5\' 2"  (1.575 m), weight 91.853 kg (202 lb 8 oz), SpO2 98.00%. No results found.  Recent Labs  08/16/14 0745  WBC 8.2  HGB 12.2*  HCT 36.8*  PLT 390    Recent Labs  08/16/14 0745  NA 139  K 4.2  CL 96  GLUCOSE 178*  BUN 26*  CREATININE 0.80  CALCIUM 9.5   CBG (last 3)   Recent Labs  08/17/14 2342 08/18/14 0347 08/18/14 0829  GLUCAP 172* 206* 238*    Wt Readings from Last 3 Encounters:  08/18/14 91.853 kg (202 lb 8 oz)  07/26/14 93 kg (205 lb 0.4 oz)  07/26/14 93 kg (205 lb 0.4 oz)    Physical Exam:  Constitutional:  No acute distress.  HENT: orlal mucosa pink/moist  Head: Normocephalic.  Eyes: EOM are normal.  Neck:  #6 cuffed trach in place--no secretions thru trach , skin looks ok Cardiac rate controlled. No murmurs. Reg rhythm  Respiratory:  No distress. Better breath sounds  Upper airway sounds on exam.  GI: Soft. Bowel sounds are normal. He exhibits mild distension. G-tube site clean Neurological: .  Patient is able to provide his name, age and date of birth. Follows simple commands. CN exam grossly intact. UE: 4/5delt, 4/5 bicep/tricep/HI. LE's: 3/5 HF, 3+KE, 4 ankles. No gross sensory changes. Good sitting balance.  Psychiatric:  Flat. Appears alert, accurate with head nods Skin: trach site ok   Assessment/Plan: 1. Functional deficits secondary to severe deconditioning after respiratory failure which require 3+ hours per day of interdisciplinary therapy in a comprehensive inpatient rehab setting. Physiatrist is providing close team supervision and 24 hour management of active medical problems listed  below. Physiatrist and rehab team continue to assess barriers to discharge/monitor patient progress toward functional and medical goals.  Therapeutic ambulation with nurse or nurse tech only---at functional goals  FIM: FIM - Bathing Bathing Steps Patient Completed: Chest;Right Arm;Left Arm;Abdomen;Front perineal area;Buttocks;Right upper leg;Left upper leg;Right lower leg (including foot);Left lower leg (including foot) Bathing: 5: Supervision: Safety issues/verbal cues  FIM - Upper Body Dressing/Undressing Upper body dressing/undressing steps patient completed: Thread/unthread right sleeve of pullover shirt/dresss;Thread/unthread left sleeve of pullover shirt/dress;Put head through opening of pull over shirt/dress;Pull shirt over trunk Upper body dressing/undressing: 5: Supervision: Safety issues/verbal cues FIM - Lower Body Dressing/Undressing Lower body dressing/undressing steps patient completed: Thread/unthread right pants leg;Thread/unthread left pants leg;Pull pants up/down;Fasten/unfasten pants;Don/Doff right sock;Don/Doff left sock Lower body dressing/undressing: 5: Supervision: Safety issues/verbal cues  FIM - Toileting Toileting steps completed by patient: Adjust clothing prior to toileting;Performs perineal hygiene;Adjust clothing after toileting Toileting Assistive Devices: Grab bar or rail for support Toileting: 5: Supervision: Safety issues/verbal cues  FIM - Diplomatic Services operational officerToilet Transfers Toilet Transfers Assistive Devices: Bedside commode Toilet Transfers: 5-To toilet/BSC: Supervision (verbal cues/safety issues);5-From toilet/BSC: Supervision (verbal cues/safety issues)  FIM - Press photographerBed/Chair Transfer Bed/Chair Transfer Assistive Devices: Arm rests;Walker Bed/Chair Transfer: 5: Chair or W/C > Bed: Supervision (verbal cues/safety issues);5: Bed > Chair or W/C: Supervision (verbal cues/safety issues)  FIM - Locomotion: Wheelchair Distance: 150'x2 Locomotion: Wheelchair: 0: Activity did not  occur FIM - Locomotion: Ambulation Locomotion: Ambulation Assistive Devices: Designer, industrial/productWalker - Rolling  Ambulation/Gait Assistance: 5: Supervision Locomotion: Ambulation: 5: Travels 150 ft or more with supervision/safety issues  Comprehension Comprehension Mode: Auditory Comprehension: 5-Follows basic conversation/direction: With extra time/assistive device  Expression Expression Mode: Nonverbal Expression Assistive Devices: 6-Communication board Expression: 2-Expresses basic 25 - 49% of the time/requires cueing 50 - 75% of the time. Uses single words/gestures.  Social Interaction Social Interaction: 4-Interacts appropriately 75 - 89% of the time - Needs redirection for appropriate language or to initiate interaction.  Problem Solving Problem Solving: 5-Solves basic 90% of the time/requires cueing < 10% of the time  Memory Memory: 4-Recognizes or recalls 75 - 89% of the time/requires cueing 10 - 24% of the time  Medical Problem List and Plan:  1. Functional deficits secondary to debilitation after cardiac arrest/respiratory failure.  2. 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   -continue #6 cuffed trach for now with cuff up per CCS  -IS/FV  -pulmonary toilet  -continue augmentin    2. DVT Prophylaxis/Anticoagulation: Subcutaneous heparin. Monitor platelet counts and any signs of bleeding  3. Pain Management: Tylenol as needed  4.  Severe Oral-pharyngeal --   -G-tube placed and he's tolerating TF 5. Neuropsych: This patient is capable of making decisions on his own behalf.   -  lexapro qhs for reactive derpession 6. Skin/Wound Care: Routine skin care. Trach care as directed  7. Fluids/Electrolytes/Nutrition:  Following weights and I's and o's  8. CAD/CABG. Patient has refused cardiac catheterization in the past  9. Diastolic congestive heart failure. Continue Lasix as directed.     -weights trending back up. Doesn't appear fluid overloaded---cxr pending 10. Diabetes mellitus  with peripheral neuropathy.   -reduced lantus to 60u qam and 60u qpm    -on diabetic formula  11. Tobacco abuse. Provide counseling   12. Chronic renal insufficiency. Baseline creatinine 1.5-2.   13. Extensive sinus disease left mastoid. Continue Augmentin for time being

## 2014-08-18 NOTE — Progress Notes (Signed)
   Name: Richard LoosenJimmie G [name] MRN: [id number] DOB: Jul 03, 1961    ADMISSION DATE:  07/26/2014 CONSULTATION DATE:  07/28/14  REFERRING MD :  Hermelinda MedicusSchwartz   CHIEF COMPLAINT:  Trach mgmt   BRIEF PATIENT DESCRIPTION: 53 y/o male with hx dCHF, COPD, DM, CKD [facility] with AMS, hyperkalemia and developed VF/VT arrest.  Course was c/b failed extubation requiring immediate re-intubation (now s/p trach), CAD for which he refused cath, volume overload, and significant dysphagia.  [facility] for further rehab efforts.  He cont to have difficulties with dysphagia, increased secretions and low grade fevers and PCCM asked to re-eval.   STUDIES:  FEES 10/1>> severe dysphagia with silent aspiration  10/6 sputum>> neg   SUBJECTIVE:   Comfortable with #6, cuff deflated.  Has not tolerated PMV thus far Secretions are significantly decreased.   VITAL SIGNS: Temp:  [98.8 F (37.1 C)] 98.8 F (37.1 C) (10/22 0525) Pulse Rate:  [69-77] 69 (10/22 0732) Resp:  [16-18] 18 (10/22 0732) BP: (123)/(67) 123/67 mmHg (10/22 0525) SpO2:  [92 %-98 %] 98 % (10/22 0906) FiO2 (%):  [28 %] 28 % (10/22 0906) Weight:  [91.536 kg (201 lb 12.8 oz)-91.853 kg (202 lb 8 oz)] 91.853 kg (202 lb 8 oz) (10/22 0500)  PHYSICAL EXAMINATION: General:  Chronically ill appearing male, NAD. Sitting in chair.  Neuro:  Alert and interactive, MAE, tries to communicate. HEENT:  [place]/AT, PERRL, MMM.  Trach site C/D/I, no obvious secretions noted. Cardiovascular:  RRR, no M/R/G. Lungs:  Diminished bilaterally.  Coarse rhonchi scattered. Abdomen:  Soft, NT, ND and +BS. Musculoskeletal: 1+ pitting edema.  No gross deformities. Skin:  Intact.   CBC  Recent Labs Lab 08/16/14 0745  HGB 12.2*  HCT 36.8*  WBC 8.2  PLT 390   CHEMISTRY  Recent Labs Lab 08/16/14 0745  NA 139  K 4.2  CL 96  CO2 29  GLUCOSE 178*  BUN 26*  CREATININE 0.80  CALCIUM 9.5    No results  found.  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  Failed extubation 9/23 with stridor requiring immediate reintubation. Now tol ATC at all times, #6 cuffed shiley.   RECS -  Would downsize him now to a cuffless #4. I believe this will progress him more quickly, understanding there is some slight risk involved if he continues to have occult aspiration.  Once trach changed would consider repeat FEES to assess for PO diet. Also replacement of PMV to see if better tolerated D/c Augmentin, he has been treated for 3 weeks Continue scopolamine patch for now CXR intermittently.  Chronic diastolic CHF  Volume Overload   REC -  Continue lasix 20 QD.    Levy Pupaobert Byrum, MD, PhD 08/18/2014, 10:00 AM [facility] [phone] or if no answer [phone]

## 2014-08-18 NOTE — Progress Notes (Signed)
Attempted to downsize trach to a 4 cuffless, unable to downsize because trach is stuck and unable to remove. RN aware and will notify MD.

## 2014-08-18 NOTE — Progress Notes (Addendum)
Physical Therapy Discharge Summary  Patient Details  Name: Richard [name] MRN: [id number] Date of Birth: [birth year]  Pt has made functional gains regarding transfer and mobility during his LOS, despite req max encouragement for participation at times. Patient has met 6 of 6 long term goals due to improved activity tolerance, improved balance, increased strength and improved awareness.  Patient to discharge at an ambulatory level of Supervision with use of RW, req cues for safety as well as assistance from staff to manage lines. Patient's family is unable to provide the necessary physical and cognitive assistance at discharge, awaiting discharge to SNF. Pt discharged from Physical Therapy at this time as pt as met all LTGs. Pt is able to perform maintanence program via ambulation for functional endurance with nursing staff at supervision level with use of RW.   Reasons goals not met: N/A  Recommendation:  Patient will benefit from ongoing skilled PT services in skilled nursing facility setting to continue to advance safe functional mobility, address ongoing impairments in decreased functional endurance, decreased balance, emergent awareness, safety awareness, and minimize fall risk.  Equipment: No equipment provided  Reasons for discharge: treatment goals met  Patient/family agrees with progress made and goals achieved: Yes  PT Discharge Precautions/Restrictions Precautions Precautions: Fall Precaution Comments: h/o falls PTA per chart, trach and O2 Restrictions Weight Bearing Restrictions: No Vital Signs Therapy Vitals Temp: 98.8 F (37.1 C) Temp Source: Oral Pulse Rate: 69 Resp: 18 BP: 123/67 mmHg Patient Position (if appropriate): Sitting Oxygen Therapy SpO2: 98 % O2 Device: Trach collar O2 Flow Rate (L/min): 5 L/min FiO2 (%): 28 % Pain    Vision/Perception   See OT Discharge Cognition Overall Cognitive Status: Impaired/Different from baseline Arousal/Alertness:  Awake/alert Orientation Level: Oriented X4 Attention: Selective Sustained Attention: Appears intact Selective Attention: Appears intact Memory: Impaired Memory Impairment: Decreased recall of new information Decreased Short Term Memory: Verbal basic;Functional basic Awareness: Impaired Awareness Impairment: Emergent impairment Problem Solving: Impaired Problem Solving Impairment: Functional basic Behaviors: Poor frustration tolerance Safety/Judgment: Appears intact Comments: Patient utilizes call bell to request assistance and uses gesutres or written aids for communication  Sensation Sensation Light Touch: Appears Intact Proprioception: Appears Intact Coordination Gross Motor Movements are Fluid and Coordinated: Yes Fine Motor Movements are Fluid and Coordinated: Yes Motor  Motor Motor: Within Functional Limits  Mobility Bed Mobility Bed Mobility:  (Not assessed as pt sleeps in recliner while in hospital and at baseline) Transfers Transfers: Yes Sit to Stand: 5: Supervision Sit to Stand Details: Verbal cues for precautions/safety;Verbal cues for safe use of DME/AE Stand to Sit: 5: Supervision Stand to Sit Details (indicate cue type and reason): Verbal cues for precautions/safety Stand Pivot Transfers: 5: Supervision Stand Pivot Transfer Details: Verbal cues for precautions/safety Stand Pivot Transfer Details (indicate cue type and reason): Cues for safety as therapist required to manage lines Locomotion  Ambulation Ambulation: Yes Ambulation/Gait Assistance: 5: Supervision Ambulation Distance (Feet): 175 Feet Assistive device: Rolling walker Ambulation/Gait Assistance Details: Verbal cues for safe use of DME/AE Ambulation/Gait Assistance Details: Cues for posture and proximity to RW, pt can be mildly impulsive due to poor frustration tolerance and wanting to complete standing tasks quickly in order to sit back down Gait Gait Pattern: Impaired Gait Pattern: Step-through  pattern;Trunk flexed;Wide base of support Stairs / Additional Locomotion Stairs: Yes Stairs Assistance: 5: Supervision Stairs Assistance Details: Verbal cues for precautions/safety Stairs Assistance Details (indicate cue type and reason): cues for decreased speed as therapist req to manage lines Stair Management Technique:  Two rails;Alternating pattern;Forwards;One rail Right Number of Stairs: 5 Wheelchair Mobility Wheelchair Mobility: No (Pt at ambualtory level)  Trunk/Postural Assessment  Cervical Assessment Cervical Assessment: Exceptions to [facility] (limited due to trach) Thoracic Assessment Thoracic Assessment: Exceptions to [facility] (rigid and stiff) Lumbar Assessment Lumbar Assessment: Exceptions to [facility] (rigid and stiff) Postural Control Righting Reactions: limited in standing without use of RW  Balance Dynamic Sitting Balance Dynamic Sitting - Balance Support: Feet supported Dynamic Sitting - Level of Assistance: 6: Modified independent (Device/Increase time) Static Standing Balance Static Standing - Balance Support: During functional activity;Right upper extremity supported;Left upper extremity supported;No upper extremity supported Static Standing - Level of Assistance: 5: Stand by assistance Dynamic Standing Balance Dynamic Standing - Balance Support: During functional activity;Right upper extremity supported;Left upper extremity supported;Bilateral upper extremity supported Dynamic Standing - Level of Assistance: 5: Stand by assistance Dynamic Standing - Balance Activities: Lateral lean/weight shifting;Forward lean/weight shifting;Reaching for objects;Reaching for weighted objects;Reaching across midline Extremity Assessment  RUE Assessment RUE Assessment: Within Functional Limits LUE Assessment LUE Assessment: Within Functional Limits RLE Assessment RLE Assessment: Within Functional Limits (weak hip flex, not full range due to body habitus) LLE Assessment LLE Assessment:  Within Functional Limits (weak hip flex, not full range due to body habitus)  See FIM for current functional status  Otis Brace S 08/18/2014, 9:01 AM

## 2014-08-18 NOTE — Progress Notes (Signed)
Notified Dr. Delton CoombesByrum with result of RTT unable to remove trach. No new orders at this time. Byrum, MD will evaluate and reassess. Cont. To monitor pt

## 2014-08-18 NOTE — Progress Notes (Signed)
Social Work Patient ID: [name], male   DOB: 04/09/1961, 53 y.o.   MRN: [id number]  Have been able to secure a SN[facility] bed [facility] and this facility can admit pt on Monday 10/26.  Have alerted pt and family of plans to transfer.  Will alert tx team and MD.  Amada JupiterHOYLE, Corin Formisano, LCSW

## 2014-08-19 LAB — GLUCOSE, CAPILLARY
GLUCOSE-CAPILLARY: 209 mg/dL — AB (ref 70–99)
Glucose-Capillary: 186 mg/dL — ABNORMAL HIGH (ref 70–99)
Glucose-Capillary: 215 mg/dL — ABNORMAL HIGH (ref 70–99)
Glucose-Capillary: 218 mg/dL — ABNORMAL HIGH (ref 70–99)
Glucose-Capillary: 209 mg/dL — ABNORMAL HIGH (ref 70–99)

## 2014-08-19 MED ORDER — INSULIN GLARGINE 100 UNIT/ML ~~LOC~~ SOLN
65.0000 [IU] | Freq: Every day | SUBCUTANEOUS | Status: DC
  Administered 2014-08-19 – 2014-08-22 (×4): 65 [IU] via SUBCUTANEOUS
  Filled 2014-08-19 (×5): qty 0.65

## 2014-08-19 MED ORDER — INSULIN GLARGINE 100 UNIT/ML ~~LOC~~ SOLN
65.0000 [IU] | Freq: Every day | SUBCUTANEOUS | Status: DC
  Administered 2014-08-20 – 2014-08-22 (×3): 65 [IU] via SUBCUTANEOUS
  Filled 2014-08-19 (×4): qty 0.65

## 2014-08-19 NOTE — Progress Notes (Signed)
NUTRITION FOLLOW-UP  INTERVENTION: Continue Glucerna 1.2 Cal formula per PEG tube at goal rate of 85 ml/hr for 20 hours (in anticipation of therapy) with 30 ml Prostat per tube once daily.  Continue 200 ml free water every 6 hours.  Tube feeding regimen provides 2140 kcal, 117 grams of protein, and 1428 ml of H2O. Total free water: 2228 ml  Will continue to monitor.  NUTRITION DIAGNOSIS: Inadequate oral intake related to inability to eat as evidenced by NPO status; ongoing  Goal: Pt to meet >/= 90% of their estimated nutrition needs; met  Monitor:  TF tolerance, weight trends, labs  53 y.o. male  Admitting Dx: Weakness  ASSESSMENT: 10/1-Pt admitted from [facility] for hyperkalemia and AMS. Pt has cardiac arrest while undergoing echocardiogram. Pt intubated and had difficulty with extubation. Pt had trach placed 9/24.  Pt failed swallow evaluation 10/1 and now plans to have feeding tube placed and to start enteral nutrition. No signs of fat or muscle depletion.   10/7- Panda tube came out ("coughed out") last night.  Panda tube was not replaced as pt is agreeable to PEG tube placement. TF has been stopped/discontinued for now. Prior to tube coming out RN reports pt has been tolerating his feedings well. Pt with continue NPO status. IR PA to see pt to discuss PEG tube placement procedure. Recommend continuing TF regimen once PEG tube is placed and stable for use.  10/9- PEG tube placed 10/8. RD consulted for TF management and initiation. To start TF once PEG tube if ready and stable for use per IR. Per RN, TF was started at 1100. Discussed with RN about TF regimen.  10/13- Spoke with RN, pt has been tolerating his TF well. Pt with very little to no residuals. Spoke with pt, he reports no abdominal pains or any other problems.  10/20- RD consulted for TF formula change to assist with glycemic control. CBG's 178-257 mg/dL. Pt has been tolerating his tube feeding well. TF has been  stopped this AM in anticipation for TF formula change. Will change TF formula to Glucerna 1.2 Cal, which has a unique carbohydrate blend to help minimize blood glucose responses. Will monitor its effectiveness. Spoke with RN about TF recommendation changes.  10/13- Spoke with RN, pt has been tolerating his TF well. Pt with little to no residuals. Weight has been stable. CBG's 178-215 mg/dL. Pt's blood sugars have been improving a bit.   Height: Ht Readings from Last 1 Encounters:  07/26/14 $RemoveB'5\' 2"'PzwTjdAp$  (1.575 m)    Weight: Wt Readings from Last 1 Encounters:  08/19/14 201 lb 8 oz (91.4 kg)    BMI:  Body mass index is 36.85 kg/(m^2).  Re-Estimated Nutritional Needs: Kcal: 2000-2200 Protein: 110-125 grams Fluid: > 2 L/day  Skin: +1 generalized edema, incision on right leg  Diet Order: NPO   Intake/Output Summary (Last 24 hours) at 08/19/14 1152 Last data filed at 08/18/14 1841  Gross per 24 hour  Intake    400 ml  Output      0 ml  Net    400 ml    Last BM: 10/22  Labs:   Recent Labs Lab 08/16/14 0745  NA 139  K 4.2  CL 96  CO2 29  BUN 26*  CREATININE 0.80  CALCIUM 9.5  GLUCOSE 178*    CBG (last 3)   Recent Labs  08/19/14 0416 08/19/14 0758 08/19/14 1122  GLUCAP 186* 215* 209*    Scheduled Meds: . antiseptic oral rinse  7 mL Mouth Rinse QID  . atorvastatin  80 mg Per Tube q1800  . chlorhexidine  15 mL Mouth Rinse BID  . escitalopram  10 mg Per Tube QHS  . feeding supplement (PRO-STAT SUGAR FREE 64)  30 mL Per Tube Q1500  . free water  200 mL Per Tube 4 times per day  . furosemide  20 mg Per Tube Daily  . heparin  5,000 Units Subcutaneous 3 times per day  . insulin aspart  0-20 Units Subcutaneous 6 times per day  . insulin glargine  65 Units Subcutaneous QHS  . [START ON 08/20/2014] insulin glargine  65 Units Subcutaneous Daily  . pantoprazole sodium  40 mg Per Tube Daily  . scopolamine  1 patch Transdermal Q72H    Continuous Infusions: . feeding  supplement (GLUCERNA 1.2 CAL) 1,000 mL (08/18/14 1204)    Past Medical History  Diagnosis Date  . CAD (coronary artery disease) of artery bypass graft 07/11/2014  . Cardiac arrest 07/11/2014  . CHF (congestive heart failure) 07/11/2014  . HTN (hypertension) 07/11/2014  . COPD (chronic obstructive pulmonary disease) 07/11/2014  . DM (diabetes mellitus) 07/11/2014  . GERD (gastroesophageal reflux disease) 07/11/2014  . Acute on chronic kidney failure 07/11/2014    History reviewed. No pertinent past surgical history.  Kallie Locks, MS, RD, LDN Pager # [phone] After hours/ weekend pager # [phone]

## 2014-08-19 NOTE — Progress Notes (Signed)
[facility] PH[facility] MEDICINE & REHABILITATION     PROGRESS NOTE     Subjective/Complaints:  No new issues. Engages little   A  review of systems has been performed and if not noted above is otherwise negative.   Objective: Vital Signs: Blood pressure 120/56, pulse 73, temperature 98.8 F (37.1 C), temperature source Oral, resp. rate 18, height 5\' 2"  (1.575 m), weight 91.4 kg (201 lb 8 oz), SpO2 96.00%. No results found. No results found for this basename: WBC, HGB, HCT, PLT,  in the last 72 hours No results found for this basename: NA, K, CL, CO, GLUCOSE, BUN, CREATININE, CALCIUM,  in the last 72 hours CBG (last 3)   Recent Labs  08/18/14 2344 08/19/14 0416 08/19/14 0758  GLUCAP 230* 186* 215*    Wt Readings from Last 3 Encounters:  08/19/14 91.4 kg (201 lb 8 oz)  07/26/14 93 kg (205 lb 0.4 oz)  07/26/14 93 kg (205 lb 0.4 oz)    Physical Exam:  Constitutional:  No acute distress.  HENT: orlal mucosa pink/moist  Head: Normocephalic.  Eyes: EOM are normal.  Neck:  #6 cuffed trach in place-- Cardiac rate controlled. No murmurs. Reg rhythm  Respiratory:  No distress. Better breath sounds  Upper airway sounds on exam.  GI: Soft. Bowel sounds are normal. He exhibits mild distension. G-tube site clean Neurological: .  Patient is able to provide his name, age and date of birth. Follows simple commands. CN exam grossly intact. UE: 4/5delt, 4/5 bicep/tricep/HI. LE's: 3/5 HF, 3+KE, 4 ankles. No gross sensory changes. Good sitting balance.  Psychiatric:  Flat. Appears alert, accurate with head nods Skin: trach site ok   Assessment/Plan: 1. Functional deficits secondary to severe deconditioning after respiratory failure which require 3+ hours per day of interdisciplinary therapy in a comprehensive inpatient rehab setting. Physiatrist is providing close team supervision and 24 hour management of active medical problems listed below. Physiatrist and rehab team continue to  assess barriers to discharge/monitor patient progress toward functional and medical goals.  Therapeutic ambulation with nurse or nurse tech only---at functional goals  FIM: FIM - Bathing Bathing Steps Patient Completed: Chest;Right Arm;Left Arm;Abdomen;Front perineal area;Buttocks;Right upper leg;Left upper leg;Right lower leg (including foot);Left lower leg (including foot) Bathing: 5: Supervision: Safety issues/verbal cues  FIM - Upper Body Dressing/Undressing Upper body dressing/undressing steps patient completed: Thread/unthread right sleeve of pullover shirt/dresss;Thread/unthread left sleeve of pullover shirt/dress;Put head through opening of pull over shirt/dress;Pull shirt over trunk Upper body dressing/undressing: 5: Supervision: Safety issues/verbal cues FIM - Lower Body Dressing/Undressing Lower body dressing/undressing steps patient completed: Thread/unthread right pants leg;Thread/unthread left pants leg;Pull pants up/down;Fasten/unfasten pants;Don/Doff right sock;Don/Doff left sock Lower body dressing/undressing: 5: Supervision: Safety issues/verbal cues  FIM - Toileting Toileting steps completed by patient: Adjust clothing prior to toileting;Performs perineal hygiene;Adjust clothing after toileting Toileting Assistive Devices: Grab bar or rail for support Toileting: 5: Supervision: Safety issues/verbal cues  FIM - Diplomatic Services operational officerToilet Transfers Toilet Transfers Assistive Devices: Bedside commode Toilet Transfers: 5-To toilet/BSC: Supervision (verbal cues/safety issues);5-From toilet/BSC: Supervision (verbal cues/safety issues)  FIM - Press photographerBed/Chair Transfer Bed/Chair Transfer Assistive Devices: Arm rests;Walker Bed/Chair Transfer: 5: Chair or W/C > Bed: Supervision (verbal cues/safety issues);5: Bed > Chair or W/C: Supervision (verbal cues/safety issues)  FIM - Locomotion: Wheelchair Distance: 150'x2 Locomotion: Wheelchair: 0: Activity did not occur FIM - Locomotion:  Ambulation Locomotion: Ambulation Assistive Devices: Designer, industrial/productWalker - Rolling Ambulation/Gait Assistance: 5: Supervision Locomotion: Ambulation: 5: Travels 150 ft or more with supervision/safety issues  Comprehension Comprehension  Mode: Auditory Comprehension: 5-Follows basic conversation/direction: With extra time/assistive device  Expression Expression Mode: Nonverbal Expression Assistive Devices: 6-Communication board Expression: 2-Expresses basic 25 - 49% of the time/requires cueing 50 - 75% of the time. Uses single words/gestures.  Social Interaction Social Interaction: 4-Interacts appropriately 75 - 89% of the time - Needs redirection for appropriate language or to initiate interaction.  Problem Solving Problem Solving: 5-Solves basic 90% of the time/requires cueing < 10% of the time  Memory Memory: 4-Recognizes or recalls 75 - 89% of the time/requires cueing 10 - 24% of the time  Medical Problem List and Plan:  1. Functional deficits secondary to debilitation after cardiac arrest/respiratory failure.  2. 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   -continue #6 cuffed trach for now with cuff up per CCS  -IS/FV  -pulmonary toilet  -continue augmentin    2. DVT Prophylaxis/Anticoagulation: Subcutaneous heparin. Monitor platelet counts and any signs of bleeding  3. Pain Management: Tylenol as needed  4.  Severe Oral-pharyngeal --   -G-tube placed and he's tolerating TF 5. Neuropsych: This patient is capable of making decisions on his own behalf.   -  lexapro qhs for reactive derpession 6. Skin/Wound Care: Routine skin care. Trach care as directed  7. Fluids/Electrolytes/Nutrition:  Following weights and I's and o's  8. CAD/CABG. Patient has refused cardiac catheterization in the past  9. Diastolic congestive heart failure. Continue Lasix as directed.     -weights trending back up. Doesn't appear fluid overloaded---cxr pending 10. Diabetes mellitus with peripheral  neuropathy.   -increase lantus to 65u qam and 65u qpm    -on diabetic formula  11. Tobacco abuse. Provide counseling   12. Chronic renal insufficiency. Baseline creatinine 1.5-2.   13. Extensive sinus disease left mastoid. Continue Augmentin for time being

## 2014-08-20 LAB — GLUCOSE, CAPILLARY
GLUCOSE-CAPILLARY: 178 mg/dL — AB (ref 70–99)
GLUCOSE-CAPILLARY: 231 mg/dL — AB (ref 70–99)
GLUCOSE-CAPILLARY: 215 mg/dL — AB (ref 70–99)
Glucose-Capillary: 174 mg/dL — ABNORMAL HIGH (ref 70–99)
Glucose-Capillary: 206 mg/dL — ABNORMAL HIGH (ref 70–99)
Glucose-Capillary: 219 mg/dL — ABNORMAL HIGH (ref 70–99)

## 2014-08-20 NOTE — Procedures (Signed)
Tracheostomy Change Note  Patient Details:   Name: Richard [name] DOB: May 09, 1961 MRN: [id number]    Airway Documentation:     Evaluation  O2 sats: stable throughout Complications: No apparent complications Patient did tolerate procedure well. Bilateral Breath Sounds: Clear;Diminished  Suctioning:  (Pt refuses suctioning at this time)   Pts trach changed to #4cuffless per MD order.  Pt placed back in bed for trach change.Marland Kitchen.Marland Kitchen.Some blood noted after change. + CO2 detection, BBS heard on auscultation.  HR 88, Sats 96% on 28% ATC after trach change. RN in pts room.  Spare trach at bedside and obturator at bedside.  RT will continue to monitor.   Closson, Terie PurserMorgan Holden Maniscalco 08/20/2014, 9:42 AM

## 2014-08-20 NOTE — Progress Notes (Signed)
[facility] PHYSICAL MEDICINE & REHABILITATION     PROGRESS NOTE     Subjective/Complaints:  No new issues. Engages little   A  review of systems has been performed and if not noted above is otherwise negative.   Objective: Vital Signs: Blood pressure 115/56, pulse 68, temperature 98.4 F (36.9 C), temperature source Oral, resp. rate 16, height 5\' 2"  (1.575 m), weight 91.4 kg (201 lb 8 oz), SpO2 98.00%. No results found. No results found for this basename: WBC, HGB, HCT, PLT,  in the last 72 hours No results found for this basename: NA, K, CL, CO, GLUCOSE, BUN, CREATININE, CALCIUM,  in the last 72 hours CBG (last 3)   Recent Labs  08/20/14 0004 08/20/14 0418 08/20/14 0820  GLUCAP 219* 206* 174*    Wt Readings from Last 3 Encounters:  08/19/14 91.4 kg (201 lb 8 oz)  07/26/14 93 kg (205 lb 0.4 oz)  07/26/14 93 kg (205 lb 0.4 oz)    Physical Exam:  Constitutional:  No acute distress.  HENT: orlal mucosa pink/moist  Head: Normocephalic.  Eyes: EOM are normal.  Neck:  #6 cuffed trach in place-- Cardiac rate controlled. No murmurs. Reg rhythm  Respiratory:  No distress. Better breath sounds  Upper airway sounds on exam.  GI: Soft. Bowel sounds are normal. He exhibits mild distension. G-tube site clean Neurological: .  Patient is able to provide his name, age and date of birth. Follows simple commands. CN exam grossly intact. UE: 4/5delt, 4/5 bicep/tricep/HI. LE's: 3/5 HF, 3+KE, 4 ankles. No gross sensory changes. Good sitting balance.  Psychiatric:  Flat. Appears alert, accurate with head nods Skin: trach site ok   Assessment/Plan: 1. Functional deficits secondary to severe deconditioning after respiratory failure which require 3+ hours per day of interdisciplinary therapy in a comprehensive inpatient rehab setting. Physiatrist is providing close team supervision and 24 hour management of active medical problems listed below. Physiatrist and rehab team continue to  assess barriers to discharge/monitor patient progress toward functional and medical goals.  Therapeutic ambulation with nurse or nurse tech only---at functional goals  FIM: FIM - Bathing Bathing Steps Patient Completed: Chest;Right Arm;Left Arm;Abdomen;Front perineal area;Buttocks;Right upper leg;Left upper leg;Right lower leg (including foot);Left lower leg (including foot) Bathing: 5: Supervision: Safety issues/verbal cues  FIM - Upper Body Dressing/Undressing Upper body dressing/undressing steps patient completed: Thread/unthread right sleeve of pullover shirt/dresss;Thread/unthread left sleeve of pullover shirt/dress;Put head through opening of pull over shirt/dress;Pull shirt over trunk Upper body dressing/undressing: 5: Supervision: Safety issues/verbal cues FIM - Lower Body Dressing/Undressing Lower body dressing/undressing steps patient completed: Thread/unthread right pants leg;Thread/unthread left pants leg;Pull pants up/down;Fasten/unfasten pants;Don/Doff right sock;Don/Doff left sock Lower body dressing/undressing: 5: Supervision: Safety issues/verbal cues  FIM - Toileting Toileting steps completed by patient: Adjust clothing prior to toileting;Performs perineal hygiene;Adjust clothing after toileting Toileting Assistive Devices: Grab bar or rail for support Toileting: 5: Supervision: Safety issues/verbal cues  FIM - Diplomatic Services operational officerToilet Transfers Toilet Transfers Assistive Devices: Bedside commode Toilet Transfers: 5-To toilet/BSC: Supervision (verbal cues/safety issues);5-From toilet/BSC: Supervision (verbal cues/safety issues)  FIM - Press photographerBed/Chair Transfer Bed/Chair Transfer Assistive Devices: Arm rests;Walker Bed/Chair Transfer: 5: Chair or W/C > Bed: Supervision (verbal cues/safety issues);5: Bed > Chair or W/C: Supervision (verbal cues/safety issues)  FIM - Locomotion: Wheelchair Distance: 150'x2 Locomotion: Wheelchair: 0: Activity did not occur FIM - Locomotion:  Ambulation Locomotion: Ambulation Assistive Devices: Designer, industrial/productWalker - Rolling Ambulation/Gait Assistance: 5: Supervision Locomotion: Ambulation: 5: Travels 150 ft or more with supervision/safety issues  Comprehension Comprehension  Mode: Auditory Comprehension: 5-Follows basic conversation/direction: With extra time/assistive device  Expression Expression Mode: Nonverbal Expression Assistive Devices: 6-Communication board Expression: 2-Expresses basic 25 - 49% of the time/requires cueing 50 - 75% of the time. Uses single words/gestures.  Social Interaction Social Interaction: 4-Interacts appropriately 75 - 89% of the time - Needs redirection for appropriate language or to initiate interaction.  Problem Solving Problem Solving: 5-Solves basic 90% of the time/requires cueing < 10% of the time  Memory Memory: 4-Recognizes or recalls 75 - 89% of the time/requires cueing 10 - 24% of the time  Medical Problem List and Plan:  1. Functional deficits secondary to debilitation after cardiac arrest/respiratory failure.  2. 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   -continue #6 cuffed trach for now with cuff up per CCS  -IS/FV  -pulmonary toilet     2. DVT Prophylaxis/Anticoagulation: Subcutaneous heparin. Monitor platelet counts and any signs of bleeding  3. Pain Management: Tylenol as needed  4.  Severe Oral-pharyngeal --   -G-tube placed and he's tolerating TF 5. Neuropsych: This patient is capable of making decisions on his own behalf.   -  lexapro qhs for reactive derpession 6. Skin/Wound Care: Routine skin care. Trach care as directed  7. Fluids/Electrolytes/Nutrition:  Following weights and I's and o's  8. CAD/CABG. Patient has refused cardiac catheterization in the past  9. Diastolic congestive heart failure. Continue Lasix as directed.     -weights trending back up. Doesn't appear fluid overloaded---cxr pending 10. Diabetes mellitus with peripheral neuropathy.   -increased  lantus to 65u qam and 65u qpm---may need further titration  -on diabetic formula  11. Tobacco abuse. Provide counseling   12. Chronic renal insufficiency. Baseline creatinine 1.5-2.   13. Extensive sinus disease left mastoid. Continue Augmentin for time being

## 2014-08-21 LAB — GLUCOSE, CAPILLARY
GLUCOSE-CAPILLARY: 169 mg/dL — AB (ref 70–99)
Glucose-Capillary: 123 mg/dL — ABNORMAL HIGH (ref 70–99)
Glucose-Capillary: 216 mg/dL — ABNORMAL HIGH (ref 70–99)
Glucose-Capillary: 228 mg/dL — ABNORMAL HIGH (ref 70–99)
Glucose-Capillary: 230 mg/dL — ABNORMAL HIGH (ref 70–99)
Glucose-Capillary: 232 mg/dL — ABNORMAL HIGH (ref 70–99)
Glucose-Capillary: 233 mg/dL — ABNORMAL HIGH (ref 70–99)

## 2014-08-21 NOTE — Discharge Summary (Cosign Needed Addendum)
[name]:  [name], [name]              ACCOUNT NO.:  [id number]  MEDICAL RECORD NO.:  [id number]  LOCATION:  [id number]                        FACILITY:  [facility]  PHYSICIAN:  Mariam Dollaraniel Angiulli, P.A.  DATE OF BIRTH:  [birth year]  DATE OF ADMISSION:  07/26/2014 DATE OF DISCHARGE:  08/23/2014                              DISCHARGE SUMMARY   DISCHARGE DIAGNOSES: 1. Functional deficits secondary to debilitation after cardiac arrest,     respiratory failure. 2. Status post tracheostomy July 21, 2014, downsized to #4     cuffless August 20, 2014. 3. Subcutaneous heparin for DVT prophylaxis. 4. Severe oropharyngeal dysphagia status post gastrostomy tube. 5. Coronary artery disease with CABG. 6. Depression. 7. Diastolic congestive heart failure. 8. Diabetes mellitus, peripheral neuropathy. 9. Tobacco abuse. 10.Chronic renal insufficiency with baseline creatinine 1.5-2.0.  HISTORY OF PRESENT ILLNESS:  This is a 53 year old right-handed male multi-medical, with history of coronary artery disease, bypass grafting, COPD as well as diabetes mellitus, peripheral neuropathy. Independent with a cane due to back pain prior to admission with complex social situation, and has children who are mentally disabled being taking care of other individuals at this time.  [facility], 2014, with generalized weakness, findings of hyperkalemia, altered mental status as well as white blood cell count of 14,000, blood pressure 199/114.  He was treated with Kayexalate for hyperkalemia.  While undergoing echocardiogram, suddenly developed ventricular fibrillation with a V-tach arrest.  CT angiogram of the chest negative for pulmonary emboli, he required intubation for respiratory failure as well as placement of percutaneous tracheostomy July 21, 2014, per Critical Care Medicine.  He refused cardiac catheterization to evaluate his coronary artery disease.  Plan  was to downsize tracheostomy tube soon.  Subcutaneous heparin for DVT prophylaxis.  Maintained on Augmentin for 10 days for extensive sinus disease.  Dysphagia 2 nectar thick liquid diet.  The patient was admitted for comprehensive rehab program.  PAST MEDICAL HISTORY:  See discharge diagnoses.  SOCIAL HISTORY:  Lives with a disabled son.  FUNCTIONAL HISTORY:  Prior to admission independent with a cane due to back pain.  Functional status upon admission to rehab services was +2 physical assist stand pivot transfers, plus 2 physical assist sit to stand, minimal assist ambulate 15 feet with a rolling walker, min to mod assist activities of daily living.  PHYSICAL EXAMINATION:  VITAL SIGNS:  Blood pressure 135/66, pulse 105, temperature 98.3, respirations 16. GENERAL:  This was an alert male.  He was able to provide his name, age, date of birth, followed simple commands.  He appeared older than stated age. NECK:  A #4 cuffed trach in place. CARDIAC:  Rate controlled. LUNGS:  Scattered rhonchi at the bases. ABDOMEN:  Soft, nontender.  Good bowel sounds.  REHABILITATION HOSPITAL COURSE:  The patient was admitted to Inpatient Rehab Services with therapies initiated on a 3-hour daily basis consisting of physical therapy, occupational therapy, speech therapy, and rehabilitation nursing.  The following issues were addressed during the patient's rehabilitation stay.  Pertaining to Mr. [name] debilitation secondary to respiratory cardiac arrest remained stable. Followup per Critical Care Medicine as well as Cardiology Services.  He refused cardiac catheterization  to establish any coronary artery disease.  His tracheostomy tube had been downsized to a #4 cuffless on August 20, 2014.  Subcutaneous heparin for DVT prophylaxis.  The patient was made n.p.o. due to ongoing bouts of swallowing difficulties followed by speech therapy, suspect aspiration.  Thus, a gastrostomy tube was placed for  the patient's nutritional support. A swallow study was completed day of discharge with recommendations of mechanical soft thin liquid diet. Medications crushed and given in pure. We'll continue tube feeds 7 PM to 7 AM if consumes greater than 50% of by mouth meal. Passy-Muir valve being used during the day. Close monitoring of any fluid overload, he remained on Lasix 20 mg daily.  Blood sugars monitored with history of diabetes mellitus, peripheral neuropathy. Insulin adjusted accordingly.  Monitor closely while on tube feeds. Lexapro had been initiated for depression with emotional support provided.  He did have a long history of tobacco abuse, he received full counseling in regard to cessation of any nicotine products.  It was questionable if he would be compliant with these request.  Chronic renal insufficiency with baseline creatinine 1.5-2.0.  Latest creatinine 0.80 and monitored.  The patient received weekly collaborative interdisciplinary team conferences to discuss estimated length of stay, family teaching, any barriers to his discharge.  The patient made functional gains regarding transfers, mobility despite maximum encouragement at times to participate.  He was ambulating supervision level with a rolling walker.  Required cues for safety as well as assistance for staff to manage his lines.  Occupational therapy demonstrated steady progress with basic activities of daily living, required max encouragement to participate in therapies with the exception of bathing and dressing tasks.  Due to the patient's limited resources and support at home, it was felt skilled nursing facility was needed with bed becoming available August 22, 2014.  DISCHARGE MEDICATIONS: 1. Lipitor 80 mg by mouth daily. 2. Lexapro 10 mg at by mouth bedtime . 3. Free water 200 mL every by mouth 6 hours . 4. Lasix 20 mg by mouth daily . 5. Lantus insulin 40 units subcutaneously b.i.d. 6. Protonix 40 mg by mouth  daily . 7. Scopolamine patch 1.5 mg change every 72 hours.  DIET:  Dysphagia 3 thin liquid diet. Meds crushed in pure. Continue tube feeds 7 PM to 7 AM daily at 85 mL an hour hold if consumes greater than 50% of by mouth meal. Passy-Muir valve to be used during the day only and uncapped at night  FOLLOWUP PLAN:  The patient would follow up with Dr. Faith RogueZachary Charnele Semple at the outpatient rehab service office as needed; Dr. Molli KnockYacoub, Critical Care Pulmonary Services; Dr. Westley FootsMichael Hilty, Cardiology Services.     Mariam Dollaraniel Angiulli, P.A.     DA/MEDQ  D:  08/21/2014  T:  08/21/2014  Job:  [id number]  cc:   Dr. Molli KnockYacoub Dr. Zoila ShutterKenneth Hilty Simone CuriaKeung Lee, MD

## 2014-08-21 NOTE — Discharge Summary (Signed)
Discharge summary job # [id number]

## 2014-08-21 NOTE — Progress Notes (Signed)
[name] PHYSICAL MEDICINE & REHABILITATION     PROGRESS NOTE     Subjective/Complaints:  Seems to be doing well. Cough better. Voice better.   A  review of systems has been performed and if not noted above is otherwise negative.   Objective: Vital Signs: Blood pressure 138/61, pulse 72, temperature 98.6 F (37 C), temperature source Oral, resp. rate 18, height 5\' 2"  (1.575 m), weight 91.4 kg (201 lb 8 oz), SpO2 95.00%. No results found. No results found for this basename: WBC, HGB, HCT, PLT,  in the last 72 hours No results found for this basename: NA, K, CL, CO, GLUCOSE, BUN, CREATININE, CALCIUM,  in the last 72 hours CBG (last 3)   Recent Labs  08/21/14 0001 08/21/14 0402 08/21/14 0744  GLUCAP 230* 233* 216*    Wt Readings from Last 3 Encounters:  08/19/14 91.4 kg (201 lb 8 oz)  07/26/14 93 kg (205 lb 0.4 oz)  07/26/14 93 kg (205 lb 0.4 oz)    Physical Exam:  Constitutional:  No acute distress.  HENT: orlal mucosa pink/moist  Head: Normocephalic.  Eyes: EOM are normal.  Neck:  #6 cuffed trach in place-- Cardiac rate controlled. No murmurs. Reg rhythm  Respiratory:  No distress. Better breath sounds  Upper airway sounds on exam.  GI: Soft. Bowel sounds are normal. He exhibits mild distension. G-tube site clean Neurological: .  Patient is able to provide his name, age and date of birth. Follows simple commands. CN exam grossly intact. UE: 4/5delt, 4/5 bicep/tricep/HI. LE's: 3/5 HF, 3+KE, 4 ankles. No gross sensory changes. Good sitting balance.  Psychiatric:  Flat. Appears alert, accurate with head nods Skin: trach site ok   Assessment/Plan: 1. Functional deficits secondary to severe deconditioning after respiratory failure which require 3+ hours per day of interdisciplinary therapy in a comprehensive inpatient rehab setting. Physiatrist is providing close team supervision and 24 hour management of active medical problems listed below. Physiatrist and rehab  team continue to assess barriers to discharge/monitor patient progress toward functional and medical goals.  Therapeutic ambulation with nurse or nurse tech only---at functional goals  FIM: FIM - Bathing Bathing Steps Patient Completed: Chest;Right Arm;Left Arm;Abdomen;Front perineal area;Buttocks;Right upper leg;Left upper leg;Right lower leg (including foot);Left lower leg (including foot) Bathing: 5: Supervision: Safety issues/verbal cues  FIM - Upper Body Dressing/Undressing Upper body dressing/undressing steps patient completed: Thread/unthread right sleeve of pullover shirt/dresss;Thread/unthread left sleeve of pullover shirt/dress;Put head through opening of pull over shirt/dress;Pull shirt over trunk Upper body dressing/undressing: 5: Supervision: Safety issues/verbal cues FIM - Lower Body Dressing/Undressing Lower body dressing/undressing steps patient completed: Thread/unthread right pants leg;Thread/unthread left pants leg;Pull pants up/down;Fasten/unfasten pants;Don/Doff right sock;Don/Doff left sock Lower body dressing/undressing: 5: Supervision: Safety issues/verbal cues  FIM - Toileting Toileting steps completed by patient: Adjust clothing prior to toileting;Performs perineal hygiene;Adjust clothing after toileting Toileting Assistive Devices: Grab bar or rail for support Toileting: 5: Supervision: Safety issues/verbal cues  FIM - Diplomatic Services operational officerToilet Transfers Toilet Transfers Assistive Devices: Bedside commode Toilet Transfers: 5-To toilet/BSC: Supervision (verbal cues/safety issues);5-From toilet/BSC: Supervision (verbal cues/safety issues)  FIM - Press photographerBed/Chair Transfer Bed/Chair Transfer Assistive Devices: Arm rests;Walker Bed/Chair Transfer: 5: Chair or W/C > Bed: Supervision (verbal cues/safety issues);5: Bed > Chair or W/C: Supervision (verbal cues/safety issues)  FIM - Locomotion: Wheelchair Distance: 150'x2 Locomotion: Wheelchair: 0: Activity did not occur FIM - Locomotion:  Ambulation Locomotion: Ambulation Assistive Devices: Designer, industrial/productWalker - Rolling Ambulation/Gait Assistance: 5: Supervision Locomotion: Ambulation: 5: Travels 150 ft or more with supervision/safety  issues  Comprehension Comprehension Mode: Auditory Comprehension: 5-Follows basic conversation/direction: With extra time/assistive device  Expression Expression Mode: Nonverbal Expression Assistive Devices: 6-Communication board Expression: 2-Expresses basic 25 - 49% of the time/requires cueing 50 - 75% of the time. Uses single words/gestures.  Social Interaction Social Interaction: 4-Interacts appropriately 75 - 89% of the time - Needs redirection for appropriate language or to initiate interaction.  Problem Solving Problem Solving: 5-Solves basic 90% of the time/requires cueing < 10% of the time  Memory Memory: 4-Recognizes or recalls 75 - 89% of the time/requires cueing 10 - 24% of the time  Medical Problem List and Plan:  1. Functional deficits secondary to debilitation after cardiac arrest/respiratory failure.  2. 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   -continue #6 cuffed trach for now per CCS--decrease to #4?---doing better  -IS/FV  -pulmonary toilet     2. DVT Prophylaxis/Anticoagulation: Subcutaneous heparin. Monitor platelet counts and any signs of bleeding  3. Pain Management: Tylenol as needed  4.  Severe Oral-pharyngeal --   -G-tube placed and he's tolerating TF 5. Neuropsych: This patient is capable of making decisions on his own behalf.   -  lexapro qhs for reactive derpession 6. Skin/Wound Care: Routine skin care. Trach care as directed  7. Fluids/Electrolytes/Nutrition:  Following weights and I's and o's  8. CAD/CABG. Patient has refused cardiac catheterization in the past  9. Diastolic congestive heart failure. Continue Lasix as directed.     -weights trending back up. Doesn't appear fluid overloaded---cxr pending 10. Diabetes mellitus with peripheral neuropathy.    -increased lantus to 65u qam and 65u qpm---may need further titration  -on diabetic formula  11. Tobacco abuse. Provide counseling   12. Chronic renal insufficiency. Baseline creatinine 1.5-2.   13. Extensive sinus disease left mastoid. Continue Augmentin for time being

## 2014-08-22 LAB — GLUCOSE, CAPILLARY
GLUCOSE-CAPILLARY: 239 mg/dL — AB (ref 70–99)
Glucose-Capillary: 272 mg/dL — ABNORMAL HIGH (ref 70–99)
Glucose-Capillary: 208 mg/dL — ABNORMAL HIGH (ref 70–99)
Glucose-Capillary: 134 mg/dL — ABNORMAL HIGH (ref 70–99)
Glucose-Capillary: 163 mg/dL — ABNORMAL HIGH (ref 70–99)
Glucose-Capillary: 230 mg/dL — ABNORMAL HIGH (ref 70–99)

## 2014-08-22 MED ORDER — DICLOFENAC SODIUM 1 % TD GEL
2.0000 g | Freq: Four times a day (QID) | TRANSDERMAL | Status: DC
  Administered 2014-08-22 – 2014-08-23 (×4): 2 g via TOPICAL
  Filled 2014-08-22: qty 100

## 2014-08-22 NOTE — Progress Notes (Signed)
[facility] PHYSICAL MEDICINE & REHABILITATION     PROGRESS NOTE     Subjective/Complaints:   no new complaints. No cough. Minimal secretions.    A  review of systems has been performed and if not noted above is otherwise negative.   Objective: Vital Signs: Blood pressure 108/88, pulse 87, temperature 99 F (37.2 C), temperature source Oral, resp. rate 18, height 5\' 2"  (1.575 m), weight 91.4 kg (201 lb 8 oz), SpO2 97.00%. No results found. No results found for this basename: WBC, HGB, HCT, PLT,  in the last 72 hours No results found for this basename: NA, K, CL, CO, GLUCOSE, BUN, CREATININE, CALCIUM,  in the last 72 hours CBG (last 3)   Recent Labs  08/21/14 2340 08/22/14 0344 08/22/14 0743  GLUCAP 228* 272* 208*    Wt Readings from Last 3 Encounters:  08/19/14 91.4 kg (201 lb 8 oz)  07/26/14 93 kg (205 lb 0.4 oz)  07/26/14 93 kg (205 lb 0.4 oz)    Physical Exam:  Constitutional:  No acute distress.  HENT: orlal mucosa pink/moist  Head: Normocephalic.  Eyes: EOM are normal.  Neck:  #6 cuffed trach in place-- Cardiac rate controlled. No murmurs. Reg rhythm  Respiratory:  No distress. Better breath sounds  Upper airway sounds on exam.  GI: Soft. Bowel sounds are normal. He exhibits mild distension. G-tube site clean Neurological: .  Patient is able to provide his name, age and date of birth. Follows simple commands. CN exam grossly intact. UE: 4/5delt, 4/5 bicep/tricep/HI. LE's: 3/5 HF, 3+KE, 4 ankles. No gross sensory changes. Good sitting balance.  Psychiatric:  Flat. Appears alert, accurate with head nods Skin: trach site ok   Assessment/Plan: 1. Functional deficits secondary to severe deconditioning after respiratory failure which require 3+ hours per day of interdisciplinary therapy in a comprehensive inpatient rehab setting. Physiatrist is providing close team supervision and 24 hour management of active medical problems listed below. Physiatrist and rehab  team continue to assess barriers to discharge/monitor patient progress toward functional and medical goals.    FIM: FIM - Bathing Bathing Steps Patient Completed: Chest;Right Arm;Left Arm;Abdomen;Front perineal area;Buttocks;Right upper leg;Left upper leg;Right lower leg (including foot);Left lower leg (including foot) Bathing: 5: Supervision: Safety issues/verbal cues  FIM - Upper Body Dressing/Undressing Upper body dressing/undressing steps patient completed: Thread/unthread right sleeve of pullover shirt/dresss;Thread/unthread left sleeve of pullover shirt/dress;Put head through opening of pull over shirt/dress;Pull shirt over trunk Upper body dressing/undressing: 5: Supervision: Safety issues/verbal cues FIM - Lower Body Dressing/Undressing Lower body dressing/undressing steps patient completed: Thread/unthread right pants leg;Thread/unthread left pants leg;Pull pants up/down;Fasten/unfasten pants;Don/Doff right sock;Don/Doff left sock Lower body dressing/undressing: 5: Supervision: Safety issues/verbal cues  FIM - Toileting Toileting steps completed by patient: Adjust clothing prior to toileting;Performs perineal hygiene;Adjust clothing after toileting Toileting Assistive Devices: Grab bar or rail for support Toileting: 5: Supervision: Safety issues/verbal cues  FIM - Diplomatic Services operational officerToilet Transfers Toilet Transfers Assistive Devices: Bedside commode Toilet Transfers: 5-To toilet/BSC: Supervision (verbal cues/safety issues);5-From toilet/BSC: Supervision (verbal cues/safety issues)  FIM - Press photographerBed/Chair Transfer Bed/Chair Transfer Assistive Devices: Arm rests;Walker Bed/Chair Transfer: 5: Chair or W/C > Bed: Supervision (verbal cues/safety issues);5: Bed > Chair or W/C: Supervision (verbal cues/safety issues)  FIM - Locomotion: Wheelchair Distance: 150'x2 Locomotion: Wheelchair: 0: Activity did not occur FIM - Locomotion: Ambulation Locomotion: Ambulation Assistive Devices: DealerWalker -  Rolling Ambulation/Gait Assistance: 5: Supervision Locomotion: Ambulation: 5: Travels 150 ft or more with supervision/safety issues  Comprehension Comprehension Mode: Auditory Comprehension: 5-Follows basic  conversation/direction: With extra time/assistive device  Expression Expression Mode: Nonverbal Expression Assistive Devices: 6-Communication board Expression: 2-Expresses basic 25 - 49% of the time/requires cueing 50 - 75% of the time. Uses single words/gestures.  Social Interaction Social Interaction: 4-Interacts appropriately 75 - 89% of the time - Needs redirection for appropriate language or to initiate interaction.  Problem Solving Problem Solving: 5-Solves basic 90% of the time/requires cueing < 10% of the time  Memory Memory: 4-Recognizes or recalls 75 - 89% of the time/requires cueing 10 - 24% of the time  Medical Problem List and Plan:  1. Functional deficits secondary to debilitation after cardiac arrest/respiratory failure.  2. 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   -#4 cuffless trach. Trial of PMV.   -IS/FV  -pulmonary toilet     2. DVT Prophylaxis/Anticoagulation: Subcutaneous heparin. Monitor platelet counts and any signs of bleeding  3. Pain Management: Tylenol as needed  4.  Severe Oral-pharyngeal --   -G-tube placed and he's tolerating TF  -reassess swallow today per SLP 5. Neuropsych: This patient is capable of making decisions on his own behalf.   -  lexapro qhs for reactive derpession 6. Skin/Wound Care: Routine skin care. Trach care as directed  7. Fluids/Electrolytes/Nutrition:  Following weights and I's and o's  8. CAD/CABG. Patient has refused cardiac catheterization in the past  9. Diastolic congestive heart failure. Continue Lasix as directed.     -weights trending back up. Doesn't appear fluid overloaded---cxr pending 10. Diabetes mellitus with peripheral neuropathy.   -increased lantus to 65u qam and 65u qpm---may need further  titration  -on diabetic formula  11. Tobacco abuse. Provide counseling   12. Chronic renal insufficiency. Baseline creatinine 1.5-2.   13. Extensive sinus disease left mastoid. Continue Augmentin for time being

## 2014-08-22 NOTE — Progress Notes (Signed)
Pt tolerating PMSV fairly well. Oxygen levels range between 91-99% throughout shift. No complaints of discomfort or distress from pt. Will continue to monitor

## 2014-08-22 NOTE — Plan of Care (Signed)
Problem: RH Other (Specify) Goal: RH LTG Other (Specify) Patient will tolerate the PMSV during all waking hours without s/s of distress with Mod I.

## 2014-08-22 NOTE — Progress Notes (Addendum)
Inpatient Diabetes Program Recommendations  AACE/ADA: New Consensus Statement on Inpatient Glycemic Control (2013)  Target Ranges:  Prepandial:   less than 140 mg/dL      Peak postprandial:   less than 180 mg/dL (1-2 hours)      Critically ill patients:  140 - 180 mg/dL   CBG's running in mid 200's with tube feedings q 4 hrs.  Concern with trying to cover with lantus could pose a problem if/when tube feeds are discontinued once lantus has been given. Would be a safety measure to use tube feed coverage q 4 hrs and reduce lantus to home dose of 40 units once a day/[facility] Inpatient Diabetes Program Recommendations Insulin - Basal: xxx Correction (SSI): xxx Insulin - Meal Coverage: Please consider ordering Novolog 4 units Q4H for tube feeding coverage. Diet: Question if changing Vital to Glucerna would be less carbohydrates. Please consider discussing with RD.  Thank you, Richard CoffinAnn Robecca Fulgham, RN, CNS, Diabetes Coordinator [phone])   Addendum 10/28) Pt's home dose is 40 units lantus bid (not once daily as noted yesterday in above note).

## 2014-08-22 NOTE — Progress Notes (Signed)
Passy-Muir Speaking Valve - Evaluation Patient Details  Name: Richard [name] MRN: [id number] Date of Birth: 10/21/1961  Today's Date: 08/22/2014 SLP Individual Time: 1130-1200 SLP Individual (min): 30 min   Past Medical History:  Past Medical History  Diagnosis Date  . CAD (coronary artery disease) of artery bypass graft 07/11/2014  . Cardiac arrest 07/11/2014  . CHF (congestive heart failure) 07/11/2014  . HTN (hypertension) 07/11/2014  . COPD (chronic obstructive pulmonary disease) 07/11/2014  . DM (diabetes mellitus) 07/11/2014  . GERD (gastroesophageal reflux disease) 07/11/2014  . Acute on chronic kidney failure 07/11/2014   Past Surgical History: History reviewed. No pertinent past surgical history.  HPI: Patient is a 53 y.o. right hand male multi-medical with history of diastolic congestive heart failure, tobacco abuse, coronary artery disease with CABG, COPD, type 2 diabetes mellitus, chronic renal insufficiency. Independent with a cane due to back pain prior to admission but has a complex social situation as his children are mentally disabled and being taken care of by other individuals at this time. [facility] 07/11/2014 with generalized weakness findings of hyperkalemia and altered mental status as well white blood cell count 14,000 blood pressure elevated 199/114. While undergoing echocardiogram suddenly developed VF/VT arrest. CT angiogram chest negative for pulmonary emboli. Required intubation for respiratory failure with placement of percutaneous tracheostomy 07/21/2014 per critical care medicine. Patient refused cardiac catheterization to evaluate his coronary artery disease. [facility] and demonstrated difficulty tolerating a PMSV due to large amounts of thick secretions. Patient was also administered a FEES and demonstrated silent aspiration with all consistencies tested and eventually underwent PEG placement.  Patient was eventually discharged from skilled SLP services due need to have cuff inflated at all times. Since then, the patient has had decreased secretion's and was recently downgraded to a #4 cuffless trach, therefore, SLP services were re-ordered to assess tolerance of PMSV.    Assessment / Plan / Recommendation Clinical Impression  Patient was administered a PMSV evaluation due to decreased secretions and recent downsize to a Shiley #4 cuffless trach.  The PMSV was placed for ~30 minutes with all vitals remaining WFL and no signs of air trapping. Patient demonstrated an efficient vocal intensity and was able to express his wants/needs at the sentence level without difficulty. Recommend patient wear the PMSV during all waking hours at this time.  A BSE will be administered later today to assess swallow function and readiness for a possible repeat objective study.     SLP Assessment  Patient needs continued Speech Lanaguage Pathology Services    Follow Up Recommendations  SNF due to need for 24 hour supervision   Frequency and Duration 5X/week X 1 week    Pertinent Vitals/Pain ALL vitals    SLP Goals Potential to Achieve Goals: Good Progress/Goals/Alternative treatment plan discussed with pt/caregiver and they: Agree   PMSV Trial  PMSV was placed for: Patient was administered a PMSV evaluation due to decreased secretions and recent downsize to a Shiley #4 cuffless trach.  The PMSV was placed for ~30 minutes with all vitals remaining WFL and no signs of air trapping. Patient demonstrated an efficient vocal intensity and was able to express his wants/needs at the sentence level without difficulty. Recommend patient wear the PMSV during all waking hours at this time.  Able to redirect subglottic air through upper airway: Yes Able to Attain Phonation: Yes Voice Quality: Normal Able to Expectorate Secretions: No attempts Level of Secretion Expectoration  with PMSV: Not observed Breath  Support for Phonation: Adequate Intelligibility: Intelligible Word: 75-100% accurate Phrase: 75-100% accurate Sentence: 75-100% accurate Conversation: 75-100% accurate SpO2 During Trial: 96 % Pulse During Trial: 75 Behavior: Alert;Cooperative;Responsive to questions   Tracheostomy Tube  #4 Cuffless   Vent Dependency  FiO2 (%): 28 %       Tamantha Saline 08/22/2014, 1:11 PM

## 2014-08-22 NOTE — Evaluation (Signed)
Speech Language Pathology Bedside Swallow Assessment  Patient Details  Name: Richard [name] MRN: [id number] Date of Birth: 1961-06-29  SLP Diagnosis: Dysphagia  Rehab Potential: Good ELOS: 1 week     Today's Date: 08/22/2014 SLP Individual Time: 5009-3818 SLP Individual Time Calculation (min): 20 min   Problem List:  Patient Active Problem List   Diagnosis Date Noted  . Tracheitis 07/28/2014  . Tracheostomy status 07/28/2014  . Debilitated 07/26/2014  . Acute on chronic kidney failure 07/11/2014  . Hyperkalemia 07/11/2014  . CHF (congestive heart failure) 07/11/2014  . DM (diabetes mellitus) 07/11/2014  . CAD (coronary artery disease) of artery bypass graft 07/11/2014  . HTN (hypertension) 07/11/2014  . COPD (chronic obstructive pulmonary disease) 07/11/2014  . GERD (gastroesophageal reflux disease) 07/11/2014  . Acute encephalopathy 07/11/2014  . CAP (community acquired pneumonia) 07/11/2014  . Cardiac arrest 07/11/2014   Past Medical History:  Past Medical History  Diagnosis Date  . CAD (coronary artery disease) of artery bypass graft 07/11/2014  . Cardiac arrest 07/11/2014  . CHF (congestive heart failure) 07/11/2014  . HTN (hypertension) 07/11/2014  . COPD (chronic obstructive pulmonary disease) 07/11/2014  . DM (diabetes mellitus) 07/11/2014  . GERD (gastroesophageal reflux disease) 07/11/2014  . Acute on chronic kidney failure 07/11/2014   Past Surgical History: History reviewed. No pertinent past surgical history.  Assessment / Plan / Recommendation Clinical Impression See previous assessment for full history; patient known to SLP services from earlier this admission however, due to poor secretion and PMSV tolerance goals discontinued.  Patient with current medical improvements, increased ability to self manage secretions and tolerate PMSV therefore repeat bedside orders received.  Bedside swallow evaluation completed with an unremarkable oral phase and suspected  pharyngeal impairments due to a delayed swallow response with decreased hyolaryngeal excursion and delayed coughing on thin and puree textures.  As a result, a repeat objective assessment is warranted prior to initiation of PO.  Recommend FEES given previous records indicating swelling that impacted swallow, which would not be visualized during an MBS.     Skilled Therapeutic Interventions          Bedside swallow evaluation completed with results and recommendations reviewed with patient.    SLP Assessment  Patient will need skilled Speech Lanaguage Pathology Services during CIR admission    Recommendations  Patient may use Passy-Muir Speech Valve: During all waking hours (remove during sleep) PMSV Supervision: Intermittent Recommended Consults: FEES Diet Recommendations: NPO;Alternative means - long-term Medication Administration: Via alternative means Oral Care Recommendations: Oral care Q4 per protocol Patient destination: [place] (SNF) Follow up Recommendations: 24 hour supervision/assistance;Skilled Nursing facility Equipment Recommended: Other (comment) (TBD)    SLP Frequency 5 out of 7 days   SLP Treatment/Interventions Cueing hierarchy;Dysphagia/aspiration precaution training;Environmental controls;Functional tasks;Internal/external aids;Oral motor exercises;Patient/family education    Pain  0  Short Term Goals: Week 4: SLP Short Term Goal 1 (Week 4): Pt will participate in an objective swallow assessment   See FIM for current functional status Refer to Care Plan for Long Term Goals  Recommendations for other services: None  Discharge Criteria: Patient will be discharged from SLP if patient refuses treatment 3 consecutive times without medical reason, if treatment goals not met, if there is a change in medical status, if patient makes no progress towards goals or if patient is discharged from hospital.  The above assessment, treatment plan, treatment  alternatives and goals were discussed and mutually agreed upon: by patient  Richard [name], M.A., CCC-SLP [phone]  Richard [name] 08/22/2014, 1:51 PM

## 2014-08-23 ENCOUNTER — Inpatient Hospital Stay (HOSPITAL_COMMUNITY): Payer: Medicaid Other | Admitting: Speech Pathology

## 2014-08-23 LAB — GLUCOSE, CAPILLARY
GLUCOSE-CAPILLARY: 224 mg/dL — AB (ref 70–99)
GLUCOSE-CAPILLARY: 195 mg/dL — AB (ref 70–99)
Glucose-Capillary: 211 mg/dL — ABNORMAL HIGH (ref 70–99)

## 2014-08-23 MED ORDER — INSULIN GLARGINE 100 UNIT/ML ~~LOC~~ SOLN: 40.0000 [IU] | Freq: Every day | SUBCUTANEOUS | Status: DC

## 2014-08-23 MED ORDER — INSULIN GLARGINE 100 UNIT/ML ~~LOC~~ SOLN
40.0000 [IU] | Freq: Every day | SUBCUTANEOUS | Status: DC
  Filled 2014-08-23: qty 0.4

## 2014-08-23 MED ORDER — PANTOPRAZOLE SODIUM 40 MG PO TBEC
40.0000 mg | DELAYED_RELEASE_TABLET | Freq: Every day | ORAL | Status: DC
  Administered 2014-08-23: 40 mg via ORAL
  Filled 2014-08-23: qty 1

## 2014-08-23 MED ORDER — INSULIN ASPART 100 UNIT/ML ~~LOC~~ SOLN
4.0000 [IU] | SUBCUTANEOUS | Status: DC
  Administered 2014-08-23: 4 [IU] via SUBCUTANEOUS

## 2014-08-23 NOTE — Progress Notes (Signed)
Pt discharged at 1330 via care link to SNF. Belongings with pt. PMSV in place with O2 99%.

## 2014-08-23 NOTE — Progress Notes (Signed)
Patient scheduled for discharge to nursing home today. Swallow study completed day of discharge recommendations of dysphagia 3 thin liquid diet. Will change to feeds to 7 PM to 7 AM daily and hold tube feeds if consumes greater than 50% of meals

## 2014-08-23 NOTE — Progress Notes (Signed)
Speech Language Pathology Discharge Summary  Patient Details  Name: LONZY MATO MRN: [id number] Date of Birth: 13-Nov-1960   Patient has met 1 of 1 long term goals.  Patient to discharge at overall Supervision level for use of PMSV and utilization of swallowing compensatory strategies, as noted in previous discharge, patient requires increased cueing for overall cognitive function with familiar tasks.   Reasons goals not met: N/A   Clinical Impression/Discharge Summary: Patient has made functional gains and has met all LTG's at this time due to increased tolerance of the PMSV and increased swallowing function.  Currently, the patient has a #4 cuffless trach and his tolerating the PMSV during all waking hours but requires assistance to donn/doff at this time.  Patient was administered a FEES today and was recommended to initiate Dys. 3 textures with thin liquids with intermittent supervision. Patient's family is unable to provide the necessary assistance needed at this time, therefore, patient will discharge to a SNF. Patient would benefit f/u SLP services to maximize his cognitive-linguistic, speech and swallowing function in order to maximize his overall functional independence.   Care Partner:  Caregiver Able to Provide Assistance: No  Type of Caregiver Assistance: Physical;Cognitive  Recommendation:  24 hour supervision/assistance;Skilled Nursing facility  Rationale for SLP Follow Up: Maximize functional communication;Maximize swallowing safety;Maximize cognitive function and independence;Reduce caregiver burden   Equipment: PMSV   Reasons for discharge: Treatment goals met;Discharged from hospital   Patient/Family Agrees with Progress Made and Goals Achieved: Yes   See FIM for current functional status  Alexsandria Kivett 08/23/2014, 2:32 PM

## 2014-08-23 NOTE — Progress Notes (Signed)
[facility] PHYSICAL MEDICINE & REHABILITATION     PROGRESS NOTE     Subjective/Complaints:   no new complaints. No cough. Minimal secretions.    A  review of systems has been performed and if not noted above is otherwise negative.   Objective: Vital Signs: Blood pressure 112/96, pulse 71, temperature 99.2 F (37.3 C), temperature source Oral, resp. rate 19, height 5\' 2"  (1.575 m), weight 91.4 kg (201 lb 8 oz), SpO2 91.00%. No results found. No results found for this basename: WBC, HGB, HCT, PLT,  in the last 72 hours No results found for this basename: NA, K, CL, CO, GLUCOSE, BUN, CREATININE, CALCIUM,  in the last 72 hours CBG (last 3)   Recent Labs  08/22/14 2000 08/22/14 2347 08/23/14 0412  GLUCAP 230* 239* 211*    Wt Readings from Last 3 Encounters:  08/19/14 91.4 kg (201 lb 8 oz)  07/26/14 93 kg (205 lb 0.4 oz)  07/26/14 93 kg (205 lb 0.4 oz)    Physical Exam:  Constitutional:  No acute distress.  HENT: orlal mucosa pink/moist  Head: Normocephalic.  Eyes: EOM are normal.  Neck:  #6 cuffed trach in place-- Cardiac rate controlled. No murmurs. Reg rhythm  Respiratory:  No distress. Better breath sounds  Upper airway sounds on exam.  GI: Soft. Bowel sounds are normal. He exhibits mild distension. G-tube site clean Neurological: .  Patient is able to provide his name, age and date of birth. Follows simple commands. CN exam grossly intact. UE: 4/5delt, 4/5 bicep/tricep/HI. LE's: 3/5 HF, 3+KE, 4 ankles. No gross sensory changes. Good sitting balance.  Psychiatric:  Flat. Appears alert, accurate with head nods Skin: trach site ok   Assessment/Plan: 1. Functional deficits secondary to severe deconditioning after respiratory failure which require 3+ hours per day of interdisciplinary therapy in a comprehensive inpatient rehab setting. Physiatrist is providing close team supervision and 24 hour management of active medical problems listed below. Physiatrist and  rehab team continue to assess barriers to discharge/monitor patient progress toward functional and medical goals.    FIM: FIM - Bathing Bathing Steps Patient Completed: Chest;Right Arm;Left Arm;Abdomen;Front perineal area;Buttocks;Right upper leg;Left upper leg;Right lower leg (including foot);Left lower leg (including foot) Bathing: 5: Supervision: Safety issues/verbal cues  FIM - Upper Body Dressing/Undressing Upper body dressing/undressing steps patient completed: Thread/unthread right sleeve of pullover shirt/dresss;Thread/unthread left sleeve of pullover shirt/dress;Put head through opening of pull over shirt/dress;Pull shirt over trunk Upper body dressing/undressing: 5: Supervision: Safety issues/verbal cues FIM - Lower Body Dressing/Undressing Lower body dressing/undressing steps patient completed: Thread/unthread right pants leg;Thread/unthread left pants leg;Pull pants up/down;Fasten/unfasten pants;Don/Doff right sock;Don/Doff left sock Lower body dressing/undressing: 5: Supervision: Safety issues/verbal cues  FIM - Toileting Toileting steps completed by patient: Adjust clothing prior to toileting;Performs perineal hygiene;Adjust clothing after toileting Toileting Assistive Devices: Grab bar or rail for support Toileting: 5: Supervision: Safety issues/verbal cues  FIM - Diplomatic Services operational officerToilet Transfers Toilet Transfers Assistive Devices: Bedside commode Toilet Transfers: 5-To toilet/BSC: Supervision (verbal cues/safety issues)  FIM - BankerBed/Chair Transfer Bed/Chair Transfer Assistive Devices: Arm rests;Walker Bed/Chair Transfer: 5: Chair or W/C > Bed: Supervision (verbal cues/safety issues);5: Bed > Chair or W/C: Supervision (verbal cues/safety issues)  FIM - Locomotion: Wheelchair Distance: 150'x2 Locomotion: Wheelchair: 0: Activity did not occur FIM - Locomotion: Ambulation Locomotion: Ambulation Assistive Devices: Designer, industrial/productWalker - Rolling Ambulation/Gait Assistance: 5: Supervision Locomotion:  Ambulation: 5: Travels 150 ft or more with supervision/safety issues  Comprehension Comprehension Mode: Auditory Comprehension: 5-Understands complex 90% of the time/Cues <  10% of the time  Expression Expression Mode: Verbal Expression Assistive Devices: 6-Talk trach valve Expression: 5-Expresses basic needs/ideas: With no assist  Social Interaction Social Interaction: 5-Interacts appropriately 90% of the time - Needs monitoring or encouragement for participation or interaction.  Problem Solving Problem Solving: 5-Solves basic 90% of the time/requires cueing < 10% of the time  Memory Memory: 5-Recognizes or recalls 90% of the time/requires cueing < 10% of the time  Medical Problem List and Plan:  1. Functional deficits secondary to debilitation after cardiac arrest/respiratory failure.  2. 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   -#4 cuffless trach. Trial of PMV.   -IS/FV  -pulmonary toilet     2. DVT Prophylaxis/Anticoagulation: Subcutaneous heparin. Monitor platelet counts and any signs of bleeding  3. Pain Management: Tylenol as needed  4.  Severe Oral-pharyngeal --   -G-tube placed and he's tolerating TF  -tolerating PMV 5. Neuropsych: This patient is capable of making decisions on his own behalf.   -  lexapro qhs for reactive derpession 6. Skin/Wound Care: Routine skin care. Trach care as directed  7. Fluids/Electrolytes/Nutrition:  Following weights and I's and o's  8. CAD/CABG. Patient has refused cardiac catheterization in the past  9. Diastolic congestive heart failure. Continue Lasix as directed.     -weights trending back up. Doesn't appear fluid overloaded---cxr pending 10. Diabetes mellitus with peripheral neuropathy.   -agree with lantus decrease and scheduling of novolog q4 hours. Will decrease lantus to 40u bid for now  -can decrease further at SNF  -on diabetic formula  11. Tobacco abuse. Provide counseling   12. Chronic renal insufficiency.  Baseline creatinine 1.5-2.   13. Extensive sinus disease left mastoid.  No change

## 2014-08-23 NOTE — Progress Notes (Signed)
  Noted my recommendation yesterday to use 4 units meal coverage q 4 hrs and use home dose of lantus for basal needs.  Home dose is lantus 40 units bid (not once daily as written yesterday).   Noted today's order for second dose of lantus 40 units is on 'hold'. Please resume as originally ordered for lantus 40 units bid.  Thank you, Lenor CoffinAnn Ireoluwa Grant, RN, CNS, Diabetes Coordinator [phone])

## 2014-08-23 NOTE — Progress Notes (Signed)
Social Work Discharge Note  The overall goal for the admission was met for:   Discharge location: No - plan changed to SNF due to family unable to meet care needs (with trach and peg)  Length of Stay: No - extended due to SNF bed search and upgrading of PMSV and diet - LOS=28 days  Discharge activity level: Yes - supervision overall  Home/community participation: No  Services provided included: MD, RD, PT, OT, SLP, RN, TR, Pharmacy and SW  Financial Services: Medicaid  Follow-up services arranged: Other: SN[facility] placement at [facility]  Comments (or additional information):  Patient/Family verbalized understanding of follow-up arrangements: Yes  Individual responsible for coordination of the follow-up plan: patient  Confirmed correct DME delivered: NA    Richard [name]

## 2014-08-23 NOTE — Procedures (Signed)
Objective Swallowing Evaluation: Fiberoptic Endoscopic Evaluation of Swallowing  Patient Details  Name: Richard [name] MRN: [id number] Date of Birth: [birth year]  Today's Date: 08/23/2014 Time:  -     Past Medical History:  Past Medical History  Diagnosis Date  . CAD (coronary artery disease) of artery bypass graft 07/11/2014  . Cardiac arrest 07/11/2014  . CHF (congestive heart failure) 07/11/2014  . HTN (hypertension) 07/11/2014  . COPD (chronic obstructive pulmonary disease) 07/11/2014  . DM (diabetes mellitus) 07/11/2014  . GERD (gastroesophageal reflux disease) 07/11/2014  . Acute on chronic kidney failure 07/11/2014   Past Surgical History: History reviewed. No pertinent past surgical history. HPI:  53 y.o. male transferred from [facility] 07/11/14 (presented to hospital with hyperkalemia and altered mental status). 07/11/14 cardiac arrest and intubated; extubated and reintubated 07/15/14.  Trach 9/24. Acute on chronic renal failure.  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 CABG), GERD, COPD, diabetes, chronic kidney disease      Recommendation/Prognosis  Clinical Impression:   Dysphagia Diagnosis: Mild pharyngeal phase dysphagia;Mild cervical esophageal phase dysphagia  Clinical impression: Pt presents with significant improvements since most recent FEES 07/28/14 due to decreased edema and increased strength, resulting in overall more efficient pharyngeal swallow response. Pt has a mild pharyngeal and cervical esophageal dysphagia marked by a mild delay in swallow initiation and continued mild weakness with his base of tongue and hyolaryngeal movement, which leads to mild residue in the valleculae with all consistencies and at the pyriform sinuses with liquids. Pt has multiple reflexive swallows per bolus, which assists with pharyngeal clearance. Trace amounts of liquid residual flow from the pyriform sinuses into the interarytenoid space, which are at risk  for aspiration after the swallow. Fortunately, a cued throat clear and effortful swallow are effective at clearing residuals from the interarytenoid space and the pyriform sinuses. Throughout challenging during this study, pt was only observed to penetrate his secretions, which was sensed and cleared with a reflexive throat clear. Recommend that patient initiate Dys 3 textures and thin liquids with use of multiple swallows and intermittent throat clear, followed by effortful swallow, to maximize safety with intake.   Swallow Evaluation Recommendations:  Diet Recommendations: Dysphagia 3 (Mechanical Soft);Thin liquid Liquid Administration via: Cup;Straw Medication Administration: Whole meds with puree Supervision: Patient able to self feed;Intermittent supervision to cue for compensatory strategies Compensations: Slow rate;Small sips/bites;Clear throat intermittently;Multiple dry swallows after each bite/sip (PMSV must be in place) Postural Changes and/or Swallow Maneuvers: Seated upright 90 degrees;Out of bed for meals Oral Care Recommendations: Oral care BID Follow up Recommendations: Skilled Nursing facility    Prognosis:  Prognosis for Safe Diet Advancement: Good   Individuals Consulted: Consulted and Agree with Results and Recommendations: PA;RN;Other (Comment);Patient (treating SLP)      SLP Assessment/Plan  Plan:  Potential to Achieve Goals: Good   Short Term Goals: Week 4: SLP Short Term Goal 1 (Week 4): Pt will participate in an objective swallow assessment  SLP Short Term Goal 1 - Progress (Week 4): Met    General: Date of Onset: 07/11/14 Type of Study: Fiberoptic Endoscopic Evaluation of Swallowing Reason for Referral: Objectively evaluate swallowing function Previous Swallow Assessment: BSE on 9/26 and recommended Dys. 2 textures with nectar-thick liquids; repeat BSE 9/30 recommending NPO pending FEES; FEES 10/1 recommended NPO; BSE 10/26 recommending repeat objective  testing Diet Prior to this Study: NPO;PEG tube Temperature Spikes Noted: No Respiratory Status: Trach collar Trach Size and Type: #4;Uncuffed;With PMSV in place History of Recent Intubation:  Yes Length of Intubations (days): 10 days Date extubated: 07/21/14 Behavior/Cognition: Alert;Cooperative;Hard of hearing Oral Cavity - Dentition: Edentulous Self-Feeding Abilities: Able to feed self Patient Positioning: Upright in chair Baseline Vocal Quality: Clear Volitional Cough: Strong Volitional Swallow: Able to elicit Anatomy: Other (Comment) (edema improved since previous FEES) Pharyngeal Secretions: Standing secretions in (comment) (pyriform sinuses (intermittently throughout testing))   Reason for Referral:   Objectively evaluate swallowing function    Oral Phase: Oral Preparation/Oral Phase Oral Phase: WFL   Pharyngeal Phase:  Pharyngeal Phase Pharyngeal Phase: Impaired Pharyngeal - Nectar Pharyngeal - Nectar Teaspoon: Delayed swallow initiation;Reduced anterior laryngeal mobility;Reduced laryngeal elevation;Reduced tongue base retraction;Pharyngeal residue - valleculae;Pharyngeal residue - pyriform sinuses;Inter-arytenoid space residue Pharyngeal - Nectar Cup: Delayed swallow initiation;Reduced anterior laryngeal mobility;Reduced laryngeal elevation;Reduced tongue base retraction;Pharyngeal residue - valleculae;Pharyngeal residue - pyriform sinuses;Inter-arytenoid space residue Pharyngeal - Thin Pharyngeal - Ice Chips: Not tested Pharyngeal - Thin Teaspoon: Delayed swallow initiation;Reduced anterior laryngeal mobility;Reduced laryngeal elevation;Reduced tongue base retraction;Pharyngeal residue - valleculae;Pharyngeal residue - pyriform sinuses;Inter-arytenoid space residue Pharyngeal - Thin Cup: Delayed swallow initiation;Reduced anterior laryngeal mobility;Reduced laryngeal elevation;Reduced tongue base retraction;Pharyngeal residue - valleculae;Pharyngeal residue - pyriform  sinuses;Inter-arytenoid space residue Pharyngeal - Thin Straw: Delayed swallow initiation;Reduced anterior laryngeal mobility;Reduced laryngeal elevation;Reduced tongue base retraction;Pharyngeal residue - valleculae;Pharyngeal residue - pyriform sinuses Pharyngeal - Solids Pharyngeal - Puree: Delayed swallow initiation;Reduced anterior laryngeal mobility;Reduced laryngeal elevation;Reduced tongue base retraction;Pharyngeal residue - valleculae Penetration/Aspiration details (puree): Material does not enter airway Pharyngeal - Regular: Reduced anterior laryngeal mobility;Reduced laryngeal elevation;Reduced tongue base retraction;Pharyngeal residue - valleculae;Premature spillage to pyriform sinuses   Cervical Esophageal Phase  Cervical Esophageal Phase Cervical Esophageal Phase: Impaired Cervical Esophageal Phase - Comment Cervical Esophageal Comment: reduced relaxation   GN         Germain Osgood, M.A. CCC-SLP [phone]  Germain Osgood 08/23/2014, 10:43 AM

## 2014-11-18 ENCOUNTER — Ambulatory Visit: Payer: Medicaid Other

## 2014-12-08 ENCOUNTER — Ambulatory Visit (INDEPENDENT_AMBULATORY_CARE_PROVIDER_SITE_OTHER): Payer: Medicaid Other

## 2014-12-08 VITALS — BP 169/86 | HR 72 | Resp 12

## 2014-12-08 DIAGNOSIS — M79676 Pain in unspecified toe(s): Secondary | ICD-10-CM

## 2014-12-08 DIAGNOSIS — E1140 Type 2 diabetes mellitus with diabetic neuropathy, unspecified: Secondary | ICD-10-CM

## 2014-12-08 DIAGNOSIS — B351 Tinea unguium: Secondary | ICD-10-CM

## 2014-12-08 NOTE — Patient Instructions (Signed)
Diabetes and Foot Care Diabetes may cause you to have problems because of poor blood supply (circulation) to your feet and legs. This may cause the skin on your feet to become thinner, break easier, and heal more slowly. Your skin may become dry, and the skin may peel and crack. You may also have nerve damage in your legs and feet causing decreased feeling in them. You may not notice minor injuries to your feet that could lead to infections or more serious problems. Taking care of your feet is one of the most important things you can do for yourself.  HOME CARE INSTRUCTIONS  Wear shoes at all times, even in the house. Do not go barefoot. Bare feet are easily injured.  Check your feet daily for blisters, cuts, and redness. If you cannot see the bottom of your feet, use a mirror or ask someone for help.  Wash your feet with warm water (do not use hot water) and mild soap. Then pat your feet and the areas between your toes until they are completely dry. Do not soak your feet as this can dry your skin.  Apply a moisturizing lotion or petroleum jelly (that does not contain alcohol and is unscented) to the skin on your feet and to dry, brittle toenails. Do not apply lotion between your toes.  Trim your toenails straight across. Do not dig under them or around the cuticle. File the edges of your nails with an emery board or nail file.  Do not cut corns or calluses or try to remove them with medicine.  Wear clean socks or stockings every day. Make sure they are not too tight. Do not wear knee-high stockings since they may decrease blood flow to your legs.  Wear shoes that fit properly and have enough cushioning. To break in new shoes, wear them for just a few hours a day. This prevents you from injuring your feet. Always look in your shoes before you put them on to be sure there are no objects inside.  Do not cross your legs. This may decrease the blood flow to your feet.  If you find a minor scrape,  cut, or break in the skin on your feet, keep it and the skin around it clean and dry. These areas may be cleansed with mild soap and water. Do not cleanse the area with peroxide, alcohol, or iodine.  When you remove an adhesive bandage, be sure not to damage the skin around it.  If you have a wound, look at it several times a day to make sure it is healing.  Do not use heating pads or hot water bottles. They may burn your skin. If you have lost feeling in your feet or legs, you may not know it is happening until it is too late.  Make sure your health care provider performs a complete foot exam at least annually or more often if you have foot problems. Report any cuts, sores, or bruises to your health care provider immediately. SEEK MEDICAL CARE IF:   You have an injury that is not healing.  You have cuts or breaks in the skin.  You have an ingrown nail.  You notice redness on your legs or feet.  You feel burning or tingling in your legs or feet.  You have pain or cramps in your legs and feet.  Your legs or feet are numb.  Your feet always feel cold. SEEK IMMEDIATE MEDICAL CARE IF:   There is increasing redness,   swelling, or pain in or around a wound.  There is a red line that goes up your leg.  Pus is coming from a wound.  You develop a fever or as directed by your health care provider.  You notice a bad smell coming from an ulcer or wound. Document Released: 10/11/2000 Document Revised: 06/16/2013 Document Reviewed: 03/23/2013 Exit[employer] Patient Information 2015 [employer]. This information is not intended to replace advice given to you by your health care provider. Make sure you discuss any questions you have with your health care provider.  

## 2014-12-08 NOTE — Progress Notes (Signed)
   Subjective:    Patient ID: Richard [name], male    DOB: 1961-01-04, 54 y.o.   MRN: [id number]  HPI  Toenails trim and check diabetic feet.  Review of Systems  Respiratory: Positive for shortness of breath and wheezing.   Cardiovascular: Positive for leg swelling.  Musculoskeletal: Positive for back pain and gait problem.  All other systems reviewed and are negative.      Objective:   Physical Exam 54 year old white male well-developed well-nourished oriented 3 presents this time for diabetic foot care and evaluation. Agent does have some thick brittle criptotic nails he is tried self debriding in the past they get painful tenderness symptomatic and times does have some history of diabetes with Neuropathy or Abnormal Sensation Currently Taking Lyrica Indicates Doesn't Seem to Be Helping with That 3 Times A Day down to Once a Day Due To the Potential Side Effects However Continues to Have Difficulty with Swelling in His Feet and Abnormal Sensations. Will Not Resolve Is Slightly Improved. Orthopedic Biomechanical Exam Rectus Foot Type Ankle Metatarsal Subtalar Joint Motions Normal Mild Semirigid Digital Contractures Noted with Hammertoe Type Deformity No Open Wounds No Ulcers No Secondary Infections Nails Thick Yellow Brittle Crumbly and Dystrophic Consistent with Early Onychomycosis and Dystrophy of Nails 1 through 5 Bilateral Tender Both on Palpation and Include Shoe Wear and Ambulation. On Neurologic Evaluation There Is Intact Although Decreased Vibratory Sensation There Is Normal Plantar Response DTRs Not Elicited at This Time Patient Does Have Decreased Epicritic Sensation on Semmes Weinstein to the Forefoot Inferior Heel Intact Sensation Dorsal Foot into the Digits. Again Normal Plantar Response Noted Nails Criptotic Incurvated Tender on Palpation. Pulses Pedal Pulses Are Palpable DP +2 over 4 PT 1 over 4 Bilateral Skin Temperature Is Warm to Cool Turgor Somewhat Diminished Absent Hair  Growth Is Noted. Remainder the Exam Unremarkable Noncontributory.       Assessment & Plan:  Assessment diabetes history peripheral neuropathy posse mild angiopathy. Dystrophic brittle criptotic nails painful symptomatically mycotic nails debrided 1 through 5 bilateral return for future palliative care is needed recommend a repeat palliative care every 3 months maintain probing accommodative shoes at all times.  Alvan Dameichard Trice Aspinall DPM

## 2015-03-09 ENCOUNTER — Ambulatory Visit: Payer: Medicaid Other | Admitting: Podiatrist

## 2015-03-10 ENCOUNTER — Ambulatory Visit: Payer: Medicaid Other | Admitting: Podiatrist

## 2015-03-16 ENCOUNTER — Ambulatory Visit (INDEPENDENT_AMBULATORY_CARE_PROVIDER_SITE_OTHER): Payer: Medicaid Other | Admitting: Podiatrist

## 2015-03-16 ENCOUNTER — Encounter: Payer: Self-pay | Admitting: Podiatrist

## 2015-03-16 DIAGNOSIS — B351 Tinea unguium: Secondary | ICD-10-CM

## 2015-03-16 DIAGNOSIS — E1140 Type 2 diabetes mellitus with diabetic neuropathy, unspecified: Secondary | ICD-10-CM | POA: Diagnosis not present

## 2015-03-17 NOTE — Progress Notes (Signed)
HPI: Patient presents today for follow up of diabetic foot and nail care. Past medical history, meds, and allergies reviewed. Patient states blood sugar is under good  control.   Objective:   Objective:  Patients chart is reviewed.  Vascular status reveals pedal pulses noted at  1 out of 4 dp and pt bilateral .  Neurological sensation is Decreased to [facility]alsSemmes Weinstein monofilament bilateral at 2/5 sites bilateral.  Dermatological exam reveals  absence of pre ulcerative/ hyperkeratotic lesions.   Toenails are elongated, incurvated, discolored, dystrophic with ingrown deformity present.    Assessment: Diabetes with Neuropathy, symptomatic and mycotic toenails.  Plan: Discussed treatment options and alternatives. Debrided nails without complication. .  Return appointment recommended at routine intervals of 3 months.   Marlowe AschoffKathryn Isaiha Asare, DPM

## 2015-06-19 ENCOUNTER — Ambulatory Visit: Payer: Medicaid Other | Admitting: Podiatrist

## 2015-12-08 IMAGING — US US RENAL PORT
1 series · 14 of 25 positions shown · non-contrast
Comparison: Abdomen CT dated 06/09/2014.

CLINICAL DATA: Acute renal failure.  Chronic renal insufficiency.

EXAM:
RENAL/URINARY TRACT ULTRASOUND COMPLETE

[Series 1: us renal port · 0.29mm/px · 14 of 31 slices shown]
[im 1/31]
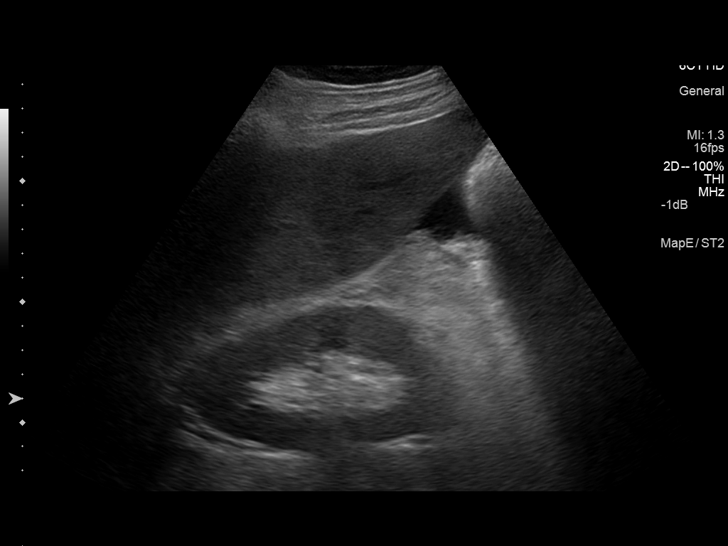
[im 3/31]
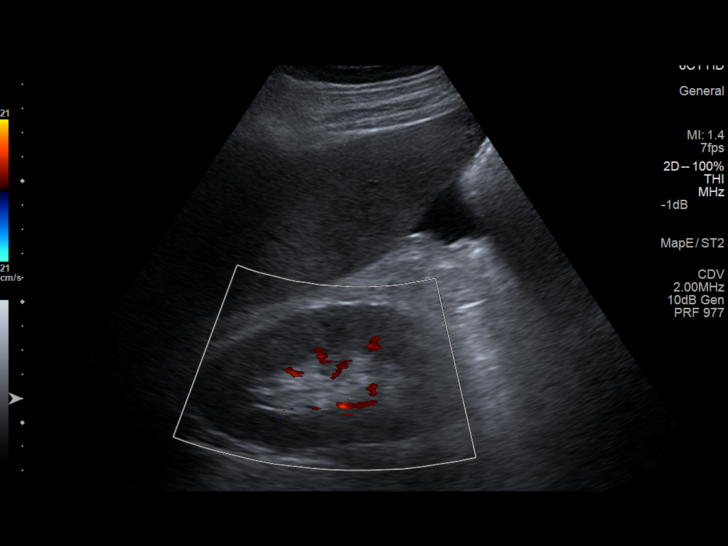
[im 6/31]
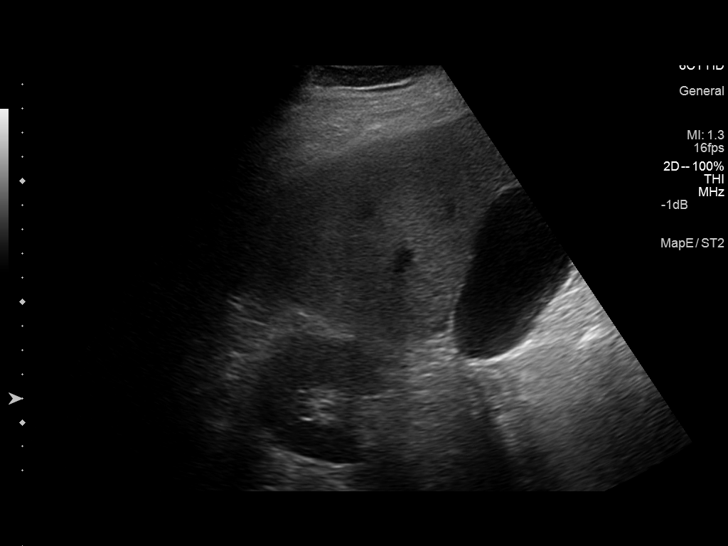
[im 8/31]
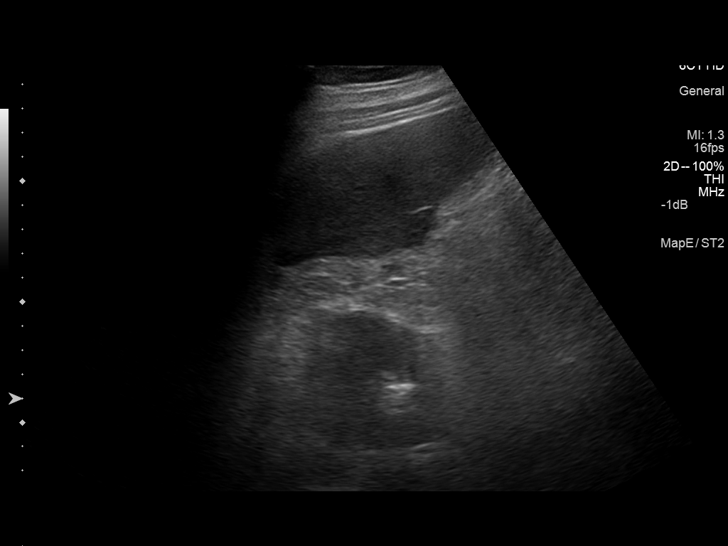
[im 11/31]
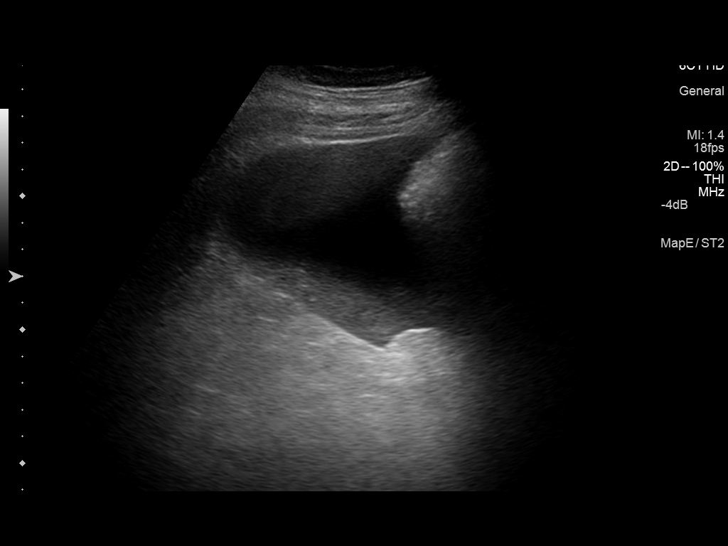
[im 12/31]
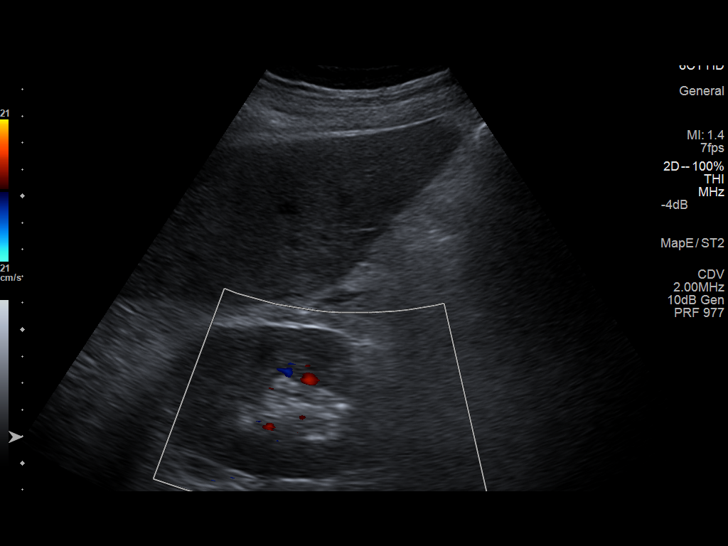
[im 14/31]
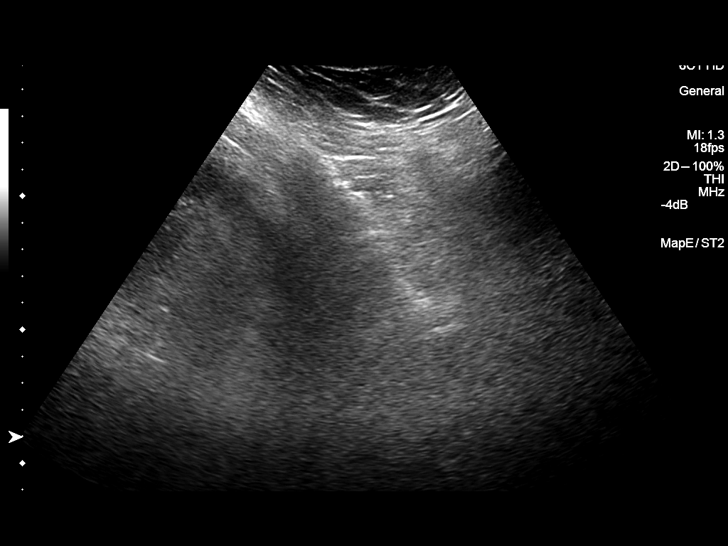
[im 17/31]
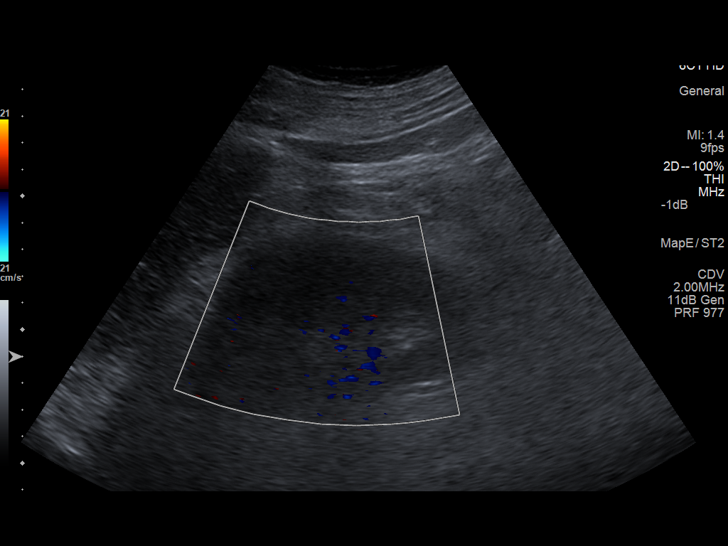
[im 19/31]
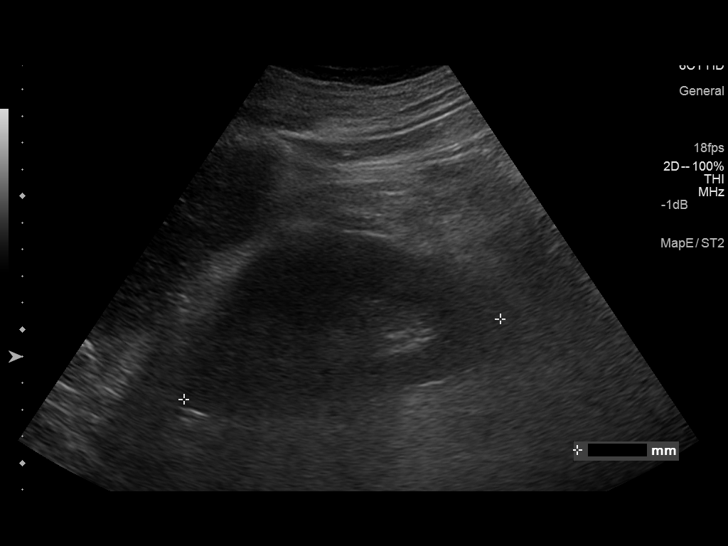
[im 21/31]
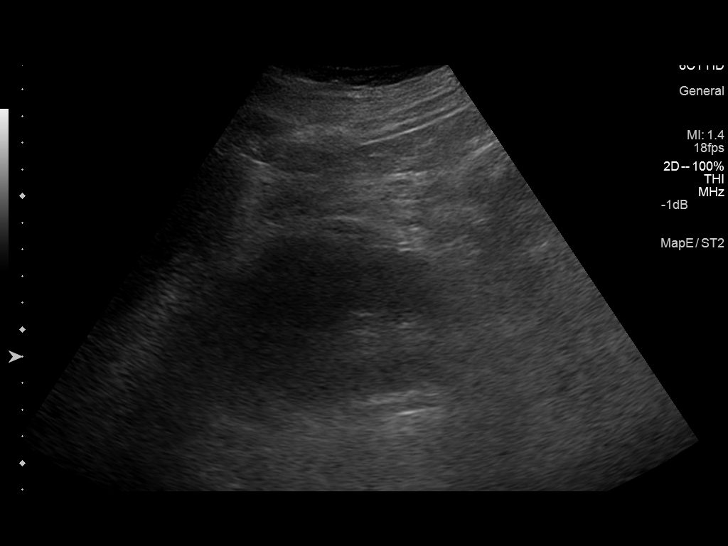
[im 23/31]
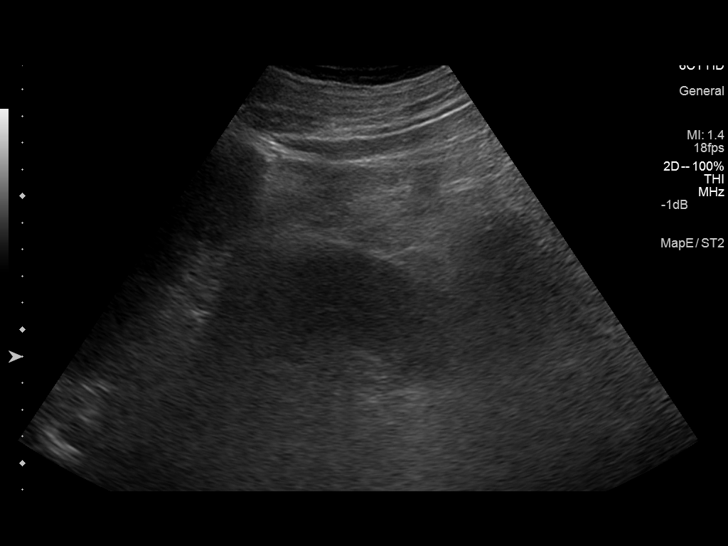
[im 26/31]
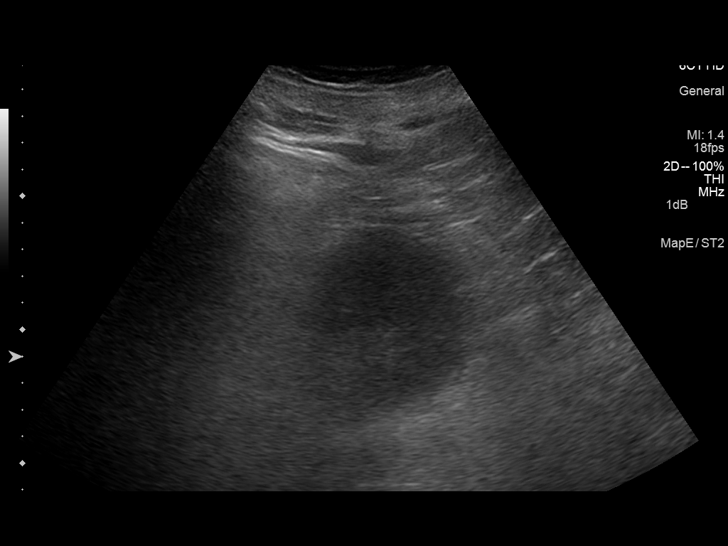
[im 28/31]
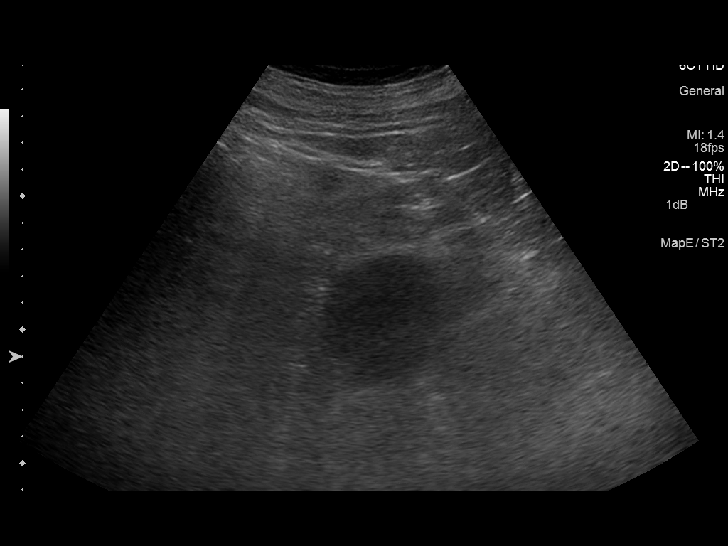
[im 31/31]
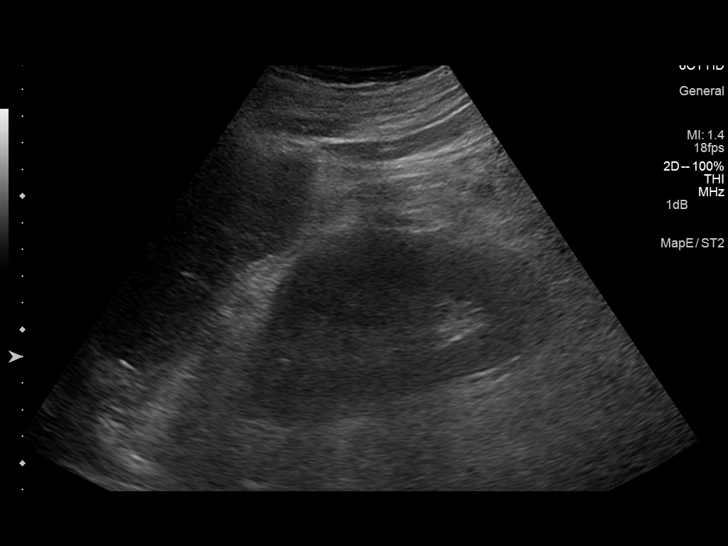

[14 of 25 positions shown; findings below may reference images not displayed]

FINDINGS: Right Kidney:

Length: 11.8 cm. Normal wound shape and echotexture. Previously
demonstrated 5 mm calculus in the lower pole. No hydronephrosis.

Left Kidney:

Length: 12.2 cm. Echogenicity within normal limits. No mass or
hydronephrosis visualized.

Bladder:

Not visualized with a Foley catheter in place.

A small amount of free peritoneal fluid is noted in the right upper
abdomen.
IMPRESSION: 1. No hydronephrosis.
2. 5 mm nonobstructing lower pole right renal calculus.
3. Small amount of ascites in the right upper quadrant of the
abdomen.

## 2015-12-08 IMAGING — CR DG CHEST 1V PORT
1 series · 1 of 1 positions shown · non-contrast
Comparison: Chest radiograph performed 07/10/2014

CLINICAL DATA: Shortness of breath.

EXAM:
PORTABLE CHEST - 1 VIEW

[AP]
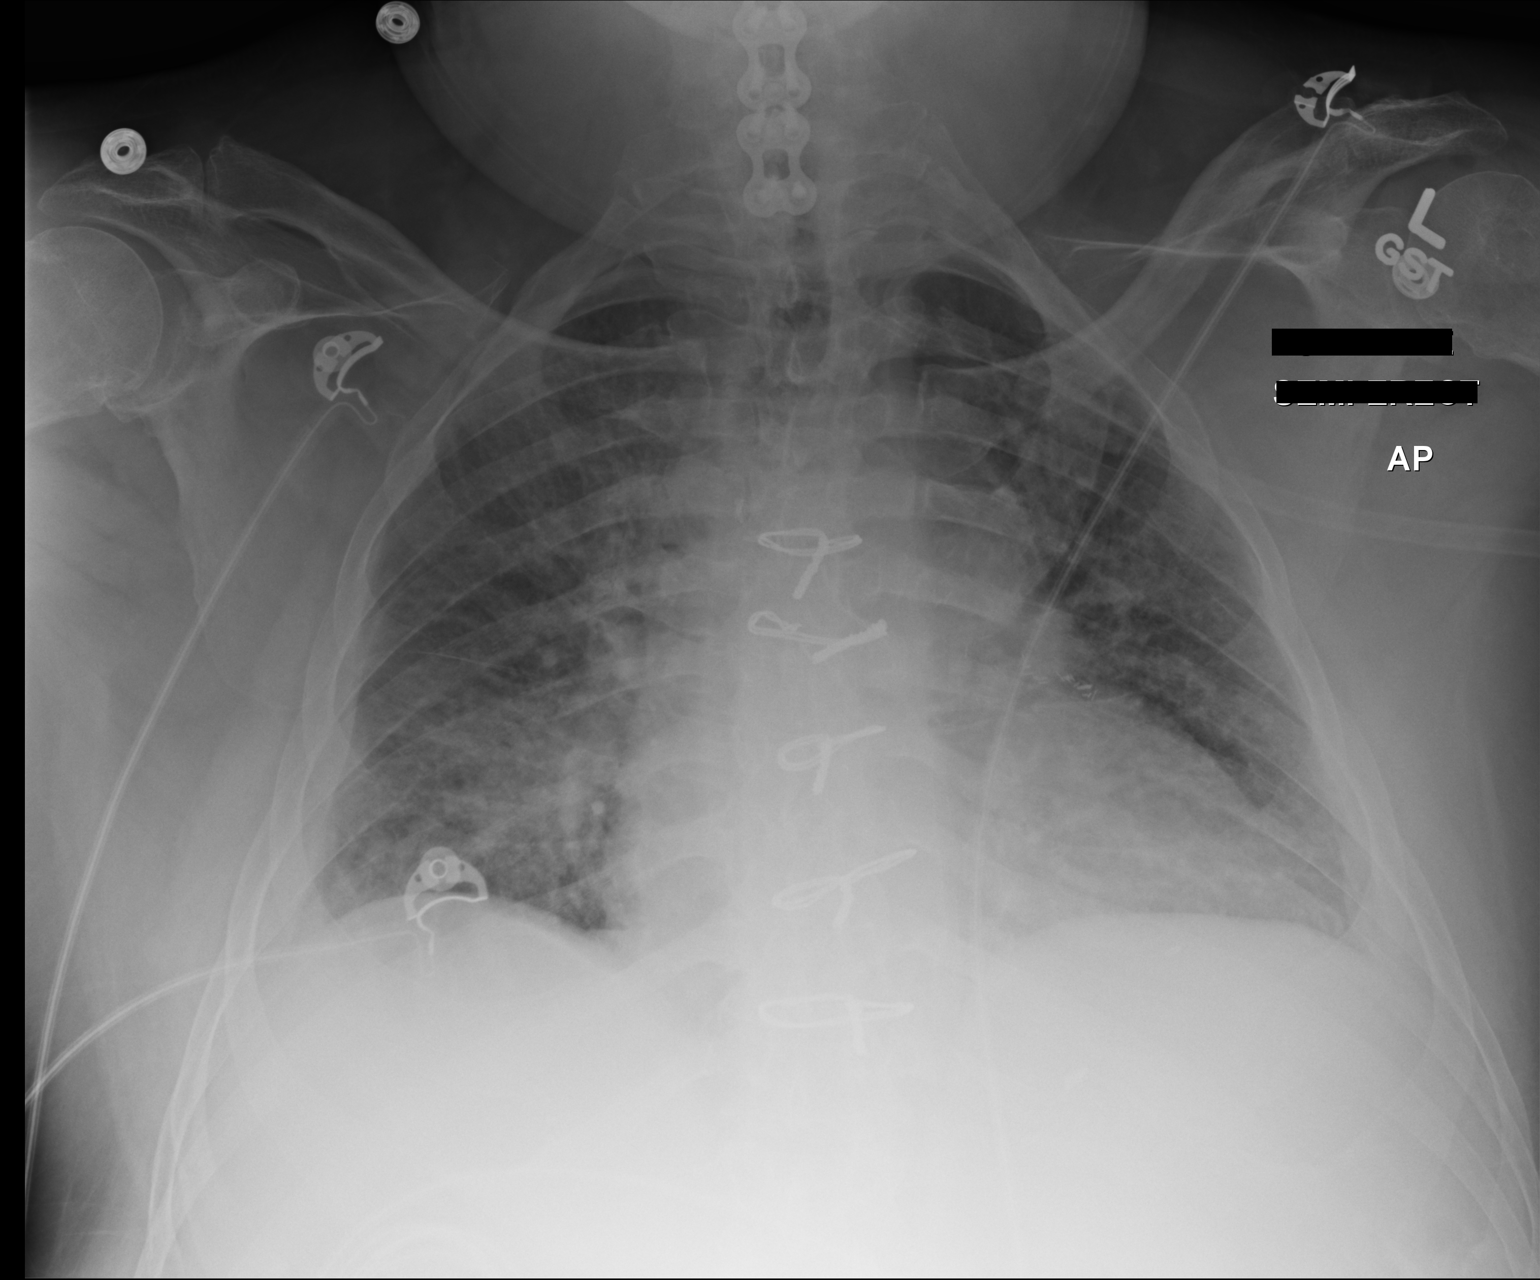

[1 of 1 positions shown; findings below may reference images not displayed]

FINDINGS: The lungs are well-aerated. Vascular congestion is noted. Bilateral
central airspace opacities may reflect pulmonary edema or possibly
multifocal pneumonia. Small bilateral pleural effusions are noted.
No pneumothorax is seen.

The cardiomediastinal silhouette is borderline enlarged. The patient
is status post median sternotomy. Cervical spinal fusion hardware is
noted. No acute osseous abnormalities are seen.
IMPRESSION: Vascular congestion and borderline cardiomegaly. Bilateral central
airspace opacities may reflect pulmonary edema or possibly
multifocal pneumonia. Small bilateral pleural effusions noted.

## 2015-12-12 IMAGING — CR DG CHEST 1V PORT
1 series · 1 of 1 positions shown · non-contrast
Comparison: 07/14/2014

CLINICAL DATA: Check endotracheal tube placement

EXAM:
PORTABLE CHEST - 1 VIEW

[AP]
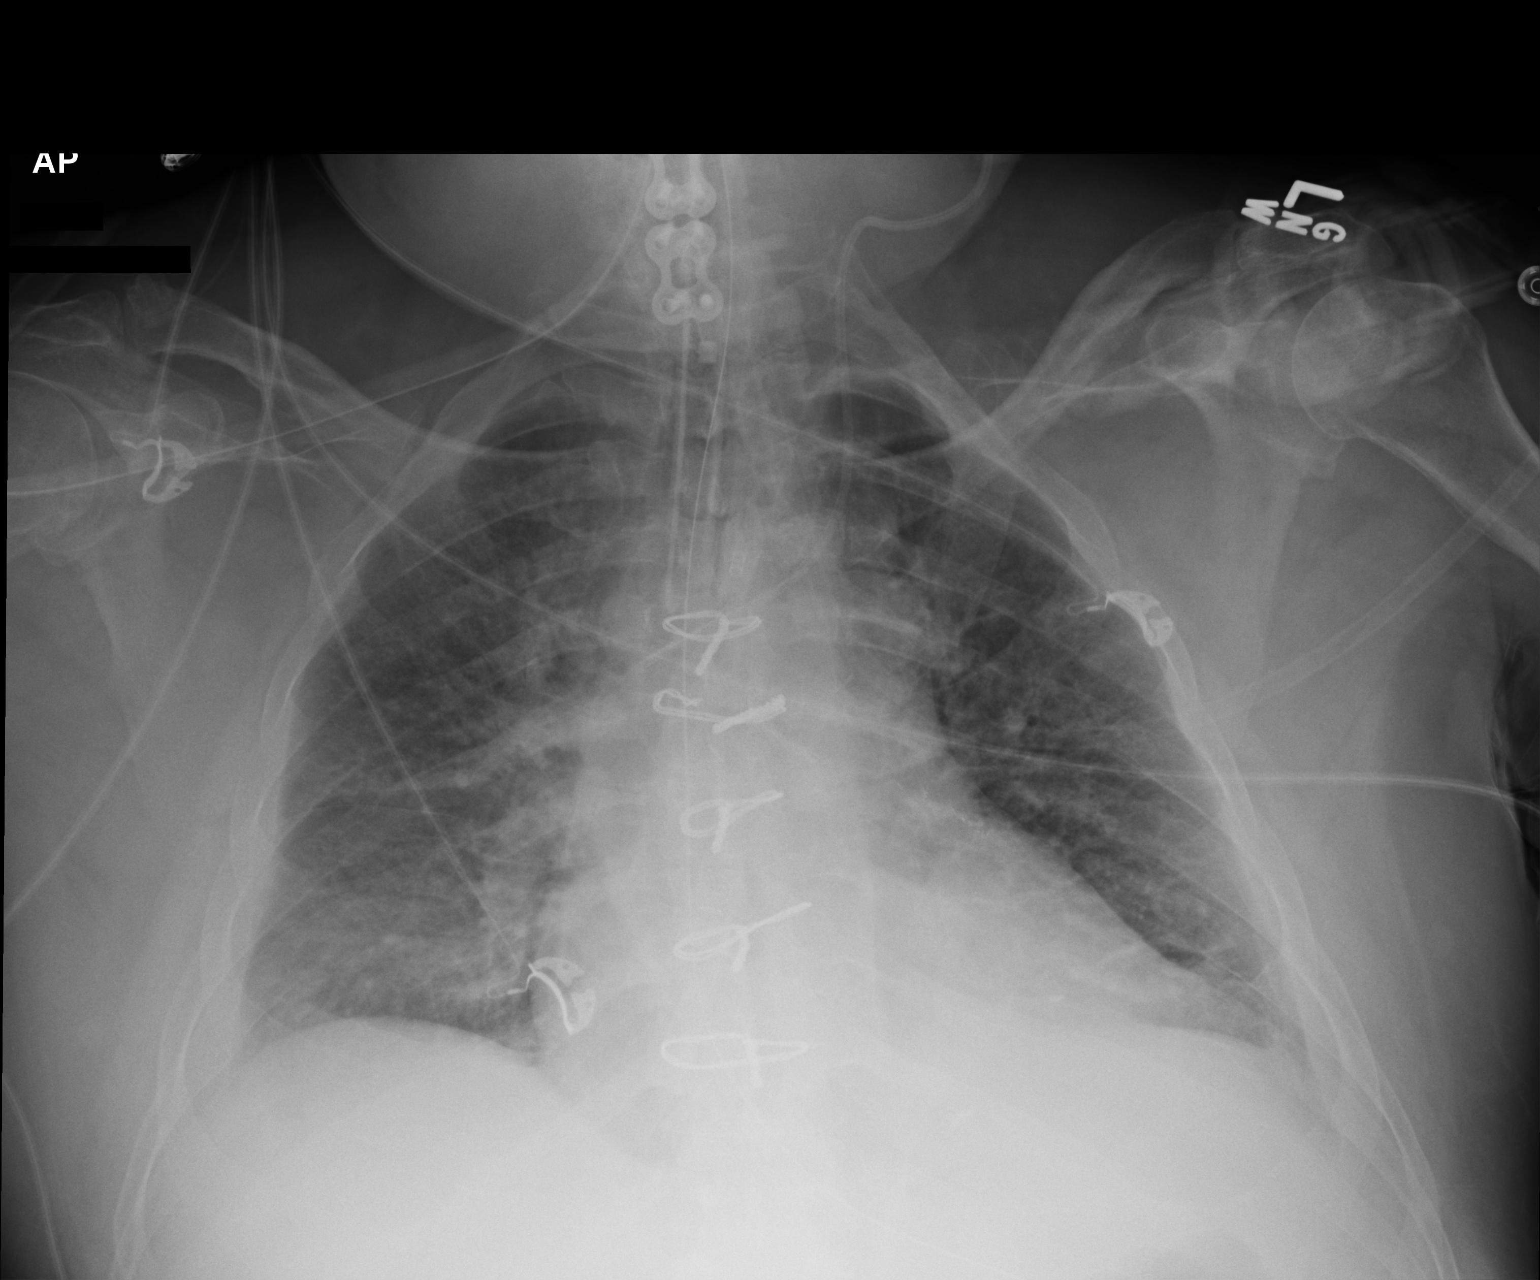

[1 of 1 positions shown; findings below may reference images not displayed]

FINDINGS: The endotracheal tube is again identified 12.5 mm above the carina.
A nasogastric catheter is noted within the stomach. The left central
venous line is again seen in the left innominate vein.

The cardiac shadow is stable. Lungs are well aerated bilaterally
without focal infiltrate. Mild central vascular congestion remains.
No sizable effusion is noted.
IMPRESSION: Endotracheal tube as described.  This could be withdrawn 1-2 cm.

The remainder the exam is stable in appearance.

These results will be called to the ordering clinician or
representative by the Radiologist Assistant, and communication
documented in the PACS or zVision Dashboard.

## 2015-12-12 IMAGING — CR DG CHEST 1V PORT
1 series · 1 of 1 positions shown · non-contrast
Comparison: Portable chest x-ray of earlier today at [DATE] a.m.

CLINICAL DATA: Status post endotracheal tube placement

EXAM:
PORTABLE CHEST - 1 VIEW

[AP]
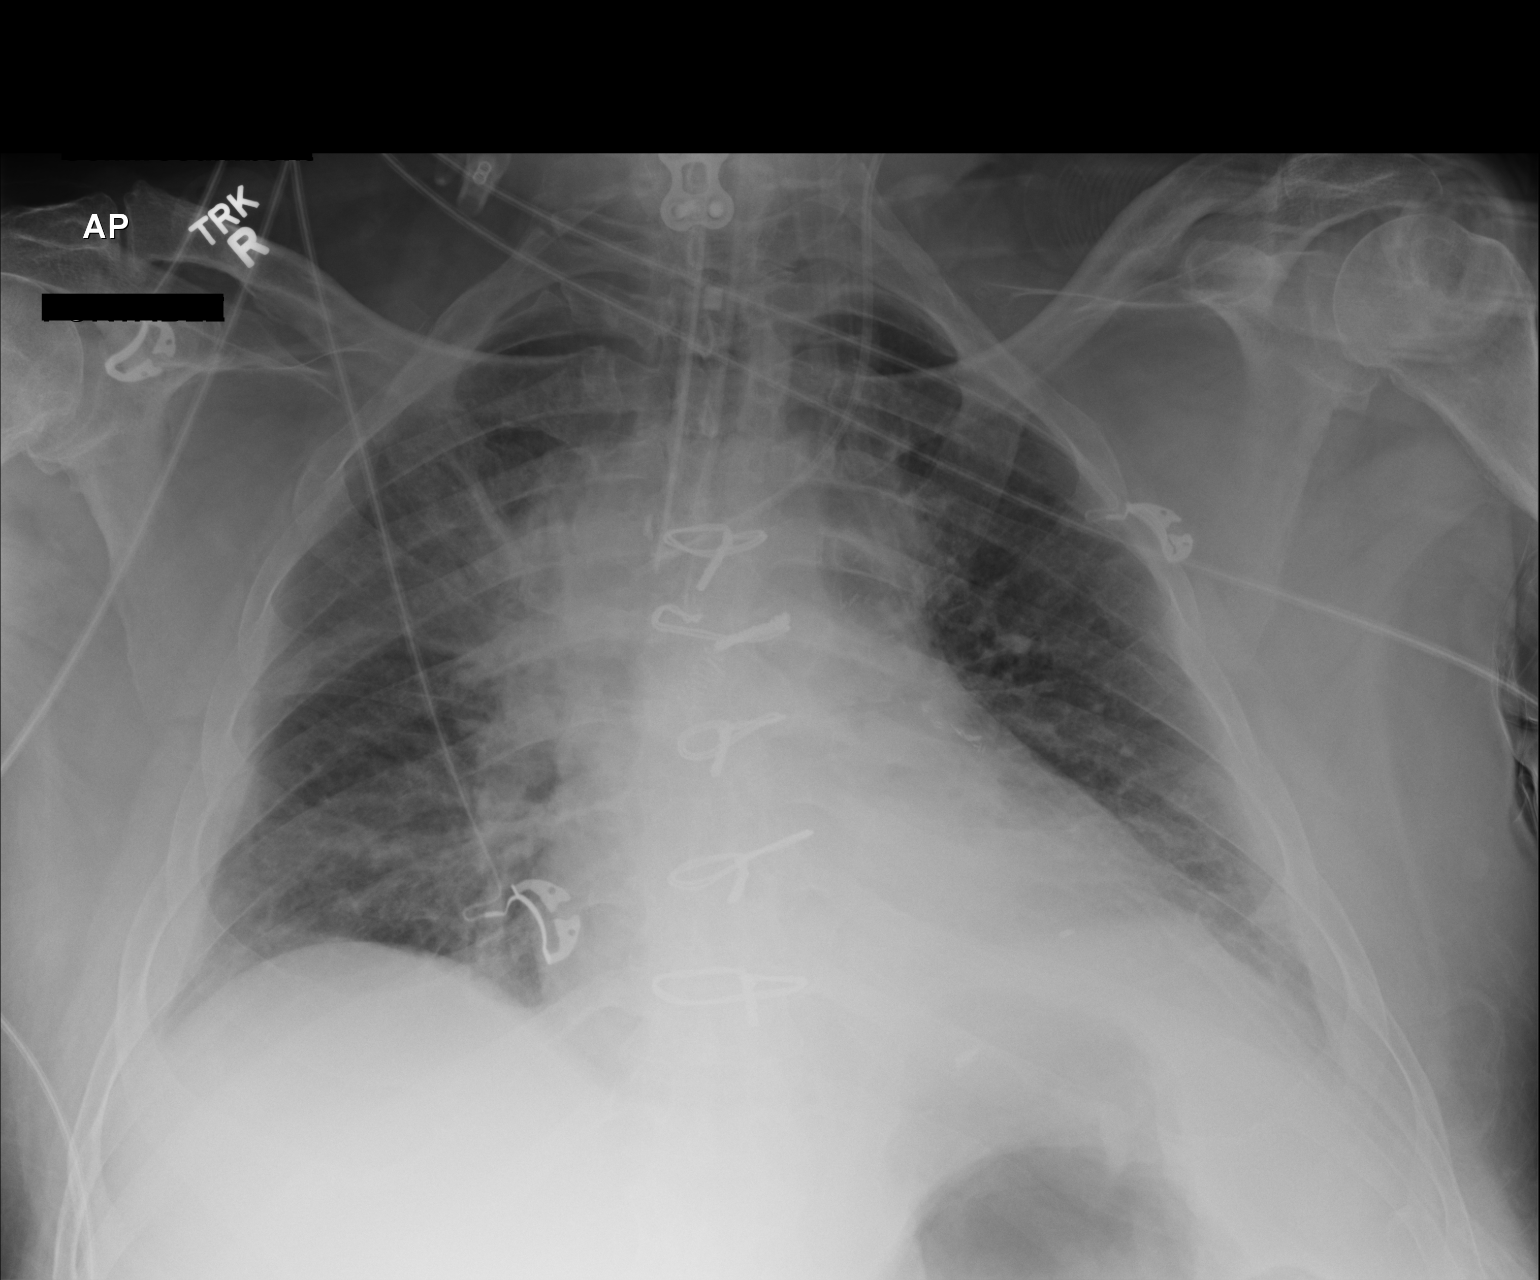

[1 of 1 positions shown; findings below may reference images not displayed]

FINDINGS: The endotracheal tube tip lies approximately 2.4 cm above the crotch
of the carina. The lungs are adequately inflated. The
cardiopericardial silhouette is mildly enlarged. The perihilar lung
markings on the right have become more conspicuous. The left
internal jugular venous catheter tip overlies the junction of the
right and left brachiocephalic veins. There is a small amount of
pleural fluid bilaterally.
IMPRESSION: The endotracheal tube tip lies approximately 2.4 cm above the crotch
of the carina. Withdrawal by 2 cm is recommended. Increased right
perihilar density may reflect pulmonary edema or subsegmental
atelectasis.

These results will be called to the ordering clinician or
representative by the Radiologist Assistant, and communication
documented in the PACS or zVision Dashboard.

## 2015-12-17 IMAGING — CR DG CHEST 1V PORT
1 series · 1 of 1 positions shown · non-contrast
Comparison: Portable exam 3422 hr compared to 07/19/2014

CLINICAL DATA: Intubation, endotracheal tube placement

EXAM:
PORTABLE CHEST - 1 VIEW

[AP]
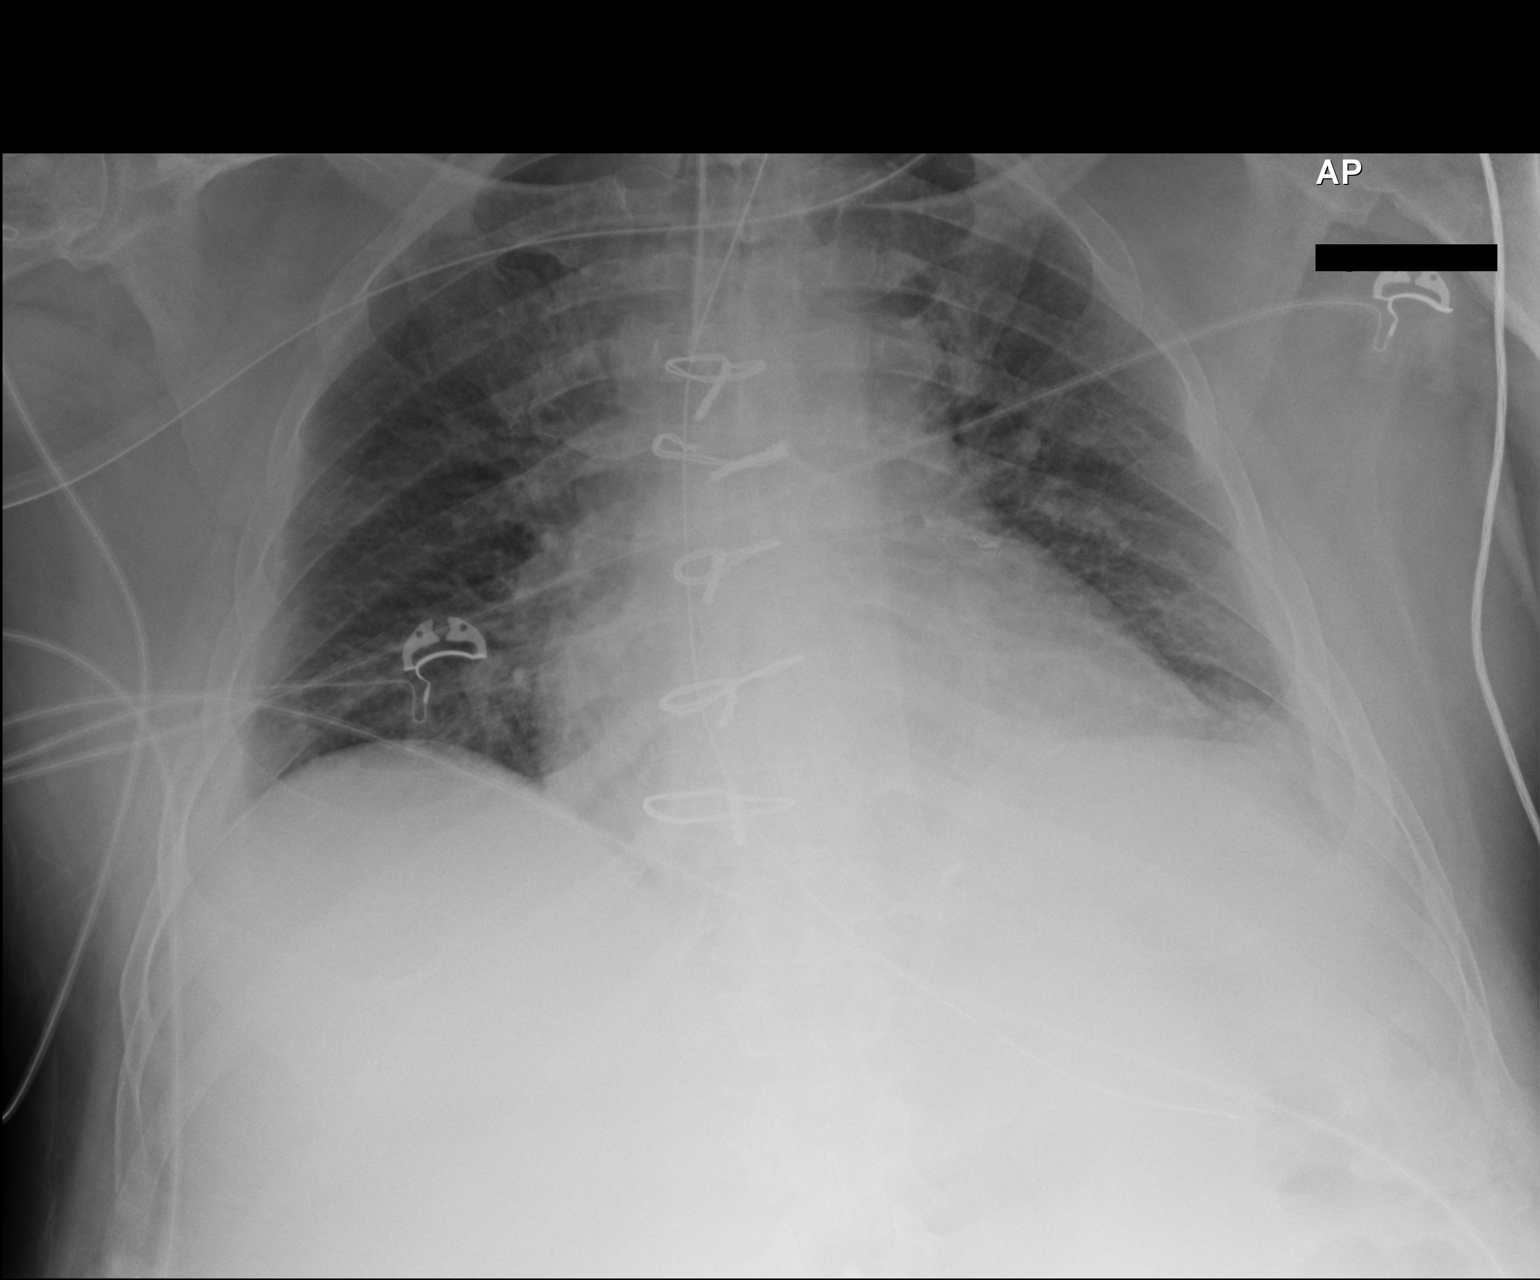

[1 of 1 positions shown; findings below may reference images not displayed]

FINDINGS: Tip of endotracheal tube projects approximately 1.5 cm above carina.

Nasogastric tube extends into stomach.

Enlargement of cardiac silhouette post median sternotomy.

Mediastinal contours normal.

Pulmonary vascular congestion.

Improved pulmonary edema.

Minimal pleural effusions bilaterally.

No pneumothorax.
IMPRESSION: Improved pulmonary edema.

Tip of endotracheal tube projects approximately 1.5 cm above carina.

## 2016-10-02 DIAGNOSIS — N179 Acute kidney failure, unspecified: Secondary | ICD-10-CM

## 2016-10-02 DIAGNOSIS — I509 Heart failure, unspecified: Secondary | ICD-10-CM

## 2016-10-02 DIAGNOSIS — R0902 Hypoxemia: Secondary | ICD-10-CM

## 2016-10-02 DIAGNOSIS — J9601 Acute respiratory failure with hypoxia: Secondary | ICD-10-CM | POA: Diagnosis not present

## 2016-10-02 DIAGNOSIS — M549 Dorsalgia, unspecified: Secondary | ICD-10-CM

## 2016-10-02 DIAGNOSIS — J9690 Respiratory failure, unspecified, unspecified whether with hypoxia or hypercapnia: Secondary | ICD-10-CM | POA: Diagnosis not present

## 2016-10-03 DIAGNOSIS — M549 Dorsalgia, unspecified: Secondary | ICD-10-CM | POA: Diagnosis not present

## 2016-10-03 DIAGNOSIS — R0902 Hypoxemia: Secondary | ICD-10-CM | POA: Diagnosis not present

## 2016-10-03 DIAGNOSIS — I509 Heart failure, unspecified: Secondary | ICD-10-CM | POA: Diagnosis not present

## 2016-10-03 DIAGNOSIS — N179 Acute kidney failure, unspecified: Secondary | ICD-10-CM | POA: Diagnosis not present

## 2016-10-04 DIAGNOSIS — R0902 Hypoxemia: Secondary | ICD-10-CM | POA: Diagnosis not present

## 2016-10-04 DIAGNOSIS — M549 Dorsalgia, unspecified: Secondary | ICD-10-CM | POA: Diagnosis not present

## 2016-10-04 DIAGNOSIS — I509 Heart failure, unspecified: Secondary | ICD-10-CM | POA: Diagnosis not present

## 2016-10-04 DIAGNOSIS — N179 Acute kidney failure, unspecified: Secondary | ICD-10-CM | POA: Diagnosis not present

## 2016-10-05 DIAGNOSIS — N179 Acute kidney failure, unspecified: Secondary | ICD-10-CM | POA: Diagnosis not present

## 2016-10-05 DIAGNOSIS — M549 Dorsalgia, unspecified: Secondary | ICD-10-CM | POA: Diagnosis not present

## 2016-10-05 DIAGNOSIS — R0902 Hypoxemia: Secondary | ICD-10-CM | POA: Diagnosis not present

## 2016-10-05 DIAGNOSIS — I509 Heart failure, unspecified: Secondary | ICD-10-CM | POA: Diagnosis not present

## 2016-10-06 DIAGNOSIS — N179 Acute kidney failure, unspecified: Secondary | ICD-10-CM | POA: Diagnosis not present

## 2016-10-06 DIAGNOSIS — R0902 Hypoxemia: Secondary | ICD-10-CM | POA: Diagnosis not present

## 2016-10-06 DIAGNOSIS — I509 Heart failure, unspecified: Secondary | ICD-10-CM | POA: Diagnosis not present

## 2016-10-06 DIAGNOSIS — M549 Dorsalgia, unspecified: Secondary | ICD-10-CM | POA: Diagnosis not present

## 2016-10-07 DIAGNOSIS — R0902 Hypoxemia: Secondary | ICD-10-CM | POA: Diagnosis not present

## 2016-10-07 DIAGNOSIS — I509 Heart failure, unspecified: Secondary | ICD-10-CM | POA: Diagnosis not present

## 2016-10-07 DIAGNOSIS — N179 Acute kidney failure, unspecified: Secondary | ICD-10-CM | POA: Diagnosis not present

## 2016-10-07 DIAGNOSIS — M549 Dorsalgia, unspecified: Secondary | ICD-10-CM | POA: Diagnosis not present

## 2016-10-28 DEATH — deceased
